# Patient Record
Sex: Male | Born: 1935 | Race: White | Hispanic: No | Smoking: Former smoker
Health system: Southern US, Community
[De-identification: ages and names within clinical notes are randomized; demographics above are authoritative.]

## PROBLEM LIST (undated history)

## (undated) DIAGNOSIS — Z87898 Personal history of other specified conditions: Secondary | ICD-10-CM

## (undated) DIAGNOSIS — Z8701 Personal history of pneumonia (recurrent): Secondary | ICD-10-CM

## (undated) DIAGNOSIS — Z8709 Personal history of other diseases of the respiratory system: Secondary | ICD-10-CM

## (undated) HISTORY — DX: Personal history of other diseases of the respiratory system: Z87.09

## (undated) HISTORY — DX: Personal history of other specified conditions: Z87.898

## (undated) HISTORY — PX: RIGHT HEART CATHETERIZATION WITH ADENOSINE STUDY: SHX6076

## (undated) HISTORY — PX: APPENDECTOMY: SHX54

## (undated) HISTORY — PX: HERNIA REPAIR: SHX51

## (undated) HISTORY — PX: CATARACT EXTRACTION: SUR2

## (undated) HISTORY — PX: UPPER GI ENDOSCOPY: SHX6162

## (undated) HISTORY — PX: COLONOSCOPY: SHX174

## (undated) HISTORY — DX: Personal history of pneumonia (recurrent): Z87.01

---

## 1999-05-25 DIAGNOSIS — W19XXXA Unspecified fall, initial encounter: Secondary | ICD-10-CM

## 1999-05-25 HISTORY — DX: Unspecified fall, initial encounter: W19.XXXA

## 2000-06-11 LAB — HM CT VIRTUAL COLONOSCOPY

## 2001-03-14 DIAGNOSIS — E7800 Pure hypercholesterolemia, unspecified: Secondary | ICD-10-CM

## 2001-03-14 HISTORY — DX: Pure hypercholesterolemia, unspecified: E78.00

## 2003-02-14 DIAGNOSIS — I10 Essential (primary) hypertension: Secondary | ICD-10-CM

## 2003-02-14 HISTORY — DX: Essential (primary) hypertension: I10

## 2003-04-19 DIAGNOSIS — I739 Peripheral vascular disease, unspecified: Secondary | ICD-10-CM | POA: Insufficient documentation

## 2003-08-06 DIAGNOSIS — I499 Cardiac arrhythmia, unspecified: Secondary | ICD-10-CM

## 2003-08-06 HISTORY — DX: Cardiac arrhythmia, unspecified: I49.9

## 2003-08-13 DIAGNOSIS — G4730 Sleep apnea, unspecified: Secondary | ICD-10-CM

## 2003-08-13 DIAGNOSIS — G4733 Obstructive sleep apnea (adult) (pediatric): Secondary | ICD-10-CM | POA: Insufficient documentation

## 2003-08-13 HISTORY — DX: Sleep apnea, unspecified: G47.30

## 2004-12-08 DIAGNOSIS — C4490 Unspecified malignant neoplasm of skin, unspecified: Secondary | ICD-10-CM | POA: Insufficient documentation

## 2004-12-29 DIAGNOSIS — L409 Psoriasis, unspecified: Secondary | ICD-10-CM

## 2004-12-29 HISTORY — DX: Psoriasis, unspecified: L40.9

## 2005-03-19 DIAGNOSIS — N289 Disorder of kidney and ureter, unspecified: Secondary | ICD-10-CM

## 2005-03-19 HISTORY — DX: Disorder of kidney and ureter, unspecified: N28.9

## 2015-10-24 DIAGNOSIS — M19019 Primary osteoarthritis, unspecified shoulder: Secondary | ICD-10-CM

## 2015-10-24 HISTORY — DX: Primary osteoarthritis, unspecified shoulder: M19.019

## 2017-08-06 DIAGNOSIS — J320 Chronic maxillary sinusitis: Secondary | ICD-10-CM

## 2017-08-06 HISTORY — DX: Chronic maxillary sinusitis: J32.0

## 2019-05-15 DIAGNOSIS — G3184 Mild cognitive impairment, so stated: Secondary | ICD-10-CM

## 2019-05-15 HISTORY — DX: Mild cognitive impairment of uncertain or unknown etiology: G31.84

## 2019-05-25 DIAGNOSIS — D62 Acute posthemorrhagic anemia: Secondary | ICD-10-CM

## 2019-05-25 HISTORY — DX: Acute posthemorrhagic anemia: D62

## 2019-05-30 DIAGNOSIS — I255 Ischemic cardiomyopathy: Secondary | ICD-10-CM

## 2019-05-30 DIAGNOSIS — R55 Syncope and collapse: Secondary | ICD-10-CM

## 2019-05-30 HISTORY — DX: Syncope and collapse: R55

## 2019-05-30 HISTORY — DX: Ischemic cardiomyopathy: I25.5

## 2019-06-03 DIAGNOSIS — I214 Non-ST elevation (NSTEMI) myocardial infarction: Secondary | ICD-10-CM | POA: Insufficient documentation

## 2019-06-03 HISTORY — DX: Non-ST elevation (NSTEMI) myocardial infarction: I21.4

## 2019-07-27 DIAGNOSIS — I2583 Coronary atherosclerosis due to lipid rich plaque: Secondary | ICD-10-CM

## 2019-07-27 DIAGNOSIS — I2510 Atherosclerotic heart disease of native coronary artery without angina pectoris: Secondary | ICD-10-CM

## 2019-07-27 HISTORY — DX: Coronary atherosclerosis due to lipid rich plaque: I25.83

## 2019-07-27 HISTORY — DX: Atherosclerotic heart disease of native coronary artery without angina pectoris: I25.10

## 2019-11-02 DIAGNOSIS — Z85828 Personal history of other malignant neoplasm of skin: Secondary | ICD-10-CM

## 2019-11-02 HISTORY — DX: Personal history of other malignant neoplasm of skin: Z85.828

## 2020-04-30 DIAGNOSIS — R21 Rash and other nonspecific skin eruption: Secondary | ICD-10-CM

## 2020-04-30 HISTORY — DX: Rash and other nonspecific skin eruption: R21

## 2020-08-07 DIAGNOSIS — K222 Esophageal obstruction: Secondary | ICD-10-CM

## 2020-08-07 HISTORY — DX: Esophageal obstruction: K22.2

## 2020-09-26 DIAGNOSIS — R6520 Severe sepsis without septic shock: Secondary | ICD-10-CM | POA: Insufficient documentation

## 2020-09-26 DIAGNOSIS — D649 Anemia, unspecified: Secondary | ICD-10-CM

## 2020-09-26 DIAGNOSIS — A419 Sepsis, unspecified organism: Secondary | ICD-10-CM | POA: Insufficient documentation

## 2020-09-26 DIAGNOSIS — J9601 Acute respiratory failure with hypoxia: Secondary | ICD-10-CM

## 2020-09-26 HISTORY — DX: Anemia, unspecified: D64.9

## 2020-09-26 HISTORY — DX: Sepsis, unspecified organism: A41.9

## 2020-09-26 HISTORY — DX: Acute respiratory failure with hypoxia: J96.01

## 2020-09-26 HISTORY — DX: Sepsis, unspecified organism: R65.20

## 2020-11-08 ENCOUNTER — Ambulatory Visit: Payer: Self-pay | Admitting: Orthopedic Surgery

## 2020-11-12 ENCOUNTER — Ambulatory Visit: Payer: Medicare Other | Admitting: Orthopedic Surgery

## 2020-11-12 ENCOUNTER — Other Ambulatory Visit: Payer: Self-pay

## 2020-11-12 ENCOUNTER — Encounter: Payer: Self-pay | Admitting: Orthopedic Surgery

## 2020-11-12 ENCOUNTER — Ambulatory Visit (INDEPENDENT_AMBULATORY_CARE_PROVIDER_SITE_OTHER): Payer: Medicare Other | Admitting: Orthopedic Surgery

## 2020-11-12 VITALS — BP 130/50 | HR 62 | Temp 97.5°F | Resp 20 | Ht 68.0 in | Wt 172.0 lb

## 2020-11-12 DIAGNOSIS — D649 Anemia, unspecified: Secondary | ICD-10-CM | POA: Diagnosis not present

## 2020-11-12 DIAGNOSIS — R748 Abnormal levels of other serum enzymes: Secondary | ICD-10-CM

## 2020-11-12 DIAGNOSIS — D500 Iron deficiency anemia secondary to blood loss (chronic): Secondary | ICD-10-CM

## 2020-11-12 DIAGNOSIS — L2084 Intrinsic (allergic) eczema: Secondary | ICD-10-CM

## 2020-11-12 DIAGNOSIS — I2510 Atherosclerotic heart disease of native coronary artery without angina pectoris: Secondary | ICD-10-CM | POA: Diagnosis not present

## 2020-11-12 DIAGNOSIS — C4441 Basal cell carcinoma of skin of scalp and neck: Secondary | ICD-10-CM

## 2020-11-12 DIAGNOSIS — I2583 Coronary atherosclerosis due to lipid rich plaque: Secondary | ICD-10-CM

## 2020-11-12 DIAGNOSIS — I4891 Unspecified atrial fibrillation: Secondary | ICD-10-CM

## 2020-11-12 DIAGNOSIS — E039 Hypothyroidism, unspecified: Secondary | ICD-10-CM

## 2020-11-12 DIAGNOSIS — R351 Nocturia: Secondary | ICD-10-CM

## 2020-11-12 DIAGNOSIS — I10 Essential (primary) hypertension: Secondary | ICD-10-CM | POA: Diagnosis not present

## 2020-11-12 DIAGNOSIS — G4701 Insomnia due to medical condition: Secondary | ICD-10-CM

## 2020-11-12 DIAGNOSIS — N401 Enlarged prostate with lower urinary tract symptoms: Secondary | ICD-10-CM

## 2020-11-12 DIAGNOSIS — G3184 Mild cognitive impairment, so stated: Secondary | ICD-10-CM

## 2020-11-12 DIAGNOSIS — I110 Hypertensive heart disease with heart failure: Secondary | ICD-10-CM

## 2020-11-12 DIAGNOSIS — R2681 Unsteadiness on feet: Secondary | ICD-10-CM

## 2020-11-12 NOTE — Progress Notes (Signed)
Careteam: Patient Care Team: Elmon Else, MD as Consulting Physician (Dermatology) Little Ishikawa, MD as Consulting Physician (Cardiology)  Seen by: Hazle Nordmann, AGNP-C  PLACE OF SERVICE:  [facility]  Advanced Directive information    Allergies  Allergen Reactions  . Lisinopril Swelling  . Telmisartan Hives  . Atorvastatin Rash  . Sulfa Antibiotics Rash    No chief complaint on file.    HPI: Patient is a 87 y.o. male seen today to establish at [facility]. Records have been requested from previous PCP.   Currently lives at [facility]. Moved here from [place], [place] two weeks prior. Lived in [place] his whole life. Retired Teacher, early years/pre. Widowed ten years ago.Two children, son and daughter. Daughter lives close, son out of state.   NSTEMI with acute decompensated heart failure- Hospitalized at [facility] 12/22 for acute decompensated heart failure and acute hypoxic respiratory failure. He began having chest pain 2 days prior to hospitalization. Daughter states his poor kidney function contributed to heart failure. He was discharged to snf 01/14 in [place]. Remains on Plavix, statin, beta blocker, and arb. Receiving 40 mg lasix for chf. New cardiologist is Dr. Nathaniel Man with [facility]. Will f/u with him in a few weeks. Denies chest pain or sob. Weighs himself daily (average weight 170 lbs). Limits sodium in diet-[facility] serves his meals.   CAD- followed by Dr. Clarene Critchley. 05/2019 he had stents placed to LAD and distal circumflex. EF 45 % after stent placement.   Hypertension- Denies dizziness or blurred vision. Checks blood pressure on his own, [employer] also checks .    Mild Cognitive impairment- Has trouble recalling words or answers. Describes it as delayed answering. He can eventually state the correct answer, but it can be up to a day later. No behavioral issues. [facility] now gives him his medicines and prepares  his meals.   Basal cell carcinoma- had areas on his forehead frozen off within the last few weeks. No complications, healing well. Scheduled to see new dermatologist, Dr. Elmon Else end of Feb.   Anemia- diagnosed during hospitalization, was found to be 7.5 on admission. He was given 2 PRBC and hgb increased to 9.4 at discharge. No recent bleeding, continues to be on plavix and eliquis.   GERD- no heartburn. Takes protonix daily. Avoids food triggers when he can.   Dysphagia- He disagrees to this diagnosis. He has never aspirated. States he has trouble swallowing 8-10 tablets all at once. No issues eating or drinking fluids. Remains on a regular diet with thin liquids.   Falls- unsteady gait. Uses a rollator. Initially started using walker in 05/2019 after first heart attack and catherization. Last fall 3 weeks ago. Stumbled against side of stove and bruised rib cage. Alarms throughout apartment and he also wears a life alert.   Constipation- mostly going everyday. Straining at times. Requesting colace.   Eczema- has had it for years. Mainly on his arms and legs. Applies lotion daily. Also used triamcinolone and halobetasol creams. Requesting interventions to help with skin.   Knee and shoulder pain- Right and left shoulder pain ongoing for years. Has seen orthopedist in past and they recommended cortisone injections and surgery. Does not want orthopedic surgery. He plans to just deal with the pain. PT eval to be done this week. Takes tylenol prn for pain. Does not think it is effective.   Hypothyroidism- stable with medication. Denies fatigue.   Insomnia- Goes to bed  around 1030 then wakes up about 2 hours later. He will play solitaire cards for awhile and eventually falls back asleep. Unsure if bedtime urination is waking him up, admits to nocturia. He has a history of BPH.   BPH- Frequent urination throughout the day. Takes saw palmetto. Admits to nocturia, cannot recall how many times a  night.   Schatzki's ring- In fall of 2021, upper endoscopy confirmed diagnosis. Hematology consult recommended.   Anemia- history of chronic GI blood loss. Takes iron supplement daily. Hematology consult recommended 10/21.   Colonscopy - last done in 07/2020  Eye exam- Due to have his eyes checked. Wears glasses. Cataracts surgery in past  Stopped driving after last hospitalization.   Dental exam- Denies dental issues. Gets regular checkups/cleanings.   No hearing issues     Review of Systems:  Review of Systems  Constitutional: Positive for malaise/fatigue and weight loss.       Cold intolerance  HENT:       Allergies, loss of smell  Eyes:       Glasses  Respiratory: Positive for shortness of breath. Negative for cough and wheezing.   Cardiovascular: Negative for chest pain and leg swelling.  Gastrointestinal: Positive for constipation and melena.  Genitourinary: Positive for frequency.  Musculoskeletal: Positive for falls, joint pain and myalgias.  Skin: Positive for itching and rash.  Neurological: Positive for weakness. Negative for dizziness and headaches.       Loss of balance  Endo/Heme/Allergies: Bruises/bleeds easily.  Psychiatric/Behavioral: Positive for memory loss.    Past Medical History:  Diagnosis Date  . Acute anemia 09/26/2020  . Anemia due to acute blood loss 05/25/2019  . Chronic maxillary sinusitis 08/06/2017  . Coronary artery disease due to lipid rich plaque 07/27/2019  . Essential hypertension 02/14/2003  . Fainting 05/30/2019  . Fall 05/25/1999  . High cholesterol 03/14/2001  . History of basal cell carcinoma 11/02/2019  . History of pneumonia   . History of snoring   . History of upper respiratory infection   . Irregular heartbeat 08/06/2003  . Ischemic cardiomyopathy 05/30/2019  . Mild cognitive impairment 05/15/2019  . NSTEMI (non-ST elevated myocardial infarction) ([place]) 06/03/2019  . Poor renal function 03/19/2005  . Primary  osteoarthritis of shoulder 10/24/2015  . Psoriasis 12/29/2004  . Rash and nonspecific skin eruption 04/30/2020  . Schatzki's ring 08/07/2020  . Sepsis with acute hypoxic respiratory failure ([place]) 09/26/2020  . Sleep apnea 08/13/2003   No past surgical history on file. Social History:   reports that he quit smoking about 61 years ago. His smoking use included cigarettes. He smoked 0.25 packs per day. He has never used smokeless tobacco. He reports current alcohol use of about 3.0 standard drinks of alcohol per week. He reports that he does not use drugs.  Family History  Problem Relation Age of Onset  . Lung disease Mother   . Heart attack Father   . Brain cancer Brother   . Breast cancer Daughter   . Celiac disease Daughter   . Diabetes Mellitus I Son     Medications: Patient's Medications  New Prescriptions   No medications on file  Previous Medications   ACETAMINOPHEN (TYLENOL) 500 MG TABLET    Take 500 mg by mouth every 6 (six) hours as needed.   ALBUTEROL (VENTOLIN HFA) 108 (90 BASE) MCG/ACT INHALER    Inhale 2 puffs into the lungs every 6 (six) hours as needed for wheezing or shortness of breath.   AMIODARONE (PACERONE)  200 MG TABLET    Take 200 mg by mouth daily.   APIXABAN (ELIQUIS) 5 MG TABS TABLET    Take 5 mg by mouth 2 (two) times daily.   BENZONATATE (TESSALON) 100 MG CAPSULE    Take by mouth 3 (three) times daily as needed for cough.   CALCIUM CITRATE PO    Take 1 tablet by mouth 2 (two) times daily.   CLOPIDOGREL (PLAVIX) 75 MG TABLET    Take 75 mg by mouth daily.   CYPROHEPTADINE (PERIACTIN) 4 MG TABLET    Take 4 mg by mouth in the morning, at noon, and at bedtime.   FERROUS SULFATE 325 (65 FE) MG TABLET    Take 325 mg by mouth daily.   FEXOFENADINE-PSEUDOEPHEDRINE (ALLEGRA-D 24) 180-240 MG 24 HR TABLET    Take 1 tablet by mouth daily as needed.   FLUTICASONE (FLONASE) 50 MCG/ACT NASAL SPRAY    Place 2 sprays into both nostrils 2 (two) times daily.   FLUTICASONE  FUROATE-VILANTEROL (BREO ELLIPTA) 100-25 MCG/INH AEPB    Inhale 1 puff into the lungs daily.   FUROSEMIDE (LASIX) 40 MG TABLET    Take 40 mg by mouth in the morning.   HALOBETASOL (ULTRAVATE) 0.05 % CREAM    Apply 1 application topically 2 (two) times daily.   IPRATROPIUM (ATROVENT HFA) 17 MCG/ACT INHALER    Inhale 2 puffs into the lungs in the morning, at noon, in the evening, and at bedtime.   LEVOTHYROXINE (SYNTHROID) 25 MCG TABLET    Take 25 mcg by mouth daily.   LOSARTAN (COZAAR) 50 MG TABLET    Take 50 mg by mouth in the morning.   METOPROLOL SUCCINATE (TOPROL-XL) 50 MG 24 HR TABLET    Take 50 mg by mouth daily. Take with or immediately following a meal.   MONTELUKAST (SINGULAIR) 10 MG TABLET    Take 10 mg by mouth daily.   MULTIPLE VITAMIN (MULTIVITAMIN PO)    Take 1 tablet by mouth daily.   PANTOPRAZOLE (PROTONIX) 40 MG TABLET    Take 40 mg by mouth daily.   ROSUVASTATIN (CRESTOR) 20 MG TABLET    Take 20 mg by mouth daily.   SAW PALMETTO, SERENOA REPENS, (SAW PALMETTO FRUIT PO)    Take 450 mg by mouth 2 (two) times daily.   TRIAMCINOLONE (KENALOG) 0.1 %    Apply 1 application topically as needed.  Modified Medications   No medications on file  Discontinued Medications   No medications on file    Physical Exam:  There were no vitals filed for this visit. There is no height or weight on file to calculate BMI. Wt Readings from Last 3 Encounters:  No data found for Wt    Physical Exam Vitals reviewed.  Constitutional:      General: He is not in acute distress. HENT:     Head: Normocephalic.  Cardiovascular:     Rate and Rhythm: Normal rate and regular rhythm.     Pulses: Normal pulses.     Heart sounds: Normal heart sounds. No murmur heard.   Pulmonary:     Effort: Pulmonary effort is normal. No respiratory distress.     Breath sounds: Normal breath sounds. No wheezing.  Abdominal:     General: Abdomen is flat. Bowel sounds are normal.     Palpations: Abdomen is soft.   Musculoskeletal:     Cervical back: Normal range of motion.     Right lower leg: Edema present.  Left lower leg: Edema present.     Comments: Non-pitting  Lymphadenopathy:     Cervical: No cervical adenopathy.  Skin:    General: Skin is warm and dry.     Capillary Refill: Capillary refill takes less than 2 seconds.  Neurological:     General: No focal deficit present.     Mental Status: He is alert and oriented to person, place, and time.     Motor: Weakness present.     Gait: Gait abnormal.     Comments: walker  Psychiatric:        Mood and Affect: Mood normal.        Behavior: Behavior normal.    Labs reviewed: Basic Metabolic Panel: No results for input(s): NA, K, CL, CO2, GLUCOSE, BUN, CREATININE, CALCIUM, MG, PHOS, TSH in the last 8760 hours. Liver Function Tests: No results for input(s): AST, ALT, ALKPHOS, BILITOT, PROT, ALBUMIN in the last 8760 hours. No results for input(s): LIPASE, AMYLASE in the last 8760 hours. No results for input(s): AMMONIA in the last 8760 hours. CBC: No results for input(s): WBC, NEUTROABS, HGB, HCT, MCV, PLT in the last 8760 hours. Lipid Panel: No results for input(s): CHOL, HDL, LDLCALC, TRIG, CHOLHDL, LDLDIRECT in the last 8760 hours. TSH: No results for input(s): TSH in the last 8760 hours. A1C: No results found for: HGBA1C   Assessment/Plan 1. Acquired hypothyroidism - TSH 14.35 - levothyroxine increased to 50 mcg po daily on empty stomach - recheck TSH in 4-6 weeks  2. Low hemoglobin - improving after hospitalization, history of GI blood loss - CBC with Differential/Platelets- resulted 10.9 - recheck in 4 weeks  3. Primary hypertension - bp at goal  - continue losartan 50 mg po daily - continue heart healthy diet - CMP   4. Coronary artery disease due to lipid rich plaque - history of stent placement 05/2019 - last echo with EF 45% - cont metoprolol and statin  5. Mild cognitive impairment - alert and oriented x  4 today - reports slow responsiveness - MMSE next visit  6. Basal cell carcinoma (BCC) of scalp - lesions on scale removed 04/2020 - cont to monitor  7. Iron deficiency anemia due to chronic blood loss - hgb 10.9 today - cbc/diff in 4 weeks  8. Unsteady gait - continue to walk with rollator - continue PT/OT  9. Atrial fibrillation, unspecified type ([place]) - rate controlled - continue metoprolol and amiodarone - continue eliquis for dvt prophylaxis  10. Hypertensive heart disease with heart failure (HCC) - weight stable, no sob, BLE non-pitting edema - continue lasix  - continue daily weights  11. Insomnia due to medical condition - history of BPH - advised cutting back on fluids 2 hours before bedtime  12. Benign prostatic hyperplasia with nocturia - few episodes of nocturia at night - advised cutting back on fluids prior to bedtime - no urinary retention - continue saw palmetto  13. Intrinsic eczema - U/V treatment 09/2020 - continue halobetasol and triamcinolone - advised to moisturize after showering, suggested cerave - may apply vasaline over moisturizer  - discontinued Allegra D due to cardiac history - continue allegra for itching - scheduled to see dermatologist in 1-2 months per daughter   I provided 65 minutes of face-to-face time during this encounter.      Next appt: 12/10/2020 Windell Moulding, [facility] [phone]

## 2020-11-13 ENCOUNTER — Other Ambulatory Visit: Payer: Self-pay | Admitting: Orthopedic Surgery

## 2020-11-13 DIAGNOSIS — E039 Hypothyroidism, unspecified: Secondary | ICD-10-CM

## 2020-11-13 DIAGNOSIS — N1831 Chronic kidney disease, stage 3a: Secondary | ICD-10-CM

## 2020-11-13 LAB — CBC WITH DIFFERENTIAL/PLATELET
Absolute Monocytes: 984 cells/uL — ABNORMAL HIGH (ref 200–950)
Basophils Absolute: 75 cells/uL (ref 0–200)
Basophils Relative: 0.7 %
Eosinophils Absolute: 1113 cells/uL — ABNORMAL HIGH (ref 15–500)
Eosinophils Relative: 10.4 %
HCT: 33.1 % — ABNORMAL LOW (ref 38.5–50.0)
Hemoglobin: 10.9 g/dL — ABNORMAL LOW (ref 13.2–17.1)
Lymphs Abs: 1766 cells/uL (ref 850–3900)
MCH: 32.4 pg (ref 27.0–33.0)
MCHC: 32.9 g/dL (ref 32.0–36.0)
MCV: 98.5 fL (ref 80.0–100.0)
MPV: 10.3 fL (ref 7.5–12.5)
Monocytes Relative: 9.2 %
Neutro Abs: 6762 cells/uL (ref 1500–7800)
Neutrophils Relative %: 63.2 %
Platelets: 325 10*3/uL (ref 140–400)
RBC: 3.36 10*6/uL — ABNORMAL LOW (ref 4.20–5.80)
RDW: 15.6 % — ABNORMAL HIGH (ref 11.0–15.0)
Total Lymphocyte: 16.5 %
WBC: 10.7 10*3/uL (ref 3.8–10.8)

## 2020-11-13 LAB — COMPREHENSIVE METABOLIC PANEL
AG Ratio: 1.4 (calc) (ref 1.0–2.5)
ALT: 13 U/L (ref 9–46)
AST: 16 U/L (ref 10–35)
Albumin: 4 g/dL (ref 3.6–5.1)
Alkaline phosphatase (APISO): 37 U/L (ref 35–144)
BUN/Creatinine Ratio: 10 (calc) (ref 6–22)
BUN: 16 mg/dL (ref 7–25)
CO2: 32 mmol/L (ref 20–32)
Calcium: 10 mg/dL (ref 8.6–10.3)
Chloride: 103 mmol/L (ref 98–110)
Creat: 1.6 mg/dL — ABNORMAL HIGH (ref 0.70–1.11)
Globulin: 2.8 g/dL (calc) (ref 1.9–3.7)
Glucose, Bld: 91 mg/dL (ref 65–99)
Potassium: 4.6 mmol/L (ref 3.5–5.3)
Sodium: 144 mmol/L (ref 135–146)
Total Bilirubin: 0.4 mg/dL (ref 0.2–1.2)
Total Protein: 6.8 g/dL (ref 6.1–8.1)

## 2020-11-13 LAB — TSH: TSH: 14.35 mIU/L — ABNORMAL HIGH (ref 0.40–4.50)

## 2020-11-13 MED ORDER — LEVOTHYROXINE SODIUM 50 MCG PO TABS: 50.0000 ug | ORAL_TABLET | Freq: Every day | ORAL | 0 refills | Status: DC

## 2020-11-17 DIAGNOSIS — I4891 Unspecified atrial fibrillation: Secondary | ICD-10-CM | POA: Insufficient documentation

## 2020-11-17 DIAGNOSIS — D500 Iron deficiency anemia secondary to blood loss (chronic): Secondary | ICD-10-CM | POA: Insufficient documentation

## 2020-11-17 DIAGNOSIS — D649 Anemia, unspecified: Secondary | ICD-10-CM | POA: Insufficient documentation

## 2020-11-17 DIAGNOSIS — E039 Hypothyroidism, unspecified: Secondary | ICD-10-CM | POA: Insufficient documentation

## 2020-11-17 DIAGNOSIS — G3184 Mild cognitive impairment, so stated: Secondary | ICD-10-CM | POA: Insufficient documentation

## 2020-11-17 DIAGNOSIS — I10 Essential (primary) hypertension: Secondary | ICD-10-CM | POA: Insufficient documentation

## 2020-11-17 DIAGNOSIS — N401 Enlarged prostate with lower urinary tract symptoms: Secondary | ICD-10-CM | POA: Insufficient documentation

## 2020-11-17 DIAGNOSIS — L2084 Intrinsic (allergic) eczema: Secondary | ICD-10-CM | POA: Insufficient documentation

## 2020-11-17 DIAGNOSIS — C4441 Basal cell carcinoma of skin of scalp and neck: Secondary | ICD-10-CM | POA: Insufficient documentation

## 2020-11-17 DIAGNOSIS — G4701 Insomnia due to medical condition: Secondary | ICD-10-CM | POA: Insufficient documentation

## 2020-11-17 DIAGNOSIS — R2681 Unsteadiness on feet: Secondary | ICD-10-CM | POA: Insufficient documentation

## 2020-11-17 DIAGNOSIS — I2510 Atherosclerotic heart disease of native coronary artery without angina pectoris: Secondary | ICD-10-CM | POA: Insufficient documentation

## 2020-11-17 DIAGNOSIS — I110 Hypertensive heart disease with heart failure: Secondary | ICD-10-CM | POA: Insufficient documentation

## 2020-11-17 DIAGNOSIS — I2583 Coronary atherosclerosis due to lipid rich plaque: Secondary | ICD-10-CM | POA: Insufficient documentation

## 2020-11-17 DIAGNOSIS — R351 Nocturia: Secondary | ICD-10-CM | POA: Insufficient documentation

## 2020-11-24 NOTE — Progress Notes (Signed)
Cardiology Office Note:    Date:  11/26/2020   ID:  [name], DOB [birth year], MRN [id number]  PCP:  No primary care provider on file.  Cardiologist:  No primary care provider on file.  Electrophysiologist:  None   Referring MD: Harriet Masson, DO   Chief Complaint  Patient presents with  . Coronary Artery Disease    History of Present Illness:    Ronnie Joseph is a 87 y.o. male with a hx of CAD status post PCI to LAD and LCx in 05/2019, ischemic cardiomyopathy (EF 25% post PCI, improved to 35-40 % on most recent echo), atrial fibrillation on Eliquis who presents for an initial visit.  His previous cardiologist was [name] in [facility].  Echocardiogram 09/26/2020 in [place] showed LVEF 35 to 40%, mildly reduced RV systolic function.  Cardiac catheterization on 06/01/2019 showed tandem diffuse 90% stenosis of mid LAD status post DES, 95% distal LCx stenosis status post DES.  Echocardiogram on 05/26/2019 showed LVEF 25 to 30%.  He was admitted from 12/16 through 10/02/20 with NSTEMI in [place].  He presented to ED with chest pain.  Was also found to have fever to 100.8, lactic acid of 12, hemoglobin 7.5.  Troponin peaked at 8300.  He received 2 units PRBCs.  He was evaluated by cardiology, who recommended medical management.  He has a history of GI bleeds with AVMs.  Underwent EGD/colonoscopy on 09/30/20 which showed duodenal AVMs status post APC and colonoscopy showed ascending colon AVM status post APC.  During admission was tolerating Plavix and apixaban without any further bleeding.  He was started on amiodarone for his atrial fibrillation, with plans for amiodarone 200 mg twice daily x1 month then 200 mg daily.  He recently moved to [place] from [place] to be close to his daughter.  Reports he continues to have black stools but has been taking iron supplements.  He denies any chest pain since discharge from hospital.  Denies any shortness of breath,  lightheadedness, syncope, or palpitations.  Reports he has not been weighing himself daily but weight has been stable when he checks.  Does report he has had unsteadiness on his feet and has had some falls.   Wt Readings from Last 3 Encounters:  11/26/20 174 lb 12.8 oz (79.3 kg)  11/12/20 172 lb (78 kg)    Past Medical History:  Diagnosis Date  . Acute anemia 09/26/2020  . Anemia due to acute blood loss 05/25/2019  . Chronic maxillary sinusitis 08/06/2017  . Coronary artery disease due to lipid rich plaque 07/27/2019  . Essential hypertension 02/14/2003  . Fainting 05/30/2019  . Fall 05/25/1999  . High cholesterol 03/14/2001  . History of basal cell carcinoma 11/02/2019  . History of pneumonia   . History of snoring   . History of upper respiratory infection   . Irregular heartbeat 08/06/2003  . Ischemic cardiomyopathy 05/30/2019  . Mild cognitive impairment 05/15/2019  . NSTEMI (non-ST elevated myocardial infarction) ([place]) 06/03/2019  . Poor renal function 03/19/2005  . Primary osteoarthritis of shoulder 10/24/2015  . Psoriasis 12/29/2004  . Rash and nonspecific skin eruption 04/30/2020  . Schatzki's ring 08/07/2020  . Sepsis with acute hypoxic respiratory failure ([place]) 09/26/2020  . Sleep apnea 08/13/2003    Past Surgical History:  Procedure Laterality Date  . APPENDECTOMY    . CATARACT EXTRACTION    . COLONOSCOPY    . HERNIA REPAIR    . RIGHT HEART CATHETERIZATION WITH ADENOSINE STUDY    .  UPPER GI ENDOSCOPY      Current Medications: Current Meds  Medication Sig  . acetaminophen (TYLENOL) 500 MG tablet Take 500 mg by mouth every 6 (six) hours as needed.  Marland Kitchen albuterol (VENTOLIN HFA) 108 (90 Base) MCG/ACT inhaler Inhale 2 puffs into the lungs every 6 (six) hours as needed for wheezing or shortness of breath.  Marland Kitchen amiodarone (PACERONE) 200 MG tablet Take 200 mg by mouth daily.  Marland Kitchen apixaban (ELIQUIS) 5 MG TABS tablet Take 5 mg by mouth 2 (two) times daily.  .  benzonatate (TESSALON) 100 MG capsule Take by mouth 3 (three) times daily as needed for cough.  Marland Kitchen CALCIUM CITRATE PO Take 1 tablet by mouth 2 (two) times daily.  . clopidogrel (PLAVIX) 75 MG tablet Take 75 mg by mouth daily.  . cyproheptadine (PERIACTIN) 4 MG tablet Take 4 mg by mouth in the morning, at noon, and at bedtime.  . ferrous sulfate 325 (65 FE) MG tablet Take 325 mg by mouth daily.  Marland Kitchen Fexofenadine HCl (ALLEGRA ALLERGY PO) Take 1 tablet by mouth every morning.  . fluticasone (FLONASE) 50 MCG/ACT nasal spray Place 2 sprays into both nostrils 2 (two) times daily.  . fluticasone furoate-vilanterol (BREO ELLIPTA) 100-25 MCG/INH AEPB Inhale 1 puff into the lungs daily.  . furosemide (LASIX) 40 MG tablet Take 40 mg by mouth in the morning.  . halobetasol (ULTRAVATE) 0.05 % cream Apply 1 application topically 2 (two) times daily.  Marland Kitchen ipratropium (ATROVENT HFA) 17 MCG/ACT inhaler Inhale 2 puffs into the lungs in the morning, at noon, in the evening, and at bedtime.  Marland Kitchen levothyroxine (SYNTHROID) 50 MCG tablet Take 1 tablet (50 mcg total) by mouth daily.  Marland Kitchen losartan (COZAAR) 50 MG tablet Take 50 mg by mouth in the morning.  . metoprolol succinate (TOPROL-XL) 50 MG 24 hr tablet Take 50 mg by mouth daily. Take with or immediately following a meal.  . montelukast (SINGULAIR) 10 MG tablet Take 10 mg by mouth daily.  . Multiple Vitamin (MULTIVITAMIN PO) Take 1 tablet by mouth daily.  . pantoprazole (PROTONIX) 40 MG tablet Take 40 mg by mouth daily.  . rosuvastatin (CRESTOR) 20 MG tablet Take 20 mg by mouth daily.  . Saw Palmetto, Serenoa repens, (SAW PALMETTO FRUIT PO) Take 450 mg by mouth 2 (two) times daily.  Marland Kitchen triamcinolone (KENALOG) 0.1 % Apply 1 application topically as needed.     Allergies:   Lisinopril, Telmisartan, Atorvastatin, and Sulfa antibiotics   Social History   Socioeconomic History  . Marital status: Widowed    Spouse name: Not on file  . Number of children: Not on file  .  Years of education: Not on file  . Highest education level: Not on file  Occupational History  . Not on file  Tobacco Use  . Smoking status: Former Smoker    Packs/day: 0.25    Types: Cigarettes    Quit date: 12/17/1958    Years since quitting: 61.9  . Smokeless tobacco: Never Used  Vaping Use  . Vaping Use: Never used  Substance and Sexual Activity  . Alcohol use: Yes    Alcohol/week: 3.0 standard drinks    Types: 3 Standard drinks or equivalent per week  . Drug use: Never  . Sexual activity: Not Currently  Other Topics Concern  . Not on file  Social History Narrative   Tobacco use, amount per day now: 0   Past tobacco use, amount per day: Less than 1 pack  How many years did you use tobacco: 5 years, stopped in 1960   Alcohol use (drinks per week): 4   Diet: N/A   Do you drink/eat things with caffeine: Yes.   Marital status: Widowed                                  What year were you married? 1960   Do you live in a house, apartment, assisted living, condo, trailer, etc.? Assisted Living.   Is it one or more stories? 1    How many persons live in your home? 1   Do you have pets in your home?( please list) No.   Highest Level of education completed? College   Current or past profession: Pharmacist   Do you exercise?  No.                                Type and how often?   Do you have a living will? Yes.   Do you have a DNR form?  Yes.                                 If not, do you want to discuss one?   Do you have signed POA/HPOA forms? Yes.                       If so, please bring to you appointment      Do you have any difficulty bathing or dressing yourself? Yes.   Do you have any difficulty preparing food or eating? Yes.   Do you have any difficulty managing your medications? Yes.   Do you have any difficulty managing your finances? No.   Do you have any difficulty affording your medications? No.   Social Determinants of Health   Financial Resource Strain: Not  on file  Food Insecurity: Not on file  Transportation Needs: Not on file  Physical Activity: Not on file  Stress: Not on file  Social Connections: Not on file     Family History: The patient's family history includes Brain cancer in his brother; Breast cancer in his daughter; Celiac disease in his daughter; Diabetes Mellitus I in his son; Heart attack in his father; Lung disease in his mother.  ROS:   Please see the history of present illness.    All other systems reviewed and are negative.  EKGs/Labs/Other Studies Reviewed:    The following studies were reviewed today:   EKG:  EKG is  ordered today.  The ekg ordered today demonstrates sinus bradycardia, rate 58, first-degree AV block, inferior Q waves, RBBB  Recent Labs: 11/12/2020: ALT 13; BUN 16; Creat 1.60; Hemoglobin 10.9; Platelets 325; Potassium 4.6; Sodium 144; TSH 14.35  Recent Lipid Panel No results found for: CHOL, TRIG, HDL, CHOLHDL, VLDL, LDLCALC, LDLDIRECT  Physical Exam:    VS:  BP (!) 118/52   Pulse (!) 58   Ht 5\' 8"  (1.727 m)   Wt 174 lb 12.8 oz (79.3 kg)   SpO2 98%   BMI 26.58 kg/m     Wt Readings from Last 3 Encounters:  11/26/20 174 lb 12.8 oz (79.3 kg)  11/12/20 172 lb (78 kg)     GEN:  in no acute distress HEENT: Normal NECK: No JVD  CARDIAC: RRR, 2/6 systolic murmur RESPIRATORY:  Clear to auscultation without rales, wheezing or rhonchi  ABDOMEN: Soft, non-tender, non-distended MUSCULOSKELETAL:  No edema; No deformity  SKIN: Warm and dry NEUROLOGIC:  Alert and oriented x 3 PSYCHIATRIC:  Normal affect   ASSESSMENT:    1. CAD in native artery   2. Chronic combined systolic and diastolic heart failure ([place])   3. Atrial fibrillation, unspecified type ([place])   4. Anemia, unspecified type   5. Essential hypertension   6. Hyperlipidemia, unspecified hyperlipidemia type    PLAN:    CAD: Cardiac catheterization on 06/01/2019 showed tandem diffuse 90% stenosis of mid LAD status post DES, 95%  distal LCx stenosis status post DES.  Admitted in [place] with NSTEMI 09/2020.  In setting of GI bleed, medical management was recommended. -On Plavix plus Eliquis.  Given has been over a year since his last stent and considering recent GI bleeding issues, recommended switching to aspirin plus Eliquis.  He declines, would prefer to stay on Plavix plus Eliquis for now -Continue rosuvastatin 20 mg daily  Chronic combined systolic and diastolic heart failure: EF 35-40% on most recent echo 09/2020.  On Toprol-XL 50 mg daily and losartan 50 mg daily. -Continue Toprol-XL 50 mg daily -Continue losartan 50 mg daily -On Lasix 40 mg daily.  He appears euvolemic.  Creatinine was 1.0 on discharge from hospital in December, notably was 1.6 on labs 2 weeks ago.  Will recheck BMP.  If creatinine remains elevated, suspect likely overdiuresis, will hold Lasix and monitor daily weights.  Advised to weigh himself daily and call if gains more than 3 pounds in 1 day or 5 pounds in 1 week  Atrial fibrillation: Started on amiodarone during recent admission.  Currently in sinus rhythm.  CHA2DS2-VASc score 5 (CHF, hypertension, age x2, CAD) -Continue amiodarone 200 mg daily -Continue Toprol-XL 50 mg daily -He is currently on Eliquis 5 mg twice daily.  With recent labs showing Cr 1.6, and considering age, would meet criteria to reduce dose to 2.5 mg BID.  Will recheck BMP.   -While he has an elevated CHA2DS2-VASc score, he is a high risk anticoagulation candidate given his history of GI bleeding and falls.  Would recommend referral to Dr. Quentin Ore for Watchman evaluation  Anemia: Recent GI bleed, has iron deficiency anemia.  Will check CBC  Hypertension: On Toprol-XL and losartan.  Appears controlled  Hyperlipidemia: On rosuvastatin 20 mg daily.  LDL 39 on 09/26/20  Hypothyroidism: TSH 14 on recent labs, levothyroxine dose increased with plans for repeat TSH/free T4 in 1 month   RTC in 6 weeks    Medication  Adjustments/Labs and Tests Ordered: Current medicines are reviewed at length with the patient today.  Concerns regarding medicines are outlined above.  Orders Placed This Encounter  Procedures  . Comprehensive metabolic panel  . CBC  . Ambulatory referral to Cardiac Electrophysiology  . EKG 12-Lead   No orders of the defined types were placed in this encounter.   Patient Instructions  Medication Instructions:  Your physician recommends that you continue on your current medications as directed. Please refer to the Current Medication list given to you today.  *If you need a refill on your cardiac medications before your next appointment, please call your pharmacy*   Lab Work: CMET, CBC today  If you have labs (blood work) drawn today and your tests are completely normal, you will receive your results only by: Marland Kitchen MyChart Message (if you have MyChart) OR .  A paper copy in the mail If you have any lab test that is abnormal or we need to change your treatment, we will call you to review the results.  Follow-Up: At [facility], you and your health needs are our priority.  As part of our continuing mission to provide you with exceptional heart care, we have created designated Provider Care Teams.  These Care Teams include your primary Cardiologist (physician) and Advanced Practice Providers (APPs -  Physician Assistants and Nurse Practitioners) who all work together to provide you with the care you need, when you need it.  We recommend signing up for the patient portal called "MyChart".  Sign up information is provided on this After Visit Summary.  MyChart is used to connect with patients for Virtual Visits (Telemedicine).  Patients are able to view lab/test results, encounter notes, upcoming appointments, etc.  Non-urgent messages can be sent to your provider as well.   To learn more about what you can do with MyChart, go to [email or web address].    Your next appointment:   6  week(s)  The format for your next appointment:   In Person  Provider:   Oswaldo Milian, MD   Other Instructions You have been referred to Dr. Leone Haven for watchman      Signed, Donato Heinz, MD  11/26/2020 6:43 PM    [place]

## 2020-11-26 ENCOUNTER — Other Ambulatory Visit: Payer: Self-pay

## 2020-11-26 ENCOUNTER — Ambulatory Visit (INDEPENDENT_AMBULATORY_CARE_PROVIDER_SITE_OTHER): Payer: Medicare Other | Admitting: Cardiology

## 2020-11-26 ENCOUNTER — Encounter: Payer: Self-pay | Admitting: Cardiology

## 2020-11-26 VITALS — BP 118/52 | HR 58 | Ht 68.0 in | Wt 174.8 lb

## 2020-11-26 DIAGNOSIS — D649 Anemia, unspecified: Secondary | ICD-10-CM | POA: Diagnosis not present

## 2020-11-26 DIAGNOSIS — E785 Hyperlipidemia, unspecified: Secondary | ICD-10-CM

## 2020-11-26 DIAGNOSIS — I5042 Chronic combined systolic (congestive) and diastolic (congestive) heart failure: Secondary | ICD-10-CM | POA: Diagnosis not present

## 2020-11-26 DIAGNOSIS — I2510 Atherosclerotic heart disease of native coronary artery without angina pectoris: Secondary | ICD-10-CM | POA: Diagnosis not present

## 2020-11-26 DIAGNOSIS — I4891 Unspecified atrial fibrillation: Secondary | ICD-10-CM

## 2020-11-26 DIAGNOSIS — I10 Essential (primary) hypertension: Secondary | ICD-10-CM

## 2020-11-26 NOTE — Patient Instructions (Signed)
Medication Instructions:  Your physician recommends that you continue on your current medications as directed. Please refer to the Current Medication list given to you today.  *If you need a refill on your cardiac medications before your next appointment, please call your pharmacy*   Lab Work: CMET, CBC today  If you have labs (blood work) drawn today and your tests are completely normal, you will receive your results only by: Marland Kitchen MyChart Message (if you have MyChart) OR . A paper copy in the mail If you have any lab test that is abnormal or we need to change your treatment, we will call you to review the results.  Follow-Up: At [facility], you and your health needs are our priority.  As part of our continuing mission to provide you with exceptional heart care, we have created designated Provider Care Teams.  These Care Teams include your primary Cardiologist (physician) and Advanced Practice Providers (APPs -  Physician Assistants and Nurse Practitioners) who all work together to provide you with the care you need, when you need it.  We recommend signing up for the patient portal called "MyChart".  Sign up information is provided on this After Visit Summary.  MyChart is used to connect with patients for Virtual Visits (Telemedicine).  Patients are able to view lab/test results, encounter notes, upcoming appointments, etc.  Non-urgent messages can be sent to your provider as well.   To learn more about what you can do with MyChart, go to [email or web address].    Your next appointment:   6 week(s)  The format for your next appointment:   In Person  Provider:   Oswaldo Milian, MD   Other Instructions You have been referred to Dr. Leone Haven for [facility]

## 2020-11-27 LAB — CBC
Hematocrit: 34.4 % — ABNORMAL LOW (ref 37.5–51.0)
Hemoglobin: 11 g/dL — ABNORMAL LOW (ref 13.0–17.7)
MCH: 32.2 pg (ref 26.6–33.0)
MCHC: 32 g/dL (ref 31.5–35.7)
MCV: 101 fL — ABNORMAL HIGH (ref 79–97)
Platelets: 348 10*3/uL (ref 150–450)
RBC: 3.42 x10E6/uL — ABNORMAL LOW (ref 4.14–5.80)
RDW: 15.7 % — ABNORMAL HIGH (ref 11.6–15.4)
WBC: 8.7 10*3/uL (ref 3.4–10.8)

## 2020-11-27 LAB — COMPREHENSIVE METABOLIC PANEL
ALT: 17 IU/L (ref 0–44)
AST: 22 IU/L (ref 0–40)
Albumin/Globulin Ratio: 1.9 (ref 1.2–2.2)
Albumin: 4.4 g/dL (ref 3.6–4.6)
Alkaline Phosphatase: 44 IU/L (ref 44–121)
BUN/Creatinine Ratio: 12 (ref 10–24)
BUN: 18 mg/dL (ref 8–27)
Bilirubin Total: 0.2 mg/dL (ref 0.0–1.2)
CO2: 24 mmol/L (ref 20–29)
Calcium: 9.6 mg/dL (ref 8.6–10.2)
Chloride: 100 mmol/L (ref 96–106)
Creatinine, Ser: 1.45 mg/dL — ABNORMAL HIGH (ref 0.76–1.27)
GFR calc Af Amer: 50 mL/min/{1.73_m2} — ABNORMAL LOW (ref >59)
GFR calc non Af Amer: 43 mL/min/{1.73_m2} — ABNORMAL LOW (ref >59)
Globulin, Total: 2.3 g/dL (ref 1.5–4.5)
Glucose: 132 mg/dL — ABNORMAL HIGH (ref 65–99)
Potassium: 4.8 mmol/L (ref 3.5–5.2)
Sodium: 141 mmol/L (ref 134–144)
Total Protein: 6.7 g/dL (ref 6.0–8.5)

## 2020-11-28 DIAGNOSIS — I5042 Chronic combined systolic (congestive) and diastolic (congestive) heart failure: Secondary | ICD-10-CM

## 2020-11-28 NOTE — Telephone Encounter (Signed)
Spoke with pt's daughter regarding lab results. Daughter asks that we send orders over to pt's assisted living and provided the fax number for [facility].  Attempted to call nurse at facility but was only able to leave a voicemail. Letter with orders as well as lab slips faxed to facility.

## 2020-12-10 ENCOUNTER — Encounter: Payer: Self-pay | Admitting: Orthopedic Surgery

## 2020-12-10 ENCOUNTER — Other Ambulatory Visit: Payer: Self-pay

## 2020-12-10 ENCOUNTER — Ambulatory Visit (INDEPENDENT_AMBULATORY_CARE_PROVIDER_SITE_OTHER): Payer: Medicare Other | Admitting: Orthopedic Surgery

## 2020-12-10 VITALS — BP 130/66 | HR 64 | Temp 97.9°F | Resp 20 | Ht 68.0 in | Wt 172.2 lb

## 2020-12-10 DIAGNOSIS — G3184 Mild cognitive impairment, so stated: Secondary | ICD-10-CM

## 2020-12-10 DIAGNOSIS — Z13220 Encounter for screening for lipoid disorders: Secondary | ICD-10-CM

## 2020-12-10 DIAGNOSIS — D649 Anemia, unspecified: Secondary | ICD-10-CM | POA: Diagnosis not present

## 2020-12-10 DIAGNOSIS — I10 Essential (primary) hypertension: Secondary | ICD-10-CM

## 2020-12-10 DIAGNOSIS — I2510 Atherosclerotic heart disease of native coronary artery without angina pectoris: Secondary | ICD-10-CM

## 2020-12-10 DIAGNOSIS — E039 Hypothyroidism, unspecified: Secondary | ICD-10-CM | POA: Diagnosis not present

## 2020-12-10 DIAGNOSIS — D500 Iron deficiency anemia secondary to blood loss (chronic): Secondary | ICD-10-CM

## 2020-12-10 DIAGNOSIS — I2583 Coronary atherosclerosis due to lipid rich plaque: Secondary | ICD-10-CM

## 2020-12-10 DIAGNOSIS — N1831 Chronic kidney disease, stage 3a: Secondary | ICD-10-CM

## 2020-12-10 DIAGNOSIS — I5042 Chronic combined systolic (congestive) and diastolic (congestive) heart failure: Secondary | ICD-10-CM | POA: Diagnosis not present

## 2020-12-10 DIAGNOSIS — I4891 Unspecified atrial fibrillation: Secondary | ICD-10-CM

## 2020-12-10 DIAGNOSIS — R2681 Unsteadiness on feet: Secondary | ICD-10-CM

## 2020-12-10 DIAGNOSIS — C4441 Basal cell carcinoma of skin of scalp and neck: Secondary | ICD-10-CM

## 2020-12-10 DIAGNOSIS — K5901 Slow transit constipation: Secondary | ICD-10-CM

## 2020-12-10 NOTE — Progress Notes (Signed)
Careteam: Patient Care Team: [name], NP as PCP - General (Adult Health Nurse Practitioner) Jari Pigg, MD as Consulting Physician (Dermatology) Donato Heinz, MD as Consulting Physician (Cardiology)  Seen by: Windell Moulding, AGNP-C  PLACE OF SERVICE:  [place] Directive information Does Patient Have a Medical Advance Directive?: Yes, Type of Advance Directive: [place], Does patient want to make changes to medical advance directive?: No - Patient declined  Allergies  Allergen Reactions  . Lisinopril Swelling  . Telmisartan Hives  . Atorvastatin Rash  . Sulfa Antibiotics Rash    Chief Complaint  Patient presents with  . Medical Management of Chronic Issues    4  Week Follow Up     HPI: Patient is a 87 y.o. male seen today for medical management of chronic conditions.   Daughter present for encounter.   Recently seen by cardiology 02/15. Lasix was reduced to 20mg . Since his visit with cardiology, he is weighting himself everyday. Reports only a 1-2 lbs weight fluctuation. Continues to follow a low sodium diet. Denies chest pain, shortness of breath or ankle edema. Plans to follow up with cardiology to discuss Watchman procedure.   Levothyroxine continues to be given on empty stomach. He denies fatigue at this time.   Appetite- eating 3 meals a day.Claims the meat is overcooked. Drinking water well.   Falls- slipped out of the bed and landed on bottom 2 weeks ago. No injury. Still doing PT/OT daily. Ambulating with rollator. Independent with ADLs. Goal to get strong enough to move to independent living.   Recently seen by dermatology PA. Halobetasol refilled and recommended cerave with salicylic acid advised. Plans to see Dr. Jari Pigg in next few weeks.   Constipation resolved with twice daily senakot. Stools described as loose. Going 6-7 times a week. Denies  Diarrhea or abdominal pain.   MMSE results reviewed. Score  27/30. At times he has trouble recalling word or thought. He is very pleasant and answers all questions appropriately today without assistance.    Review of Systems:  Review of Systems  Constitutional: Negative for fever, malaise/fatigue and weight loss.  HENT: Negative for congestion, hearing loss and sore throat.   Eyes: Negative for blurred vision and double vision.  Respiratory: Negative for cough, shortness of breath and wheezing.   Cardiovascular: Positive for leg swelling. Negative for chest pain and palpitations.  Gastrointestinal: Negative for abdominal pain, constipation, diarrhea, heartburn, nausea and vomiting.  Genitourinary: Negative for dysuria and frequency.  Musculoskeletal: Positive for falls, joint pain and myalgias.  Skin:       Dry skin  Neurological: Positive for weakness. Negative for dizziness and headaches.  Endo/Heme/Allergies: Bruises/bleeds easily.  Psychiatric/Behavioral: Positive for memory loss. Negative for depression. The patient is not nervous/anxious and does not have insomnia.     Past Medical History:  Diagnosis Date  . Acute anemia 09/26/2020  . Anemia due to acute blood loss 05/25/2019  . Chronic maxillary sinusitis 08/06/2017  . Coronary artery disease due to lipid rich plaque 07/27/2019  . Essential hypertension 02/14/2003  . Fainting 05/30/2019  . Fall 05/25/1999  . High cholesterol 03/14/2001  . History of basal cell carcinoma 11/02/2019  . History of pneumonia   . History of snoring   . History of upper respiratory infection   . Irregular heartbeat 08/06/2003  . Ischemic cardiomyopathy 05/30/2019  . Mild cognitive impairment 05/15/2019  . NSTEMI (non-ST elevated myocardial infarction) ([place]) 06/03/2019  . Poor  renal function 03/19/2005  . Primary osteoarthritis of shoulder 10/24/2015  . Psoriasis 12/29/2004  . Rash and nonspecific skin eruption 04/30/2020  . Schatzki's ring 08/07/2020  . Sepsis with acute hypoxic respiratory  failure ([place]) 09/26/2020  . Sleep apnea 08/13/2003   Past Surgical History:  Procedure Laterality Date  . APPENDECTOMY    . CATARACT EXTRACTION    . COLONOSCOPY    . HERNIA REPAIR    . RIGHT HEART CATHETERIZATION WITH ADENOSINE STUDY    . UPPER GI ENDOSCOPY     Social History:   reports that he quit smoking about 62 years ago. His smoking use included cigarettes. He smoked 0.25 packs per day. He has never used smokeless tobacco. He reports current alcohol use of about 3.0 standard drinks of alcohol per week. He reports that he does not use drugs.  Family History  Problem Relation Age of Onset  . Lung disease Mother   . Heart attack Father   . Brain cancer Brother   . Breast cancer Daughter   . Celiac disease Daughter   . Diabetes Mellitus I Son     Medications: Patient's Medications  New Prescriptions   No medications on file  Previous Medications   ACETAMINOPHEN (TYLENOL) 500 MG TABLET    Take 500 mg by mouth every 6 (six) hours as needed.   ALBUTEROL (VENTOLIN HFA) 108 (90 BASE) MCG/ACT INHALER    Inhale 2 puffs into the lungs every 6 (six) hours as needed for wheezing or shortness of breath.   AMIODARONE (PACERONE) 200 MG TABLET    Take 200 mg by mouth daily.   APIXABAN (ELIQUIS) 5 MG TABS TABLET    Take 5 mg by mouth 2 (two) times daily.   CALCIUM CITRATE PO    Take 1 tablet by mouth 2 (two) times daily.   CLOPIDOGREL (PLAVIX) 75 MG TABLET    Take 75 mg by mouth daily.   CYPROHEPTADINE (PERIACTIN) 4 MG TABLET    Take 4 mg by mouth in the morning, at noon, and at bedtime.   FERROUS SULFATE 325 (65 FE) MG TABLET    Take 325 mg by mouth daily.   FEXOFENADINE HCL (ALLEGRA ALLERGY PO)    Take 1 tablet by mouth every morning.   FLUTICASONE (FLONASE) 50 MCG/ACT NASAL SPRAY    Place 2 sprays into both nostrils 2 (two) times daily.   FLUTICASONE FUROATE-VILANTEROL (BREO ELLIPTA) 100-25 MCG/INH AEPB    Inhale 1 puff into the lungs daily.   FUROSEMIDE (LASIX) 40 MG TABLET    Take 20  mg by mouth in the morning.   HALOBETASOL (ULTRAVATE) 0.05 % CREAM    Apply 1 application topically 2 (two) times daily.   LEVOTHYROXINE (SYNTHROID) 50 MCG TABLET    Take 1 tablet (50 mcg total) by mouth daily.   LOSARTAN (COZAAR) 50 MG TABLET    Take 50 mg by mouth in the morning.   METOPROLOL SUCCINATE (TOPROL-XL) 50 MG 24 HR TABLET    Take 50 mg by mouth daily. Take with or immediately following a meal.   MONTELUKAST (SINGULAIR) 10 MG TABLET    Take 10 mg by mouth daily.   MULTIPLE VITAMIN (MULTIVITAMIN PO)    Take 1 tablet by mouth daily.   PANTOPRAZOLE (PROTONIX) 40 MG TABLET    Take 40 mg by mouth daily.   ROSUVASTATIN (CRESTOR) 20 MG TABLET    Take 20 mg by mouth daily.   SAW PALMETTO, SERENOA REPENS, (SAW PALMETTO FRUIT  PO)    Take 450 mg by mouth 2 (two) times daily.  Modified Medications   No medications on file  Discontinued Medications   BENZONATATE (TESSALON) 100 MG CAPSULE    Take by mouth 3 (three) times daily as needed for cough.   IPRATROPIUM (ATROVENT HFA) 17 MCG/ACT INHALER    Inhale 2 puffs into the lungs in the morning, at noon, in the evening, and at bedtime.   TRIAMCINOLONE (KENALOG) 0.1 %    Apply 1 application topically as needed.    Physical Exam:  Vitals:   12/10/20 0844  BP: 130/66  Pulse: 64  Resp: 20  Temp: 97.9 F (36.6 C)  TempSrc: Temporal  SpO2: 98%  Weight: 172 lb 3.2 oz (78.1 kg)  Height: 5\' 8"  (1.727 m)   Body mass index is 26.18 kg/m. Wt Readings from Last 3 Encounters:  12/10/20 172 lb 3.2 oz (78.1 kg)  11/26/20 174 lb 12.8 oz (79.3 kg)  11/12/20 172 lb (78 kg)    Physical Exam Vitals reviewed.  Constitutional:      General: He is not in acute distress.    Appearance: He is normal weight.  HENT:     Head: Normocephalic.     Right Ear: There is no impacted cerumen.     Left Ear: There is no impacted cerumen.     Ears:     Comments: Dry skin noted on outer ears Eyes:     General:        Right eye: No discharge.        Left  eye: No discharge.  Cardiovascular:     Rate and Rhythm: Normal rate. Rhythm irregular.     Pulses:          Dorsalis pedis pulses are 1+ on the right side and 1+ on the left side.     Heart sounds: Normal heart sounds. No murmur heard.   Pulmonary:     Effort: Pulmonary effort is normal. No respiratory distress.     Breath sounds: Normal breath sounds. No wheezing.  Abdominal:     General: Bowel sounds are normal. There is no distension.     Palpations: Abdomen is soft.     Tenderness: There is no abdominal tenderness.  Musculoskeletal:     Cervical back: Normal range of motion.     Right lower leg: Edema present.     Left lower leg: Edema present.     Comments: +1 pitting  Feet:     Right foot:     Protective Sensation: 10 sites tested. 9 sites sensed.     Skin integrity: Skin integrity normal.     Toenail Condition: Right toenails are long. Fungal disease present.    Left foot:     Protective Sensation: 10 sites tested. 9 sites sensed.     Skin integrity: Skin integrity normal.     Toenail Condition: Left toenails are long. Fungal disease present. Lymphadenopathy:     Cervical: No cervical adenopathy.  Skin:    General: Skin is warm and dry.     Capillary Refill: Capillary refill takes less than 2 seconds.  Neurological:     General: No focal deficit present.     Mental Status: He is alert and oriented to person, place, and time.     Motor: Weakness present.     Gait: Gait abnormal.     Comments: rollator  Psychiatric:        Mood and Affect: Mood normal.  Behavior: Behavior normal.    Labs reviewed: Basic Metabolic Panel: Recent Labs    11/12/20 1629 11/26/20 1550  NA 144 141  K 4.6 4.8  CL 103 100  CO2 32 24  GLUCOSE 91 132*  BUN 16 18  CREATININE 1.60* 1.45*  CALCIUM 10.0 9.6  TSH 14.35*  --    Liver Function Tests: Recent Labs    11/12/20 1629 11/26/20 1550  AST 16 22  ALT 13 17  ALKPHOS  --  44  BILITOT 0.4 0.2  PROT 6.8 6.7   ALBUMIN  --  4.4   No results for input(s): LIPASE, AMYLASE in the last 8760 hours. No results for input(s): AMMONIA in the last 8760 hours. CBC: Recent Labs    11/12/20 1629 11/26/20 1550  WBC 10.7 8.7  NEUTROABS 6,762  --   HGB 10.9* 11.0*  HCT 33.1* 34.4*  MCV 98.5 101*  PLT 325 348   Lipid Panel: No results for input(s): CHOL, HDL, LDLCALC, TRIG, CHOLHDL, LDLDIRECT in the last 8760 hours. TSH: Recent Labs    11/12/20 1629  TSH 14.35*   A1C: No results found for: HGBA1C   Assessment/Plan 1. Low hemoglobin - continues to improve after hospitalization, remains asymptomatic - CBC with Differential/Platelets  2. Acquired hypothyroidism - asymptomatic - TSH- today - cont levothyroxine 50 mcg po daily  3. Chronic combined systolic and diastolic heart failure (HCC) - no weight fluctuations or sob, +1 pitting ankle edema - cont lasix 20 mg po daily - Brain natriuretic peptide- today - Basic metabolic panel- today  4. Screening for lipid disorders - cont diet low in fat and fried foods - cont crestor - Lipid Panel  5. Chronic kidney disease, stage 3a ([place]) - continue to avoid nephrotoxic drugs and dose adjust medications to be renally excreted - creat elevated 1.6 two weeks ago - lasix reduced to prevent over diuresis - cont lasix 20 mg po daily  6. Primary hypertension - controlled with metoprolol - cont to follow low sodium diet < 2000 mg/day  7. Coronary artery disease due to lipid rich plaque - cont crestor - lipid panel- today  8. Mild cognitive impairment - MMSE 27/30 - continues to perform all ADL's independently  9. Basal cell carcinoma (BCC) of scalp - followed by dermatology, f/u in a few weeks - will request last visit note  10. Iron deficiency anemia due to chronic blood loss - cont ferrous sulfate 325 mg po daily  11. Unsteady gait - fall reported 2 weeks ago, no injury  - cont PT/OT - cont falls safety precautions  12. Atrial  fibrillation, unspecified type (HCC) - rate controlled with amiodarone and metoprolol - cont eliquis for clot prevention - remains high risk anticoagulation candidate - plans to f/u with Dr. Quentin Ore to discuss Watchman procedure  13. Slow transit constipation - resolved with senakot po bid   I provided 35 minutes of face-to-face time during this encounter.     Next appt: Visit date not found [place], [facility] [phone]

## 2020-12-10 NOTE — Patient Instructions (Signed)
Hypothyroidism  Hypothyroidism is when the thyroid gland does not make enough of certain hormones (it is underactive). The thyroid gland is a small gland located in the lower front part of the neck, just in front of the windpipe (trachea). This gland makes hormones that help control how the body uses food for energy (metabolism) as well as how the heart and brain function. These hormones also play a role in keeping your bones strong. When the thyroid is underactive, it produces too little of the hormones thyroxine (T4) and triiodothyronine (T3). What are the causes? This condition may be caused by:  Hashimoto's disease. This is a disease in which the body's disease-fighting system (immune system) attacks the thyroid gland. This is the most common cause.  Viral infections.  Pregnancy.  Certain medicines.  Birth defects.  Past radiation treatments to the head or neck for cancer.  Past treatment with radioactive iodine.  Past exposure to radiation in the environment.  Past surgical removal of part or all of the thyroid.  Problems with a gland in the center of the brain (pituitary gland).  Lack of enough iodine in the diet. What increases the risk? You are more likely to develop this condition if:  You are male.  You have a family history of thyroid conditions.  You use a medicine called lithium.  You take medicines that affect the immune system (immunosuppressants). What are the signs or symptoms? Symptoms of this condition include:  Feeling as though you have no energy (lethargy).  Not being able to tolerate cold.  Weight gain that is not explained by a change in diet or exercise habits.  Lack of appetite.  Dry skin.  Coarse hair.  Menstrual irregularity.  Slowing of thought processes.  Constipation.  Sadness or depression. How is this diagnosed? This condition may be diagnosed based on:  Your symptoms, your medical history, and a physical exam.  Blood  tests. You may also have imaging tests, such as an ultrasound or MRI. How is this treated? This condition is treated with medicine that replaces the thyroid hormones that your body does not make. After you begin treatment, it may take several weeks for symptoms to go away. Follow these instructions at home:  Take over-the-counter and prescription medicines only as told by your health care provider.  If you start taking any new medicines, tell your health care provider.  Keep all follow-up visits as told by your health care provider. This is important. ? As your condition improves, your dosage of thyroid hormone medicine may change. ? You will need to have blood tests regularly so that your health care provider can monitor your condition. Contact a health care provider if:  Your symptoms do not get better with treatment.  You are taking thyroid hormone replacement medicine and you: ? Sweat a lot. ? Have tremors. ? Feel anxious. ? Lose weight rapidly. ? Cannot tolerate heat. ? Have emotional swings. ? Have diarrhea. ? Feel weak. Get help right away if you have:  Chest pain.  An irregular heartbeat.  A rapid heartbeat.  Difficulty breathing. Summary  Hypothyroidism is when the thyroid gland does not make enough of certain hormones (it is underactive).  When the thyroid is underactive, it produces too little of the hormones thyroxine (T4) and triiodothyronine (T3).  The most common cause is Hashimoto's disease, a disease in which the body's disease-fighting system (immune system) attacks the thyroid gland. The condition can also be caused by viral infections, medicine, pregnancy, or   past radiation treatment to the head or neck.  Symptoms may include weight gain, dry skin, constipation, feeling as though you do not have energy, and not being able to tolerate cold.  This condition is treated with medicine to replace the thyroid hormones that your body does not make. This  information is not intended to replace advice given to you by your health care provider. Make sure you discuss any questions you have with your health care provider. Document Revised: 06/28/2020 Document Reviewed: 06/13/2020 Elsevier Patient Education  2021 [employer]  

## 2020-12-11 ENCOUNTER — Encounter: Payer: Self-pay | Admitting: Orthopedic Surgery

## 2020-12-11 ENCOUNTER — Other Ambulatory Visit: Payer: Self-pay | Admitting: Orthopedic Surgery

## 2020-12-11 DIAGNOSIS — E039 Hypothyroidism, unspecified: Secondary | ICD-10-CM

## 2020-12-11 LAB — CBC WITH DIFFERENTIAL/PLATELET
Absolute Monocytes: 816 cells/uL (ref 200–950)
Basophils Absolute: 80 cells/uL (ref 0–200)
Basophils Relative: 1 %
Eosinophils Absolute: 792 cells/uL — ABNORMAL HIGH (ref 15–500)
Eosinophils Relative: 9.9 %
HCT: 33.4 % — ABNORMAL LOW (ref 38.5–50.0)
Hemoglobin: 11.1 g/dL — ABNORMAL LOW (ref 13.2–17.1)
Lymphs Abs: 1320 cells/uL (ref 850–3900)
MCH: 33.8 pg — ABNORMAL HIGH (ref 27.0–33.0)
MCHC: 33.2 g/dL (ref 32.0–36.0)
MCV: 101.8 fL — ABNORMAL HIGH (ref 80.0–100.0)
MPV: 10.7 fL (ref 7.5–12.5)
Monocytes Relative: 10.2 %
Neutro Abs: 4992 cells/uL (ref 1500–7800)
Neutrophils Relative %: 62.4 %
Platelets: 332 10*3/uL (ref 140–400)
RBC: 3.28 10*6/uL — ABNORMAL LOW (ref 4.20–5.80)
RDW: 15.4 % — ABNORMAL HIGH (ref 11.0–15.0)
Total Lymphocyte: 16.5 %
WBC: 8 10*3/uL (ref 3.8–10.8)

## 2020-12-11 LAB — BASIC METABOLIC PANEL
BUN/Creatinine Ratio: 11 (calc) (ref 6–22)
BUN: 15 mg/dL (ref 7–25)
CO2: 29 mmol/L (ref 20–32)
Calcium: 9.6 mg/dL (ref 8.6–10.3)
Chloride: 107 mmol/L (ref 98–110)
Creat: 1.37 mg/dL — ABNORMAL HIGH (ref 0.70–1.11)
Glucose, Bld: 91 mg/dL (ref 65–99)
Potassium: 4.7 mmol/L (ref 3.5–5.3)
Sodium: 145 mmol/L (ref 135–146)

## 2020-12-11 LAB — LIPID PANEL
Cholesterol: 147 mg/dL (ref <200)
HDL: 51 mg/dL (ref >=40)
LDL Cholesterol (Calc): 71 mg/dL (calc)
Non-HDL Cholesterol (Calc): 96 mg/dL (calc) (ref <130)
Total CHOL/HDL Ratio: 2.9 (calc) (ref <5.0)
Triglycerides: 176 mg/dL — ABNORMAL HIGH (ref <150)

## 2020-12-11 LAB — TSH: TSH: 9.96 mIU/L — ABNORMAL HIGH (ref 0.40–4.50)

## 2020-12-11 LAB — BRAIN NATRIURETIC PEPTIDE: Brain Natriuretic Peptide: 361 pg/mL — ABNORMAL HIGH (ref <100)

## 2020-12-11 MED ORDER — LEVOTHYROXINE SODIUM 88 MCG PO TABS: 88.0000 ug | ORAL_TABLET | Freq: Every day | ORAL | 3 refills | Status: DC

## 2020-12-11 NOTE — Addendum Note (Signed)
Addended by: Logan Bores on: 12/11/2020 02:50 PM   Modules accepted: Orders

## 2020-12-17 ENCOUNTER — Encounter: Payer: Self-pay | Admitting: Cardiology

## 2020-12-17 ENCOUNTER — Other Ambulatory Visit: Payer: Self-pay

## 2020-12-17 ENCOUNTER — Ambulatory Visit (INDEPENDENT_AMBULATORY_CARE_PROVIDER_SITE_OTHER): Payer: Medicare Other | Admitting: Cardiology

## 2020-12-17 VITALS — BP 128/54 | HR 62 | Ht 68.0 in | Wt 167.0 lb

## 2020-12-17 DIAGNOSIS — I4891 Unspecified atrial fibrillation: Secondary | ICD-10-CM

## 2020-12-17 DIAGNOSIS — I10 Essential (primary) hypertension: Secondary | ICD-10-CM

## 2020-12-17 DIAGNOSIS — I5042 Chronic combined systolic (congestive) and diastolic (congestive) heart failure: Secondary | ICD-10-CM | POA: Diagnosis not present

## 2020-12-17 DIAGNOSIS — K31819 Angiodysplasia of stomach and duodenum without bleeding: Secondary | ICD-10-CM

## 2020-12-17 NOTE — Progress Notes (Signed)
Electrophysiology Office Note:    Date:  12/17/2020   ID:  Ronnie Joseph, DOB Feb 09, 1932, MRN [id number]  PCP:  [name], NP  [place] Cardiologist:  No primary care provider on file.  [facility] Electrophysiologist:  Vickie Epley, MD   Referring MD: Donato Heinz*   Chief Complaint: Atrial fibrillation  History of Present Illness:    Ronnie Joseph is a 87 y.o. male who presents for an evaluation of atrial fibrillation at the request of Dr. Gardiner Rhyme. Their medical history includes coronary artery disease post PCI to the LAD and circumflex in August 2020, ischemic cardiomyopathy with an ejection fraction of 35%, atrial fibrillation on Eliquis.  He also has a history of hemodynamically significant GI bleeds from AVMs.  He underwent a recent EGD and colonoscopy on December 2021 which showed duodenal AVMs post APC.  He was started on amiodarone during that admission.  He recently moved to [place] from [facility].  He continues to have dark stools.  He is also had a history of falls.  He had a recent NSTEMI in [place] in December 2021.  Upon arrival to the hospital he was anemic with a troponin at 8300.  Medical management was pursued.  He had an EGD and colonoscopy on September 30, 2020 which showed duodenal AVMs.  These were treated with APC.  Since this procedure, he has not noticed any more bleeding from his GI tract.  He does have dark stools but this is related to his iron supplement.  His hemoglobin has trended upwards since discharge.  He is in clinic today with his daughter.  He is a retired Software engineer.  Past Medical History:  Diagnosis Date  . Acute anemia 09/26/2020  . Anemia due to acute blood loss 05/25/2019  . Chronic maxillary sinusitis 08/06/2017  . Coronary artery disease due to lipid rich plaque 07/27/2019  . Essential hypertension 02/14/2003  . Fainting 05/30/2019  . Fall 05/25/1999  . High cholesterol 03/14/2001  . History of  basal cell carcinoma 11/02/2019  . History of pneumonia   . History of snoring   . History of upper respiratory infection   . Irregular heartbeat 08/06/2003  . Ischemic cardiomyopathy 05/30/2019  . Mild cognitive impairment 05/15/2019  . NSTEMI (non-ST elevated myocardial infarction) ([place]) 06/03/2019  . Poor renal function 03/19/2005  . Primary osteoarthritis of shoulder 10/24/2015  . Psoriasis 12/29/2004  . Rash and nonspecific skin eruption 04/30/2020  . Schatzki's ring 08/07/2020  . Sepsis with acute hypoxic respiratory failure ([place]) 09/26/2020  . Sleep apnea 08/13/2003    Past Surgical History:  Procedure Laterality Date  . APPENDECTOMY    . CATARACT EXTRACTION    . COLONOSCOPY    . HERNIA REPAIR    . RIGHT HEART CATHETERIZATION WITH ADENOSINE STUDY    . UPPER GI ENDOSCOPY      Current Medications: Current Meds  Medication Sig  . acetaminophen (TYLENOL) 500 MG tablet Take 500 mg by mouth every 6 (six) hours as needed.  Marland Kitchen amiodarone (PACERONE) 200 MG tablet Take 200 mg by mouth daily.  Marland Kitchen apixaban (ELIQUIS) 5 MG TABS tablet Take 5 mg by mouth 2 (two) times daily.  Marland Kitchen CALCIUM CITRATE PO Take 1 tablet by mouth 2 (two) times daily.  . clopidogrel (PLAVIX) 75 MG tablet Take 75 mg by mouth daily.  . cyproheptadine (PERIACTIN) 4 MG tablet Take 4 mg by mouth in the morning, at noon, and at bedtime.  . ferrous sulfate 325 (65  FE) MG tablet Take 325 mg by mouth daily.  Marland Kitchen Fexofenadine HCl (ALLEGRA ALLERGY PO) Take 1 tablet by mouth every morning.  . fluticasone (FLONASE) 50 MCG/ACT nasal spray Place 2 sprays into both nostrils 2 (two) times daily.  . fluticasone furoate-vilanterol (BREO ELLIPTA) 100-25 MCG/INH AEPB Inhale 1 puff into the lungs daily.  . furosemide (LASIX) 20 MG tablet Take 20 mg by mouth daily.  . halobetasol (ULTRAVATE) 0.05 % cream Apply 1 application topically 2 (two) times daily.  Marland Kitchen levocetirizine (XYZAL) 5 MG tablet Take 5 mg by mouth daily.  Marland Kitchen levothyroxine  (SYNTHROID) 88 MCG tablet Take 1 tablet (88 mcg total) by mouth daily.  Marland Kitchen losartan (COZAAR) 50 MG tablet Take 50 mg by mouth in the morning.  . metoprolol succinate (TOPROL-XL) 50 MG 24 hr tablet Take 50 mg by mouth daily. Take with or immediately following a meal.  . montelukast (SINGULAIR) 10 MG tablet Take 10 mg by mouth daily.  . Multiple Vitamin (MULTIVITAMIN PO) Take 1 tablet by mouth daily.  . pantoprazole (PROTONIX) 40 MG tablet Take 40 mg by mouth daily.  . rosuvastatin (CRESTOR) 20 MG tablet Take 20 mg by mouth daily.  . Saw Palmetto, Serenoa repens, (SAW PALMETTO FRUIT PO) Take 450 mg by mouth 2 (two) times daily.     Allergies:   Lisinopril, Telmisartan, Atorvastatin, and Sulfa antibiotics   Social History   Socioeconomic History  . Marital status: Widowed    Spouse name: Not on file  . Number of children: Not on file  . Years of education: Not on file  . Highest education level: Not on file  Occupational History  . Not on file  Tobacco Use  . Smoking status: Former Smoker    Packs/day: 0.25    Types: Cigarettes    Quit date: 12/17/1958    Years since quitting: 62.0  . Smokeless tobacco: Never Used  Vaping Use  . Vaping Use: Never used  Substance and Sexual Activity  . Alcohol use: Yes    Alcohol/week: 3.0 standard drinks    Types: 3 Standard drinks or equivalent per week  . Drug use: Never  . Sexual activity: Not Currently  Other Topics Concern  . Not on file  Social History Narrative   Tobacco use, amount per day now: 0   Past tobacco use, amount per day: Less than 1 pack   How many years did you use tobacco: 5 years, stopped in 1960   Alcohol use (drinks per week): 4   Diet: N/A   Do you drink/eat things with caffeine: Yes.   Marital status: Widowed                                  What year were you married? 1960   Do you live in a house, apartment, assisted living, condo, trailer, etc.? Assisted Living.   Is it one or more stories? 1    How many persons  live in your home? 1   Do you have pets in your home?( please list) No.   Highest Level of education completed? College   Current or past profession: Pharmacist   Do you exercise?  No.                                Type and how often?   Do you have a  living will? Yes.   Do you have a DNR form?  Yes.                                 If not, do you want to discuss one?   Do you have signed POA/HPOA forms? Yes.                       If so, please bring to you appointment      Do you have any difficulty bathing or dressing yourself? Yes.   Do you have any difficulty preparing food or eating? Yes.   Do you have any difficulty managing your medications? Yes.   Do you have any difficulty managing your finances? No.   Do you have any difficulty affording your medications? No.   Social Determinants of Health   Financial Resource Strain: Not on file  Food Insecurity: Not on file  Transportation Needs: Not on file  Physical Activity: Not on file  Stress: Not on file  Social Connections: Not on file     Family History: The patient's family history includes Brain cancer in his brother; Breast cancer in his daughter; Celiac disease in his daughter; Diabetes Mellitus I in his son; Heart attack in his father; Lung disease in his mother.  ROS:   Please see the history of present illness.    All other systems reviewed and are negative.  EKGs/Labs/Other Studies Reviewed:    The following studies were reviewed today:  November 26, 2020 EKG Sinus bradycardia with right bundle branch block and left anterior fascicular block.  There is also a first-degree AV delay.   EKG:  The ekg ordered today demonstrates sinus rhythm, right bundle branch block, left anterior fascicular block, first-degree AV delay  Recent Labs: 11/26/2020: ALT 17 12/10/2020: Brain Natriuretic Peptide 361; BUN 15; Creat 1.37; Hemoglobin 11.1; Platelets 332; Potassium 4.7; Sodium 145; TSH 9.96  Recent Lipid Panel    Component  Value Date/Time   CHOL 147 12/10/2020 0000   TRIG 176 (H) 12/10/2020 0000   HDL 51 12/10/2020 0000   CHOLHDL 2.9 12/10/2020 0000   LDLCALC 71 12/10/2020 0000    Physical Exam:    VS:  BP (!) 128/54   Pulse 62   Ht 5\' 8"  (1.727 m)   Wt 167 lb (75.8 kg)   SpO2 98%   BMI 25.39 kg/m     Wt Readings from Last 3 Encounters:  12/17/20 167 lb (75.8 kg)  12/10/20 172 lb 3.2 oz (78.1 kg)  11/26/20 174 lb 12.8 oz (79.3 kg)     GEN:  Well nourished, well developed in no acute distress.  Appears younger than stated age. HEENT: Normal NECK: No JVD; No carotid bruits LYMPHATICS: No lymphadenopathy CARDIAC: RRR, no murmurs, rubs, gallops RESPIRATORY:  Clear to auscultation without rales, wheezing or rhonchi  ABDOMEN: Soft, non-tender, non-distended MUSCULOSKELETAL:  No edema; No deformity  SKIN: Warm and dry NEUROLOGIC:  Alert and oriented x 3 PSYCHIATRIC:  Normal affect   ASSESSMENT:    1. Atrial fibrillation, unspecified type ([place])   2. Chronic combined systolic and diastolic heart failure ([place])   3. Gastric AVM   4. Essential hypertension    PLAN:    In order of problems listed above:  1. Atrial fibrillation Currently rhythm controlled with amiodarone.  On Eliquis 5 mg twice daily for stroke prophylaxis.  His anticoagulation has been  complicated by bleeding gastric AVMs.  Thankfully, he is tolerating his Plavix and Eliquis without recurrent GI bleeding.  We discussed stroke prophylaxis and anticoagulation at length during today's visit.  We discussed the utility of a left atrial appendage occlusion device in the management of his atrial fibrillation.  I do think he would be a candidate for this procedure but I think it would be very reasonable to continue taking anticoagulation for now to see if he has a recurrent episode of GI bleeding.  I discussed the procedure in detail with the patient including the risks, expected recovery time and need for short-term anticoagulation.  The  patient is understandably hesitant to have any invasive procedure.  For now, we will plan to continue his Eliquis and Plavix.  If he develops recurrent GI bleeding, I would recommend we regroup in clinic to discuss proceeding with watchman implant.  Defer decision for DAPT to Dr. Gardiner Rhyme.  Given his current renal function, the patient qualifies for Eliquis 5 mg twice daily but his kidney function will have to be closely monitored as in the past it has been greater than 1.5 which would necessitate a lower dose of Eliquis.  This was discussed in detail with the patient who is understanding.  Recommend close follow-up with Dr. Gardiner Rhyme.  If the patient decides to proceed with watchman implant I have advised the patient to reach out to my clinic to have an appointment scheduled.   2.  Chronic combined systolic and diastolic heart failure NYHA class II symptoms today.  Warm and dry. Recommend rhythm control strategy. Losartan, metoprolol  3.  Hypertension Controlled during today's visit.  Continue current regimen.   Medication Adjustments/Labs and Tests Ordered: Current medicines are reviewed at length with the patient today.  Concerns regarding medicines are outlined above.  Orders Placed This Encounter  Procedures  . EKG 12-Lead   No orders of the defined types were placed in this encounter.    Signed, Lars Mage, MD, [facility]  12/17/2020 1:06 PM    Electrophysiology [facility]

## 2020-12-17 NOTE — Patient Instructions (Addendum)
Medication Instructions:  Your physician recommends that you continue on your current medications as directed. Please refer to the Current Medication list given to you today.  Labwork: None ordered.  Testing/Procedures: None ordered.  Follow-Up: Your physician wants you to follow-up in: as needed with Dr. Lambert.    Any Other Special Instructions Will Be Listed Below (If Applicable).  If you need a refill on your cardiac medications before your next appointment, please call your pharmacy.   

## 2021-01-06 ENCOUNTER — Encounter: Payer: Self-pay | Admitting: Orthopedic Surgery

## 2021-01-06 NOTE — Progress Notes (Deleted)
Cardiology Office Note:    Date:  01/10/2021   ID:  Ronnie Joseph, DOB November 01, 1931, MRN [id number]  PCP:  [name], NP  Cardiologist:  No primary care provider on file.  Electrophysiologist:  Vickie Epley, MD   Referring MD: [name], NP   Chief Complaint  Patient presents with  . Follow-up  . Edema    Ankles.    History of Present Illness:    Ronnie Joseph is a 87 y.o. male with a hx of CAD status post PCI to LAD and LCx in 05/2019, ischemic cardiomyopathy (EF 25% post PCI, improved to 35-40 % on most recent echo), atrial fibrillation on Eliquis who presents for an initial visit.  His previous cardiologist was [name] in [facility].  Echocardiogram 09/26/2020 in [place] showed LVEF 35 to 40%, mildly reduced RV systolic function.  Cardiac catheterization on 06/01/2019 showed tandem diffuse 90% stenosis of mid LAD status post DES, 95% distal LCx stenosis status post DES.  Echocardiogram on 05/26/2019 showed LVEF 25 to 30%.  He was admitted from 12/16 through 10/02/20 with NSTEMI in [place].  He presented to ED with chest pain.  Was also found to have fever to 100.8, lactic acid of 12, hemoglobin 7.5.  Troponin peaked at 8300.  He received 2 units PRBCs.  He was evaluated by cardiology, who recommended medical management.  He has a history of GI bleeds with AVMs.  Underwent EGD/colonoscopy on 09/30/20 which showed duodenal AVMs status post APC and colonoscopy showed ascending colon AVM status post APC.  During admission was tolerating Plavix and apixaban without any further bleeding.  He was started on amiodarone for his atrial fibrillation, with plans for amiodarone 200 mg twice daily x1 month then 200 mg daily.  He recently moved to [place] from [place] to be close to his daughter.  Reports he continues to have black stools but has been taking iron supplements.  He denies any chest pain since discharge from hospital.  Denies any shortness of breath,  lightheadedness, syncope, or palpitations.  Reports he has not been weighing himself daily but weight has been stable when he checks.  Does report he has had unsteadiness on his feet and has had some falls.  Since last clinic visit, Weight stable, le edema.no cp dyspnea   Wt Readings from Last 3 Encounters:  01/10/21 169 lb (76.7 kg)  12/17/20 167 lb (75.8 kg)  12/10/20 172 lb 3.2 oz (78.1 kg)    Past Medical History:  Diagnosis Date  . Acute anemia 09/26/2020  . Anemia due to acute blood loss 05/25/2019  . Chronic maxillary sinusitis 08/06/2017  . Coronary artery disease due to lipid rich plaque 07/27/2019  . Essential hypertension 02/14/2003  . Fainting 05/30/2019  . Fall 05/25/1999  . High cholesterol 03/14/2001  . History of basal cell carcinoma 11/02/2019  . History of pneumonia   . History of snoring   . History of upper respiratory infection   . Irregular heartbeat 08/06/2003  . Ischemic cardiomyopathy 05/30/2019  . Mild cognitive impairment 05/15/2019  . NSTEMI (non-ST elevated myocardial infarction) ([place]) 06/03/2019  . Poor renal function 03/19/2005  . Primary osteoarthritis of shoulder 10/24/2015  . Psoriasis 12/29/2004  . Rash and nonspecific skin eruption 04/30/2020  . Schatzki's ring 08/07/2020  . Sepsis with acute hypoxic respiratory failure ([place]) 09/26/2020  . Sleep apnea 08/13/2003    Past Surgical History:  Procedure Laterality Date  . APPENDECTOMY    . CATARACT EXTRACTION    .  COLONOSCOPY    . HERNIA REPAIR    . RIGHT HEART CATHETERIZATION WITH ADENOSINE STUDY    . UPPER GI ENDOSCOPY      Current Medications: Current Meds  Medication Sig  . acetaminophen (TYLENOL) 500 MG tablet Take 500 mg by mouth every 6 (six) hours as needed.  Marland Kitchen amiodarone (PACERONE) 200 MG tablet Take 200 mg by mouth daily.  Marland Kitchen apixaban (ELIQUIS) 5 MG TABS tablet Take 5 mg by mouth 2 (two) times daily.  Marland Kitchen CALCIUM CITRATE PO Take 1 tablet by mouth 2 (two) times daily.  .  clopidogrel (PLAVIX) 75 MG tablet Take 75 mg by mouth daily.  . cyproheptadine (PERIACTIN) 4 MG tablet Take 4 mg by mouth in the morning, at noon, and at bedtime.  [name] Sodium (DSS) 100 MG CAPS Take 1 tablet by mouth as needed.  . ferrous sulfate 325 (65 FE) MG tablet Take 325 mg by mouth daily.  Marland Kitchen Fexofenadine HCl (ALLEGRA ALLERGY PO) Take 1 tablet by mouth every morning.  . fluticasone (FLONASE) 50 MCG/ACT nasal spray Place 2 sprays into both nostrils 2 (two) times daily.  . fluticasone furoate-vilanterol (BREO ELLIPTA) 100-25 MCG/INH AEPB Inhale 1 puff into the lungs daily.  . furosemide (LASIX) 20 MG tablet Take 20 mg by mouth daily.  . halobetasol (ULTRAVATE) 0.05 % cream Apply 1 application topically 2 (two) times daily.  Marland Kitchen levocetirizine (XYZAL) 5 MG tablet Take 5 mg by mouth daily.  Marland Kitchen levothyroxine (SYNTHROID) 88 MCG tablet Take 1 tablet (88 mcg total) by mouth daily.  Marland Kitchen losartan (COZAAR) 50 MG tablet Take 50 mg by mouth in the morning.  . metoprolol succinate (TOPROL-XL) 50 MG 24 hr tablet Take 50 mg by mouth daily. Take with or immediately following a meal.  . montelukast (SINGULAIR) 10 MG tablet Take 10 mg by mouth daily.  . Multiple Vitamin (MULTIVITAMIN PO) Take 1 tablet by mouth daily.  . pantoprazole (PROTONIX) 40 MG tablet Take 40 mg by mouth daily.  . rosuvastatin (CRESTOR) 20 MG tablet Take 20 mg by mouth daily.  . Saw Palmetto, Serenoa repens, (SAW PALMETTO FRUIT PO) Take 450 mg by mouth 2 (two) times daily.     Allergies:   Lisinopril, Telmisartan, Atorvastatin, and Sulfa antibiotics   Social History   Socioeconomic History  . Marital status: Widowed    Spouse name: Not on file  . Number of children: Not on file  . Years of education: Not on file  . Highest education level: Not on file  Occupational History  . Not on file  Tobacco Use  . Smoking status: Former Smoker    Packs/day: 0.25    Types: Cigarettes    Quit date: 12/17/1958    Years since quitting:  62.1  . Smokeless tobacco: Never Used  Vaping Use  . Vaping Use: Never used  Substance and Sexual Activity  . Alcohol use: Yes    Alcohol/week: 3.0 standard drinks    Types: 3 Standard drinks or equivalent per week  . Drug use: Never  . Sexual activity: Not Currently  Other Topics Concern  . Not on file  Social History Narrative   Tobacco use, amount per day now: 0   Past tobacco use, amount per day: Less than 1 pack   How many years did you use tobacco: 5 years, stopped in 1960   Alcohol use (drinks per week): 4   Diet: N/A   Do you drink/eat things with caffeine: Yes.   Marital  status: Widowed                                  What year were you married? 1960   Do you live in a house, apartment, assisted living, condo, trailer, etc.? Assisted Living.   Is it one or more stories? 1    How many persons live in your home? 1   Do you have pets in your home?( please list) No.   Highest Level of education completed? College   Current or past profession: Pharmacist   Do you exercise?  No.                                Type and how often?   Do you have a living will? Yes.   Do you have a DNR form?  Yes.                                 If not, do you want to discuss one?   Do you have signed POA/HPOA forms? Yes.                       If so, please bring to you appointment      Do you have any difficulty bathing or dressing yourself? Yes.   Do you have any difficulty preparing food or eating? Yes.   Do you have any difficulty managing your medications? Yes.   Do you have any difficulty managing your finances? No.   Do you have any difficulty affording your medications? No.   Social Determinants of Health   Financial Resource Strain: Not on file  Food Insecurity: Not on file  Transportation Needs: Not on file  Physical Activity: Not on file  Stress: Not on file  Social Connections: Not on file     Family History: The patient's family history includes Brain cancer in his  brother; Breast cancer in his daughter; Celiac disease in his daughter; Diabetes Mellitus I in his son; Heart attack in his father; Lung disease in his mother.  ROS:   Please see the history of present illness.    All other systems reviewed and are negative.  EKGs/Labs/Other Studies Reviewed:    The following studies were reviewed today:   EKG:  EKG is  ordered today.  The ekg ordered today demonstrates sinus bradycardia, rate 58, first-degree AV block, inferior Q waves, RBBB  Recent Labs: 11/26/2020: ALT 17 12/10/2020: Brain Natriuretic Peptide 361; BUN 15; Creat 1.37; Hemoglobin 11.1; Platelets 332; Potassium 4.7; Sodium 145; TSH 9.96  Recent Lipid Panel    Component Value Date/Time   CHOL 147 12/10/2020 0000   TRIG 176 (H) 12/10/2020 0000   HDL 51 12/10/2020 0000   CHOLHDL 2.9 12/10/2020 0000   LDLCALC 71 12/10/2020 0000    Physical Exam:    VS:  BP (!) 138/50 (BP Location: Right Arm, Patient Position: Sitting, Cuff Size: Normal)   Pulse 64   Ht 5\' 8"  (1.727 m)   Wt 169 lb (76.7 kg)   BMI 25.70 kg/m     Wt Readings from Last 3 Encounters:  01/10/21 169 lb (76.7 kg)  12/17/20 167 lb (75.8 kg)  12/10/20 172 lb 3.2 oz (78.1 kg)     GEN:  in no acute  distress HEENT: Normal NECK: No JVD CARDIAC: RRR, 2/6 systolic murmur RESPIRATORY:  Clear to auscultation without rales, wheezing or rhonchi  ABDOMEN: Soft, non-tender, non-distended MUSCULOSKELETAL:  No edema; No deformity  SKIN: Warm and dry NEUROLOGIC:  Alert and oriented x 3 PSYCHIATRIC:  Normal affect   ASSESSMENT:    No diagnosis found. PLAN:    CAD: Cardiac catheterization on 06/01/2019 showed tandem diffuse 90% stenosis of mid LAD status post DES, 95% distal LCx stenosis status post DES.  Admitted in [place] with NSTEMI 09/2020.  In setting of GI bleed, medical management was recommended. -On Plavix plus Eliquis.  Given has been over a year since his last stent and considering recent GI bleeding  issues and falls, recommended switching to aspirin plus Eliquis.   -Continue rosuvastatin 20 mg daily  Chronic combined systolic and diastolic heart failure: EF 35-40% on most recent echo 09/2020.  On Toprol-XL 50 mg daily and losartan 50 mg daily. -Continue Toprol-XL 50 mg daily -Continue losartan 50 mg daily -On Lasix 20 mg daily.   -Check BMP, if stable then will add spironolactone 12.5 mg daily  Atrial fibrillation: Started on amiodarone during recent admission.  Currently in sinus rhythm.  CHA2DS2-VASc score 5 (CHF, hypertension, age x2, CAD) -Continue amiodarone 200 mg daily -Continue Toprol-XL 50 mg daily -Continue Eliquis 5 mg twice daily.  Most recent labs showed creatinine 1.37, does not meet criteria for reduced dose of Eliquis -While he has an elevated CHA2DS2-VASc score, he is a high risk anticoagulation candidate given his history of GI bleeding and falls.  Referred to Dr. Quentin Ore for [facility] evaluation, recommended monitoring for now, if have further bleeding consider Watchman  Anemia: Recent GI bleed, has iron deficiency anemia.  Hemoglobin appears stable at 11.0 on recent labs  Hypertension: On Toprol-XL and losartan.  Appears controlled  Hyperlipidemia: On rosuvastatin 20 mg daily.  LDL 71 on 12/10/2020  Hypothyroidism: TSH has been elevated, PCP adjusting levothyroxine dose.  Will check TSH  RTC in 2 months    Medication Adjustments/Labs and Tests Ordered: Current medicines are reviewed at length with the patient today.  Concerns regarding medicines are outlined above.  No orders of the defined types were placed in this encounter.  No orders of the defined types were placed in this encounter.   There are no Patient Instructions on file for this visit.   Signed, Donato Heinz, MD  01/10/2021 8:38 AM    [place]

## 2021-01-07 ENCOUNTER — Ambulatory Visit: Payer: Medicare Other | Admitting: Cardiology

## 2021-01-10 ENCOUNTER — Telehealth: Payer: Self-pay | Admitting: Cardiology

## 2021-01-10 ENCOUNTER — Other Ambulatory Visit: Payer: Self-pay

## 2021-01-10 ENCOUNTER — Ambulatory Visit (INDEPENDENT_AMBULATORY_CARE_PROVIDER_SITE_OTHER): Payer: Medicare Other | Admitting: Cardiology

## 2021-01-10 VITALS — BP 138/50 | HR 64 | Ht 68.0 in | Wt 169.0 lb

## 2021-01-10 DIAGNOSIS — I2510 Atherosclerotic heart disease of native coronary artery without angina pectoris: Secondary | ICD-10-CM | POA: Diagnosis not present

## 2021-01-10 DIAGNOSIS — I5042 Chronic combined systolic (congestive) and diastolic (congestive) heart failure: Secondary | ICD-10-CM | POA: Diagnosis not present

## 2021-01-10 DIAGNOSIS — I10 Essential (primary) hypertension: Secondary | ICD-10-CM

## 2021-01-10 DIAGNOSIS — E039 Hypothyroidism, unspecified: Secondary | ICD-10-CM

## 2021-01-10 DIAGNOSIS — I4891 Unspecified atrial fibrillation: Secondary | ICD-10-CM

## 2021-01-10 DIAGNOSIS — E875 Hyperkalemia: Secondary | ICD-10-CM

## 2021-01-10 LAB — BASIC METABOLIC PANEL
BUN/Creatinine Ratio: 14 (ref 10–24)
BUN: 22 mg/dL (ref 8–27)
CO2: 23 mmol/L (ref 20–29)
Calcium: 10 mg/dL (ref 8.6–10.2)
Chloride: 102 mmol/L (ref 96–106)
Creatinine, Ser: 1.62 mg/dL — ABNORMAL HIGH (ref 0.76–1.27)
Glucose: 124 mg/dL — ABNORMAL HIGH (ref 65–99)
Potassium: 5.4 mmol/L — ABNORMAL HIGH (ref 3.5–5.2)
Sodium: 142 mmol/L (ref 134–144)
eGFR: 41 mL/min/{1.73_m2} — ABNORMAL LOW (ref >59)

## 2021-01-10 LAB — TSH: TSH: 4 u[IU]/mL (ref 0.450–4.500)

## 2021-01-10 LAB — MAGNESIUM: Magnesium: 2.3 mg/dL (ref 1.6–2.3)

## 2021-01-10 MED ORDER — ASPIRIN EC 81 MG PO TBEC: 81.0000 mg | DELAYED_RELEASE_TABLET | Freq: Every day | ORAL | 11 refills | Status: DC

## 2021-01-10 NOTE — Progress Notes (Addendum)
Cardiology Office Note:    Date:  01/10/2021   ID:  [name], DOB [birth year], MRN [id number]  PCP:  [name], NP  Cardiologist:  No primary care provider on file.  Electrophysiologist:  Vickie Epley, MD   Referring MD: [name], NP   Chief Complaint  Patient presents with  . Follow-up  . Edema    Ankles.    History of Present Illness:    Ronnie Joseph is a 87 y.o. male with a hx of CAD status post PCI to LAD and LCx in 05/2019, ischemic cardiomyopathy (EF 25% post PCI, improved to 35-40 % on most recent echo), atrial fibrillation on Eliquis who presents for an initial visit.  His previous cardiologist was [name] in [facility].  Echocardiogram 09/26/2020 in [place] showed LVEF 35 to 40%, mildly reduced RV systolic function.  Cardiac catheterization on 06/01/2019 showed tandem diffuse 90% stenosis of mid LAD status post DES, 95% distal LCx stenosis status post DES.  Echocardiogram on 05/26/2019 showed LVEF 25 to 30%.  He was admitted from 12/16 through 10/02/20 with NSTEMI in [place].  He presented to ED with chest pain.  Was also found to have fever to 100.8, lactic acid of 12, hemoglobin 7.5.  Troponin peaked at 8300.  He received 2 units PRBCs.  He was evaluated by cardiology, who recommended medical management.  He has a history of GI bleeds with AVMs.  Underwent EGD/colonoscopy on 09/30/20 which showed duodenal AVMs status post APC and colonoscopy showed ascending colon AVM status post APC.  During admission was tolerating Plavix and apixaban without any further bleeding.  He was started on amiodarone for his atrial fibrillation, with plans for amiodarone 200 mg twice daily x1 month then 200 mg daily.  He recently moved to [place] from [place] to be close to his daughter.  Reports he continues to have black stools but has been taking iron supplements.  He denies any chest pain since discharge from hospital.  Denies any shortness of breath,  lightheadedness, syncope, or palpitations.  Reports he has not been weighing himself daily but weight has been stable when he checks.  Does report he has had unsteadiness on his feet and has had some falls.   He is accompanied by daughter at this visit. Since last clinic visit, he is still having mild LE edema after increasing his fluid medications. He has been taking his weight at his assistant living facility and it has been stable. He has no chest pain or tightness. He has no palpitations, lightheadedness, orthopnea, or PND. He is still have black stools from the extra iron that he is taking but nothing that he is concerned about. He notes falling on the floor in his bathroom one night but denies hitting his head or LOC. He was not using his walker during this incident which he contributes to his fall.    Wt Readings from Last 3 Encounters:  01/10/21 169 lb (76.7 kg)  12/17/20 167 lb (75.8 kg)  12/10/20 172 lb 3.2 oz (78.1 kg)    Past Medical History:  Diagnosis Date  . Acute anemia 09/26/2020  . Anemia due to acute blood loss 05/25/2019  . Chronic maxillary sinusitis 08/06/2017  . Coronary artery disease due to lipid rich plaque 07/27/2019  . Essential hypertension 02/14/2003  . Fainting 05/30/2019  . Fall 05/25/1999  . High cholesterol 03/14/2001  . History of basal cell carcinoma 11/02/2019  . History of pneumonia   . History  of snoring   . History of upper respiratory infection   . Irregular heartbeat 08/06/2003  . Ischemic cardiomyopathy 05/30/2019  . Mild cognitive impairment 05/15/2019  . NSTEMI (non-ST elevated myocardial infarction) ([place]) 06/03/2019  . Poor renal function 03/19/2005  . Primary osteoarthritis of shoulder 10/24/2015  . Psoriasis 12/29/2004  . Rash and nonspecific skin eruption 04/30/2020  . Schatzki's ring 08/07/2020  . Sepsis with acute hypoxic respiratory failure ([place]) 09/26/2020  . Sleep apnea 08/13/2003    Past Surgical History:  Procedure  Laterality Date  . APPENDECTOMY    . CATARACT EXTRACTION    . COLONOSCOPY    . HERNIA REPAIR    . RIGHT HEART CATHETERIZATION WITH ADENOSINE STUDY    . UPPER GI ENDOSCOPY      Current Medications: Current Meds  Medication Sig  . acetaminophen (TYLENOL) 500 MG tablet Take 500 mg by mouth every 6 (six) hours as needed.  Marland Kitchen amiodarone (PACERONE) 200 MG tablet Take 200 mg by mouth daily.  Marland Kitchen apixaban (ELIQUIS) 5 MG TABS tablet Take 5 mg by mouth 2 (two) times daily.  Marland Kitchen aspirin EC 81 MG tablet Take 1 tablet (81 mg total) by mouth daily. Swallow whole.  Marland Kitchen CALCIUM CITRATE PO Take 1 tablet by mouth 2 (two) times daily.  . cyproheptadine (PERIACTIN) 4 MG tablet Take 4 mg by mouth in the morning, at noon, and at bedtime.  [name] Sodium (DSS) 100 MG CAPS Take 1 tablet by mouth as needed.  . ferrous sulfate 325 (65 FE) MG tablet Take 325 mg by mouth daily.  Marland Kitchen Fexofenadine HCl (ALLEGRA ALLERGY PO) Take 1 tablet by mouth every morning.  . fluticasone (FLONASE) 50 MCG/ACT nasal spray Place 2 sprays into both nostrils 2 (two) times daily.  . fluticasone furoate-vilanterol (BREO ELLIPTA) 100-25 MCG/INH AEPB Inhale 1 puff into the lungs daily.  . furosemide (LASIX) 20 MG tablet Take 20 mg by mouth daily.  . halobetasol (ULTRAVATE) 0.05 % cream Apply 1 application topically 2 (two) times daily.  Marland Kitchen levocetirizine (XYZAL) 5 MG tablet Take 5 mg by mouth daily.  Marland Kitchen levothyroxine (SYNTHROID) 88 MCG tablet Take 1 tablet (88 mcg total) by mouth daily.  Marland Kitchen losartan (COZAAR) 50 MG tablet Take 50 mg by mouth in the morning.  . metoprolol succinate (TOPROL-XL) 50 MG 24 hr tablet Take 50 mg by mouth daily. Take with or immediately following a meal.  . montelukast (SINGULAIR) 10 MG tablet Take 10 mg by mouth daily.  . Multiple Vitamin (MULTIVITAMIN PO) Take 1 tablet by mouth daily.  . pantoprazole (PROTONIX) 40 MG tablet Take 40 mg by mouth daily.  . rosuvastatin (CRESTOR) 20 MG tablet Take 20 mg by mouth daily.  .  Saw Palmetto, Serenoa repens, (SAW PALMETTO FRUIT PO) Take 450 mg by mouth 2 (two) times daily.  . [DISCONTINUED] clopidogrel (PLAVIX) 75 MG tablet Take 75 mg by mouth daily.     Allergies:   Lisinopril, Telmisartan, Atorvastatin, and Sulfa antibiotics   Social History   Socioeconomic History  . Marital status: Widowed    Spouse name: Not on file  . Number of children: Not on file  . Years of education: Not on file  . Highest education level: Not on file  Occupational History  . Not on file  Tobacco Use  . Smoking status: Former Smoker    Packs/day: 0.25    Types: Cigarettes    Quit date: 12/17/1958    Years since quitting: 62.1  . Smokeless  tobacco: Never Used  Vaping Use  . Vaping Use: Never used  Substance and Sexual Activity  . Alcohol use: Yes    Alcohol/week: 3.0 standard drinks    Types: 3 Standard drinks or equivalent per week  . Drug use: Never  . Sexual activity: Not Currently  Other Topics Concern  . Not on file  Social History Narrative   Tobacco use, amount per day now: 0   Past tobacco use, amount per day: Less than 1 pack   How many years did you use tobacco: 5 years, stopped in 1960   Alcohol use (drinks per week): 4   Diet: N/A   Do you drink/eat things with caffeine: Yes.   Marital status: Widowed                                  What year were you married? 1960   Do you live in a house, apartment, assisted living, condo, trailer, etc.? Assisted Living.   Is it one or more stories? 1    How many persons live in your home? 1   Do you have pets in your home?( please list) No.   Highest Level of education completed? College   Current or past profession: Pharmacist   Do you exercise?  No.                                Type and how often?   Do you have a living will? Yes.   Do you have a DNR form?  Yes.                                 If not, do you want to discuss one?   Do you have signed POA/HPOA forms? Yes.                       If so, please bring to  you appointment      Do you have any difficulty bathing or dressing yourself? Yes.   Do you have any difficulty preparing food or eating? Yes.   Do you have any difficulty managing your medications? Yes.   Do you have any difficulty managing your finances? No.   Do you have any difficulty affording your medications? No.   Social Determinants of Health   Financial Resource Strain: Not on file  Food Insecurity: Not on file  Transportation Needs: Not on file  Physical Activity: Not on file  Stress: Not on file  Social Connections: Not on file     Family History: The patient's family history includes Brain cancer in his brother; Breast cancer in his daughter; Celiac disease in his daughter; Diabetes Mellitus I in his son; Heart attack in his father; Lung disease in his mother.  ROS:   Please see the history of present illness.    All other systems reviewed and are negative.  EKGs/Labs/Other Studies Reviewed:    The following studies were reviewed today:   EKG:    4/22- EKG was not ordered today 2/22- sinus bradycardia, rate 58, first-degree AV block, inferior Q waves, RBBB  Recent Labs: 11/26/2020: ALT 17 12/10/2020: Brain Natriuretic Peptide 361; BUN 15; Creat 1.37; Hemoglobin 11.1; Platelets 332; Potassium 4.7; Sodium 145; TSH 9.96  Recent Lipid Panel  Component Value Date/Time   CHOL 147 12/10/2020 0000   TRIG 176 (H) 12/10/2020 0000   HDL 51 12/10/2020 0000   CHOLHDL 2.9 12/10/2020 0000   LDLCALC 71 12/10/2020 0000    Physical Exam:    VS:  BP (!) 138/50 (BP Location: Right Arm, Patient Position: Sitting, Cuff Size: Normal)   Pulse 64   Ht 5\' 8"  (1.727 m)   Wt 169 lb (76.7 kg)   BMI 25.70 kg/m     Wt Readings from Last 3 Encounters:  01/10/21 169 lb (76.7 kg)  12/17/20 167 lb (75.8 kg)  12/10/20 172 lb 3.2 oz (78.1 kg)     GEN:  in no acute distress HEENT: Normal NECK: No JVD CARDIAC: RRR, 2/6 systolic murmur RESPIRATORY:  Clear to auscultation without  rales, wheezing or rhonchi  ABDOMEN: Soft, non-tender, non-distended MUSCULOSKELETAL:  Trace edema in bilateral LE; No deformity  SKIN: Warm and dry NEUROLOGIC:  Alert and oriented x 3 PSYCHIATRIC:  Normal affect   ASSESSMENT:    1. CAD in native artery   2. Chronic combined systolic and diastolic heart failure ([place])   3. Essential hypertension   4. Acquired hypothyroidism   5. Atrial fibrillation, unspecified type ([place])    PLAN:    CAD: Cardiac catheterization on 06/01/2019 showed tandem diffuse 90% stenosis of mid LAD status post DES, 95% distal LCx stenosis status post DES.  Admitted in [place] with NSTEMI 09/2020.  In setting of GI bleed, medical management was recommended. -On Plavix plus Eliquis.  Given has been over a year since his last stent and considering recent GI bleeding issues and falls, recommended switching to aspirin plus Eliquis.   -Continue rosuvastatin 20 mg daily  Chronic combined systolic and diastolic heart failure: EF 35-40% on most recent echo 09/2020.  On Toprol-XL 50 mg daily and losartan 50 mg daily. -Continue Toprol-XL 50 mg daily -Continue losartan 50 mg daily -Continue Lasix 20 mg daily.   -Check BMP, if stable then will add spironolactone 12.5 mg daily  Atrial fibrillation: Started on amiodarone during recent admission.  Currently in sinus rhythm.  CHA2DS2-VASc score 5 (CHF, hypertension, age x2, CAD) -Continue amiodarone 200 mg daily -Continue Toprol-XL 50 mg daily -Continue Eliquis 5 mg twice daily.  Most recent labs showed creatinine 1.37, does not meet criteria for reduced dose of Eliquis -While he has an elevated CHA2DS2-VASc score, he is a high risk anticoagulation candidate given his history of GI bleeding and falls.  Referred to Dr. Quentin Ore for [facility] evaluation, recommended monitoring for now, if have further bleeding consider Watchman  Anemia: Recent GI bleed, has iron deficiency anemia.  Hemoglobin appears stable at 11.0 on  recent labs  Hypertension: On Toprol-XL and losartan.  Appears controlled  Hyperlipidemia: On rosuvastatin 20 mg daily.  LDL 71 on 12/10/2020  Hypothyroidism: TSH has been elevated, PCP adjusting levothyroxine dose.  Will check TSH  RTC in 2 months   Medication Adjustments/Labs and Tests Ordered: Current medicines are reviewed at length with the patient today.  Concerns regarding medicines are outlined above.  Orders Placed This Encounter  Procedures  . Basic metabolic panel  . Magnesium  . TSH   Meds ordered this encounter  Medications  . aspirin EC 81 MG tablet    Sig: Take 1 tablet (81 mg total) by mouth daily. Swallow whole.    Dispense:  30 tablet    Refill:  11    Patient Instructions  Medication Instructions:  Stop Plavix Start  Aspirin 81 mg daily Continue all other medications *If you need a refill on your cardiac medications before your next appointment, please call your pharmacy*   Lab Work: Bmet,magnesium,tsh today If you have labs (blood work) drawn today and your tests are completely normal, you will receive your results only by: Marland Kitchen MyChart Message (if you have MyChart) OR . A paper copy in the mail If you have any lab test that is abnormal or we need to change your treatment, we will call you to review the results.   Testing/Procedures: None ordered   Follow-Up: At [facility], you and your health needs are our priority.  As part of our continuing mission to provide you with exceptional heart care, we have created designated Provider Care Teams.  These Care Teams include your primary Cardiologist (physician) and Advanced Practice Providers (APPs -  Physician Assistants and Nurse Practitioners) who all work together to provide you with the care you need, when you need it.  We recommend signing up for the patient portal called "MyChart".  Sign up information is provided on this After Visit Summary.  MyChart is used to connect with patients for  Virtual Visits (Telemedicine).  Patients are able to view lab/test results, encounter notes, upcoming appointments, etc.  Non-urgent messages can be sent to your provider as well.   To learn more about what you can do with MyChart, go to [email or web address].    Your next appointment:  2 months   The format for your next appointment: Office    Provider:  Dr.Laveda Demedeiros    Schedule appointment with Pharmacist in [facility] in 2 weeks     I,Alexis Bryant,acting as a scribe for Donato Heinz, MD.,have documented all relevant documentation on the behalf of Donato Heinz, MD,as directed by  Donato Heinz, MD while in the presence of Donato Heinz, MD.  Signed, Donato Heinz, MD  01/10/2021 9:03 AM    [place]

## 2021-01-10 NOTE — Patient Instructions (Addendum)
Medication Instructions:  Stop Plavix Start Aspirin 81 mg daily Continue all other medications *If you need a refill on your cardiac medications before your next appointment, please call your pharmacy*   Lab Work: Bmet,magnesium,tsh today If you have labs (blood work) drawn today and your tests are completely normal, you will receive your results only by: Marland Kitchen MyChart Message (if you have MyChart) OR . A paper copy in the mail If you have any lab test that is abnormal or we need to change your treatment, we will call you to review the results.   Testing/Procedures: None ordered   Follow-Up: At [facility], you and your health needs are our priority.  As part of our continuing mission to provide you with exceptional heart care, we have created designated Provider Care Teams.  These Care Teams include your primary Cardiologist (physician) and Advanced Practice Providers (APPs -  Physician Assistants and Nurse Practitioners) who all work together to provide you with the care you need, when you need it.  We recommend signing up for the patient portal called "MyChart".  Sign up information is provided on this After Visit Summary.  MyChart is used to connect with patients for Virtual Visits (Telemedicine).  Patients are able to view lab/test results, encounter notes, upcoming appointments, etc.  Non-urgent messages can be sent to your provider as well.   To learn more about what you can do with MyChart, go to [email or web address].    Your next appointment:  2 months   The format for your next appointment: Office    Provider:  Dr.Schumann    Schedule appointment with Pharmacist in [facility] in 2 weeks

## 2021-01-10 NOTE — Telephone Encounter (Signed)
Faxed as requested via EPIC fax function

## 2021-01-10 NOTE — Telephone Encounter (Signed)
Ronnie Joseph is asking that the results from 2/15 and 3/1 be faxed over. She stated she havent received it. Fax [phone]

## 2021-01-13 NOTE — Telephone Encounter (Signed)
Advised daughter would fax facility today

## 2021-01-13 NOTE — Telephone Encounter (Signed)
Faxed, confirmation received 

## 2021-01-13 NOTE — Telephone Encounter (Signed)
Pt's daughter Arby Barrette is returning call for Dr. Gilman Schmidt from Saturday. She is asking if the proper paperwork has been faxed to:                      [facility]  Fax# [phone]

## 2021-01-13 NOTE — Addendum Note (Signed)
Addended by: Alvina Filbert B on: 01/13/2021 03:32 PM   Modules accepted: Orders

## 2021-01-13 NOTE — Addendum Note (Signed)
Addended by: Alvina Filbert B on: 01/13/2021 03:35 PM   Modules accepted: Orders

## 2021-01-14 ENCOUNTER — Telehealth: Payer: Self-pay | Admitting: Cardiology

## 2021-01-14 ENCOUNTER — Other Ambulatory Visit: Payer: Self-pay | Admitting: *Deleted

## 2021-01-14 MED ORDER — FUROSEMIDE 20 MG PO TABS: 20.0000 mg | ORAL_TABLET | ORAL | 3 refills | Status: DC | PRN

## 2021-01-14 NOTE — Telephone Encounter (Signed)
Faxed updated rx for Lasix 20 mg PRN for weight increase of 3lbs overnight or 5 lbs in 1 week per Dr. Gardiner Rhyme

## 2021-01-14 NOTE — Telephone Encounter (Signed)
Patient aware of Pharm D appointment scheduled Monday 01/27/21 at 10:30 am here at [facility].  Will mail information to patient.

## 2021-01-18 LAB — BASIC METABOLIC PANEL
BUN/Creatinine Ratio: 16 (ref 10–24)
BUN: 21 mg/dL (ref 8–27)
CO2: 22 mmol/L (ref 20–29)
Calcium: 9.5 mg/dL (ref 8.6–10.2)
Chloride: 104 mmol/L (ref 96–106)
Creatinine, Ser: 1.32 mg/dL — ABNORMAL HIGH (ref 0.76–1.27)
Glucose: 80 mg/dL (ref 65–99)
Potassium: 5 mmol/L (ref 3.5–5.2)
Sodium: 142 mmol/L (ref 134–144)
eGFR: 52 mL/min/{1.73_m2} — ABNORMAL LOW (ref >59)

## 2021-01-27 ENCOUNTER — Ambulatory Visit (INDEPENDENT_AMBULATORY_CARE_PROVIDER_SITE_OTHER): Payer: Medicare Other | Admitting: Pharmacist Clinician (PhC)/ Clinical Pharmacy Specialist

## 2021-01-27 ENCOUNTER — Other Ambulatory Visit: Payer: Self-pay

## 2021-01-27 VITALS — BP 128/52 | HR 60 | Resp 15 | Ht 68.0 in | Wt 168.0 lb

## 2021-01-27 DIAGNOSIS — I2510 Atherosclerotic heart disease of native coronary artery without angina pectoris: Secondary | ICD-10-CM

## 2021-01-27 DIAGNOSIS — I5042 Chronic combined systolic (congestive) and diastolic (congestive) heart failure: Secondary | ICD-10-CM

## 2021-01-27 DIAGNOSIS — I110 Hypertensive heart disease with heart failure: Secondary | ICD-10-CM | POA: Diagnosis not present

## 2021-01-27 MED ORDER — EMPAGLIFLOZIN 10 MG PO TABS: 10.0000 mg | ORAL_TABLET | Freq: Every day | ORAL | 0 refills | Status: DC

## 2021-01-27 NOTE — Progress Notes (Signed)
01/27/2021 Ronnie Joseph [birth year] [id number]   HPI:  Ronnie Joseph is a 87 y.o. male patient of Dr Gardiner Rhyme, with a PMH below who presents today for heart failure medication titration.  His most recent echocardiogram showed combined systolic/diastolic heart failure with an EF of 35-40%.  Currently taking metoprolol succ, losartan and furosemide.   He had labs drawn last week to check metabolic panel in hopes of getting him fully on GDMT.    Mr Ronnie Joseph is here today with his daughter.  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  He currently lives in assisted living and gets around the facility with a walker.   Today he reports feeling well overall, still some slight edema in his lower legs.  He has been treated for anemia and suspected GI bleed, but he noted that despite endoscopy and colonoscopy both twice in the past several months, no source of the bleed has been found.  He continues to take oral iron supplementation and notes that it does turn his stool black.     Past Medical History: ASCVD DES to midLAD, distal LCx, NSTEMI 12/21, asa/Eliquis d/t GI bleed  Atrial fibrillation CHADS2-VASc score 5, on amiodarone, metoprolol succ, Eliquis; referred for Watchman evaluation  anemia Recent GI bleed, iron deficiency anemia, most recent hgb stable at 11.0, on oral iron supplement  hypertension Controlled with current medications  hyperlipidemia 3/22 - LDL at 71 on rosuvastatin   hypothyrodism 3/22 - TSH 9.96, levothyroxine increased to 88 mcg      Blood Pressure Goal:  130/80  Current Medications:  Metoprolol succ 50 mg qd, losartan 50 mg qd, furosemide 20 mg qd   Social Hx: no tobacco, scotch daily; coffee 2-3 cups per day  Diet: lives in assisted living - lots of carbs; tries to watch and not eat some of these; does get fruit/veggies  Exercise: PT/OT 2-3 times per week,   Home BP readings: nothing >209 systolic; diastolic  readings are mostly in the 50's  Intolerances: lisinopril - swelling, telmisartan - hives  Labs: 01/17/21:  Na 142, K 5.0, Glu 80, BUN 21, SCr 1.32   Wt Readings from Last 3 Encounters:  01/27/21 168 lb (76.2 kg)  01/10/21 169 lb (76.7 kg)  12/17/20 167 lb (75.8 kg)   BP Readings from Last 3 Encounters:  01/27/21 (!) 128/52  01/10/21 (!) 138/50  12/17/20 (!) 128/54   Pulse Readings from Last 3 Encounters:  01/27/21 60  01/10/21 64  12/17/20 62    Current Outpatient Medications  Medication Sig Dispense Refill  . acetaminophen (TYLENOL) 500 MG tablet Take 500 mg by mouth every 6 (six) hours as needed.    Marland Kitchen amiodarone (PACERONE) 200 MG tablet Take 200 mg by mouth daily.    Marland Kitchen apixaban (ELIQUIS) 5 MG TABS tablet Take 5 mg by mouth 2 (two) times daily.    Marland Kitchen aspirin EC 81 MG tablet Take 1 tablet (81 mg total) by mouth daily. Swallow whole. 30 tablet 11  . CALCIUM CITRATE PO Take 1 tablet by mouth 2 (two) times daily.    . cyproheptadine (PERIACTIN) 4 MG tablet Take 4 mg by mouth in the morning, at noon, and at bedtime.    [name] Sodium (DSS) 100 MG CAPS Take 1 tablet by mouth as needed.    . ferrous sulfate 325 (65 FE) MG tablet Take 325 mg by mouth daily.    Marland Kitchen Fexofenadine HCl (  ALLEGRA ALLERGY PO) Take 1 tablet by mouth every morning.    . fluticasone (FLONASE) 50 MCG/ACT nasal spray Place 2 sprays into both nostrils 2 (two) times daily.    . fluticasone furoate-vilanterol (BREO ELLIPTA) 100-25 MCG/INH AEPB Inhale 1 puff into the lungs daily.    . furosemide (LASIX) 20 MG tablet Take 1 tablet (20 mg total) by mouth as needed (weight increase of 3 lbs in 1 day or 5 lbs in 1 week). 30 tablet 3  . halobetasol (ULTRAVATE) 0.05 % cream Apply 1 application topically 2 (two) times daily.    Marland Kitchen levocetirizine (XYZAL) 5 MG tablet Take 5 mg by mouth daily.    Marland Kitchen levothyroxine (SYNTHROID) 88 MCG tablet Take 1 tablet (88 mcg total) by mouth daily. 90 tablet 3  . losartan (COZAAR) 50 MG tablet  Take 50 mg by mouth in the morning.    . metoprolol succinate (TOPROL-XL) 50 MG 24 hr tablet Take 50 mg by mouth daily. Take with or immediately following a meal.    . montelukast (SINGULAIR) 10 MG tablet Take 10 mg by mouth daily.    . Multiple Vitamin (MULTIVITAMIN PO) Take 1 tablet by mouth daily.    . pantoprazole (PROTONIX) 40 MG tablet Take 40 mg by mouth daily.    . rosuvastatin (CRESTOR) 20 MG tablet Take 20 mg by mouth daily.    . Saw Palmetto, Serenoa repens, (SAW PALMETTO FRUIT PO) Take 450 mg by mouth 2 (two) times daily.     No current facility-administered medications for this visit.    Allergies  Allergen Reactions  . Lisinopril Swelling  . Telmisartan Hives  . Atorvastatin Rash  . Sulfa Antibiotics Rash    Past Medical History:  Diagnosis Date  . Acute anemia 09/26/2020  . Anemia due to acute blood loss 05/25/2019  . Chronic maxillary sinusitis 08/06/2017  . Coronary artery disease due to lipid rich plaque 07/27/2019  . Essential hypertension 02/14/2003  . Fainting 05/30/2019  . Fall 05/25/1999  . High cholesterol 03/14/2001  . History of basal cell carcinoma 11/02/2019  . History of pneumonia   . History of snoring   . History of upper respiratory infection   . Irregular heartbeat 08/06/2003  . Ischemic cardiomyopathy 05/30/2019  . Mild cognitive impairment 05/15/2019  . NSTEMI (non-ST elevated myocardial infarction) ([place]) 06/03/2019  . Poor renal function 03/19/2005  . Primary osteoarthritis of shoulder 10/24/2015  . Psoriasis 12/29/2004  . Rash and nonspecific skin eruption 04/30/2020  . Schatzki's ring 08/07/2020  . Sepsis with acute hypoxic respiratory failure ([place]) 09/26/2020  . Sleep apnea 08/13/2003    Blood pressure (!) 128/52, pulse 60, resp. rate 15, height 5\' 8"  (1.727 m), weight 168 lb (76.2 kg), SpO2 95 %.  Hypertensive heart disease with heart failure ([place]) Patient with HFrEF currently on losartan and metoprolol succinate.  Would like to  put him on spironolactone and SGLT-2 inhibitor to get him fully on GDMT.  Most recent labs (last week) showed an improved renal function, but with a potassium level of 5, will not be able to safely add spironolactone.  Explained current guideline therapy and purpose of adding SGLT-2 inhibitor.  Patient agreeable to start, so gave Jardiance 10 mg samples.  He will try for 2 weeks then call office to let us know if any problems or concerns.  If he tolerates well, will send prescription to facility for him to continue.  He will need to repeat metabolic panel in about 4 weeks  and follow up with Dr. Gardiner Rhyme in 6 weeks.     Tommy Medal PharmD CPP [facility] [address] [place], [place] [address] [phone]

## 2021-01-27 NOTE — Assessment & Plan Note (Signed)
Patient with HFrEF currently on losartan and metoprolol succinate.  Would like to put him on spironolactone and SGLT-2 inhibitor to get him fully on GDMT.  Most recent labs (last week) showed an improved renal function, but with a potassium level of 5, will not be able to safely add spironolactone.  Explained current guideline therapy and purpose of adding SGLT-2 inhibitor.  Patient agreeable to start, so gave Jardiance 10 mg samples.  He will try for 2 weeks then call office to let us know if any problems or concerns.  If he tolerates well, will send prescription to facility for him to continue.  He will need to repeat metabolic panel in about 4 weeks and follow up with Dr. Gardiner Rhyme in 6 weeks.

## 2021-01-27 NOTE — Patient Instructions (Signed)
Return for a a follow up appointment with Dr. Gardiner Rhyme on June 2  Go to the lab in about 1 month.    Take your meds as follows:  Start Jardiance 10 mg once daily.   Call the office in 10-14 days to let us know if there is any problem with this.  Cassidee Deats/Raquel at [phone]  Bring all of your meds, your BP cuff and your record of home blood pressures to your next appointment.  Exercise as you're able, try to walk approximately 30 minutes per day.  Keep salt intake to a minimum, especially watch canned and prepared boxed foods.  Eat more fresh fruits and vegetables and fewer canned items.  Avoid eating in fast food restaurants.    HOW TO TAKE YOUR BLOOD PRESSURE: . Rest 5 minutes before taking your blood pressure. .  Don't smoke or drink caffeinated beverages for at least 30 minutes before. . Take your blood pressure before (not after) you eat. . Sit comfortably with your back supported and both feet on the floor (don't cross your legs). . Elevate your arm to heart level on a table or a desk. . Use the proper sized cuff. It should fit smoothly and snugly around your bare upper arm. There should be enough room to slip a fingertip under the cuff. The bottom edge of the cuff should be 1 inch above the crease of the elbow. . Ideally, take 3 measurements at one sitting and record the average.

## 2021-02-13 ENCOUNTER — Telehealth: Payer: Self-pay | Admitting: *Deleted

## 2021-02-13 NOTE — Telephone Encounter (Addendum)
Received notification from [facility]: "Resident is refusing his daily weigh. He stated that he was taken it the past 2 months and he doesn't see any difference.  He wants to stop taking it.    Please advise or send a d/c order to stop taking daily weights".      Order sent to d/c daily weights and weigh once weekly per Dr. Gardiner Rhyme

## 2021-02-14 ENCOUNTER — Other Ambulatory Visit: Payer: Self-pay

## 2021-02-14 MED ORDER — EMPAGLIFLOZIN 10 MG PO TABS: 10.0000 mg | ORAL_TABLET | Freq: Every day | ORAL | 1 refills | Status: DC

## 2021-03-10 NOTE — Progress Notes (Deleted)
Cardiology Office Note:    Date:  03/10/2021   ID:  Ronnie Joseph, DOB 17-Jun-1932, MRN [id number]  PCP:  [name], NP  Cardiologist:  None  Electrophysiologist:  Vickie Epley, MD   Referring MD: [name], NP   No chief complaint on file.   History of Present Illness:    Ronnie Joseph is a 87 y.o. male with a hx of CAD status post PCI to LAD and LCx in 05/2019, ischemic cardiomyopathy (EF 25% post PCI, improved to 35-40 % on most recent echo), atrial fibrillation on Eliquis who presents for an initial visit.  His previous cardiologist was [name] in [facility].  Echocardiogram 09/26/2020 in [place] showed LVEF 35 to 40%, mildly reduced RV systolic function.  Cardiac catheterization on 06/01/2019 showed tandem diffuse 90% stenosis of mid LAD status post DES, 95% distal LCx stenosis status post DES.  Echocardiogram on 05/26/2019 showed LVEF 25 to 30%.  He was admitted from 12/16 through 10/02/20 with NSTEMI in [place].  He presented to ED with chest pain.  Was also found to have fever to 100.8, lactic acid of 12, hemoglobin 7.5.  Troponin peaked at 8300.  He received 2 units PRBCs.  He was evaluated by cardiology, who recommended medical management.  He has a history of GI bleeds with AVMs.  Underwent EGD/colonoscopy on 09/30/20 which showed duodenal AVMs status post APC and colonoscopy showed ascending colon AVM status post APC.  During admission was tolerating Plavix and apixaban without any further bleeding.  He was started on amiodarone for his atrial fibrillation, with plans for amiodarone 200 mg twice daily x1 month then 200 mg daily.  Since last clinic visit,  He recently moved to [place] from [place] to be close to his daughter.  Reports he continues to have black stools but has been taking iron supplements.  He denies any chest pain since discharge from hospital.  Denies any shortness of breath, lightheadedness, syncope, or palpitations.  Reports he has not  been weighing himself daily but weight has been stable when he checks.  Does report he has had unsteadiness on his feet and has had some falls.   He is accompanied by daughter at this visit. Since last clinic visit, he is still having mild LE edema after increasing his fluid medications. He has been taking his weight at his assistant living facility and it has been stable. He has no chest pain or tightness. He has no palpitations, lightheadedness, orthopnea, or PND. He is still have black stools from the extra iron that he is taking but nothing that he is concerned about. He notes falling on the floor in his bathroom one night but denies hitting his head or LOC. He was not using his walker during this incident which he contributes to his fall.    Wt Readings from Last 3 Encounters:  01/27/21 168 lb (76.2 kg)  01/10/21 169 lb (76.7 kg)  12/17/20 167 lb (75.8 kg)    Past Medical History:  Diagnosis Date  . Acute anemia 09/26/2020  . Anemia due to acute blood loss 05/25/2019  . Chronic maxillary sinusitis 08/06/2017  . Coronary artery disease due to lipid rich plaque 07/27/2019  . Essential hypertension 02/14/2003  . Fainting 05/30/2019  . Fall 05/25/1999  . High cholesterol 03/14/2001  . History of basal cell carcinoma 11/02/2019  . History of pneumonia   . History of snoring   . History of upper respiratory infection   . Irregular  heartbeat 08/06/2003  . Ischemic cardiomyopathy 05/30/2019  . Mild cognitive impairment 05/15/2019  . NSTEMI (non-ST elevated myocardial infarction) ([place]) 06/03/2019  . Poor renal function 03/19/2005  . Primary osteoarthritis of shoulder 10/24/2015  . Psoriasis 12/29/2004  . Rash and nonspecific skin eruption 04/30/2020  . Schatzki's ring 08/07/2020  . Sepsis with acute hypoxic respiratory failure ([place]) 09/26/2020  . Sleep apnea 08/13/2003    Past Surgical History:  Procedure Laterality Date  . APPENDECTOMY    . CATARACT EXTRACTION    . COLONOSCOPY     . HERNIA REPAIR    . RIGHT HEART CATHETERIZATION WITH ADENOSINE STUDY    . UPPER GI ENDOSCOPY      Current Medications: No outpatient medications have been marked as taking for the 03/13/21 encounter (Appointment) with Donato Heinz, MD.     Allergies:   Lisinopril, Telmisartan, Atorvastatin, and Sulfa antibiotics   Social History   Socioeconomic History  . Marital status: Widowed    Spouse name: Not on file  . Number of children: Not on file  . Years of education: Not on file  . Highest education level: Not on file  Occupational History  . Not on file  Tobacco Use  . Smoking status: Former Smoker    Packs/day: 0.25    Types: Cigarettes    Quit date: 12/17/1958    Years since quitting: 62.2  . Smokeless tobacco: Never Used  Vaping Use  . Vaping Use: Never used  Substance and Sexual Activity  . Alcohol use: Yes    Alcohol/week: 3.0 standard drinks    Types: 3 Standard drinks or equivalent per week  . Drug use: Never  . Sexual activity: Not Currently  Other Topics Concern  . Not on file  Social History Narrative   Tobacco use, amount per day now: 0   Past tobacco use, amount per day: Less than 1 pack   How many years did you use tobacco: 5 years, stopped in 1960   Alcohol use (drinks per week): 4   Diet: N/A   Do you drink/eat things with caffeine: Yes.   Marital status: Widowed                                  What year were you married? 1960   Do you live in a house, apartment, assisted living, condo, trailer, etc.? Assisted Living.   Is it one or more stories? 1    How many persons live in your home? 1   Do you have pets in your home?( please list) No.   Highest Level of education completed? College   Current or past profession: Pharmacist   Do you exercise?  No.                                Type and how often?   Do you have a living will? Yes.   Do you have a DNR form?  Yes.                                 If not, do you want to discuss one?   Do  you have signed POA/HPOA forms? Yes.  If so, please bring to you appointment      Do you have any difficulty bathing or dressing yourself? Yes.   Do you have any difficulty preparing food or eating? Yes.   Do you have any difficulty managing your medications? Yes.   Do you have any difficulty managing your finances? No.   Do you have any difficulty affording your medications? No.   Social Determinants of Health   Financial Resource Strain: Not on file  Food Insecurity: Not on file  Transportation Needs: Not on file  Physical Activity: Not on file  Stress: Not on file  Social Connections: Not on file     Family History: The patient's family history includes Brain cancer in his brother; Breast cancer in his daughter; Celiac disease in his daughter; Diabetes Mellitus I in his son; Heart attack in his father; Lung disease in his mother.  ROS:   Please see the history of present illness.    All other systems reviewed and are negative.  EKGs/Labs/Other Studies Reviewed:    The following studies were reviewed today:   EKG:    4/22- EKG was not ordered today 2/22- sinus bradycardia, rate 58, first-degree AV block, inferior Q waves, RBBB  Recent Labs: 11/26/2020: ALT 17 12/10/2020: Brain Natriuretic Peptide 361; Hemoglobin 11.1; Platelets 332 01/10/2021: Magnesium 2.3; TSH 4.000 01/17/2021: BUN 21; Creatinine, Ser 1.32; Potassium 5.0; Sodium 142  Recent Lipid Panel    Component Value Date/Time   CHOL 147 12/10/2020 0000   TRIG 176 (H) 12/10/2020 0000   HDL 51 12/10/2020 0000   CHOLHDL 2.9 12/10/2020 0000   LDLCALC 71 12/10/2020 0000    Physical Exam:    VS:  There were no vitals taken for this visit.    Wt Readings from Last 3 Encounters:  01/27/21 168 lb (76.2 kg)  01/10/21 169 lb (76.7 kg)  12/17/20 167 lb (75.8 kg)     GEN:  in no acute distress HEENT: Normal NECK: No JVD CARDIAC: RRR, 2/6 systolic murmur RESPIRATORY:  Clear to auscultation  without rales, wheezing or rhonchi  ABDOMEN: Soft, non-tender, non-distended MUSCULOSKELETAL:  Trace edema in bilateral LE; No deformity  SKIN: Warm and dry NEUROLOGIC:  Alert and oriented x 3 PSYCHIATRIC:  Normal affect   ASSESSMENT:    No diagnosis found. PLAN:    CAD: Cardiac catheterization on 06/01/2019 showed tandem diffuse 90% stenosis of mid LAD status post DES, 95% distal LCx stenosis status post DES.  Admitted in [place] with NSTEMI 09/2020.  In setting of GI bleed, medical management was recommended. -Continue aspirin plus Eliquis.   -Continue rosuvastatin 20 mg daily  Chronic combined systolic and diastolic heart failure: EF 35-40% on most recent echo 09/2020.  On Toprol-XL 50 mg daily and losartan 50 mg daily. -Continue Toprol-XL 50 mg daily -Continue losartan 50 mg daily -Continue Jardiance 10 mg daily -Continue Lasix 20 mg daily as needed for weight gain greater than 3 pounds in 1 day or 5 pounds in 1 week -Check BMP, if stable then will add spironolactone 12.5 mg daily  Atrial fibrillation: Started on amiodarone during recent admission.  Currently in sinus rhythm.  CHA2DS2-VASc score 5 (CHF, hypertension, age x2, CAD) -Continue amiodarone 200 mg daily -Continue Toprol-XL 50 mg daily -Continue Eliquis 5 mg twice daily.  Most recent labs showed creatinine 1.37, does not meet criteria for reduced dose of Eliquis -While he has an elevated CHA2DS2-VASc score, he is a high risk anticoagulation candidate given his history of  GI bleeding and falls.  Referred to Dr. Quentin Ore for [facility] evaluation, recommended monitoring for now, if have further bleeding consider Watchman  Anemia: Recent GI bleed, has iron deficiency anemia.  Hemoglobin appears stable at 11.0 on recent labs  Hypertension: On Toprol-XL and losartan.  Appears controlled  Hyperlipidemia: On rosuvastatin 20 mg daily.  LDL 71 on 12/10/2020  Hypothyroidism: TSH has been elevated, PCP adjusting  levothyroxine dose.  Will check TSH  RTC in ***   Medication Adjustments/Labs and Tests Ordered: Current medicines are reviewed at length with the patient today.  Concerns regarding medicines are outlined above.  No orders of the defined types were placed in this encounter.  No orders of the defined types were placed in this encounter.   There are no Patient Instructions on file for this visit.   Signed, Donato Heinz, MD  03/10/2021 9:07 PM    [place]

## 2021-03-13 ENCOUNTER — Encounter: Payer: Self-pay | Admitting: Cardiology

## 2021-03-13 ENCOUNTER — Ambulatory Visit (INDEPENDENT_AMBULATORY_CARE_PROVIDER_SITE_OTHER): Payer: Medicare Other | Admitting: Cardiology

## 2021-03-13 ENCOUNTER — Other Ambulatory Visit: Payer: Self-pay

## 2021-03-13 VITALS — BP 132/50 | HR 59 | Ht 68.0 in | Wt 168.6 lb

## 2021-03-13 DIAGNOSIS — I4891 Unspecified atrial fibrillation: Secondary | ICD-10-CM

## 2021-03-13 DIAGNOSIS — E785 Hyperlipidemia, unspecified: Secondary | ICD-10-CM

## 2021-03-13 DIAGNOSIS — D649 Anemia, unspecified: Secondary | ICD-10-CM

## 2021-03-13 DIAGNOSIS — K31819 Angiodysplasia of stomach and duodenum without bleeding: Secondary | ICD-10-CM

## 2021-03-13 DIAGNOSIS — I10 Essential (primary) hypertension: Secondary | ICD-10-CM

## 2021-03-13 DIAGNOSIS — I5042 Chronic combined systolic (congestive) and diastolic (congestive) heart failure: Secondary | ICD-10-CM

## 2021-03-13 DIAGNOSIS — I2510 Atherosclerotic heart disease of native coronary artery without angina pectoris: Secondary | ICD-10-CM

## 2021-03-13 LAB — IRON,TIBC AND FERRITIN PANEL
Ferritin: 47 ng/mL (ref 30–400)
Iron Saturation: 24 % (ref 15–55)
Iron: 70 ug/dL (ref 38–169)
Total Iron Binding Capacity: 290 ug/dL (ref 250–450)
UIBC: 220 ug/dL (ref 111–343)

## 2021-03-13 LAB — COMPREHENSIVE METABOLIC PANEL
ALT: 11 IU/L (ref 0–44)
AST: 16 IU/L (ref 0–40)
Albumin/Globulin Ratio: 1.7 (ref 1.2–2.2)
Albumin: 4.3 g/dL (ref 3.6–4.6)
Alkaline Phosphatase: 47 IU/L (ref 44–121)
BUN/Creatinine Ratio: 11 (ref 10–24)
BUN: 18 mg/dL (ref 8–27)
Bilirubin Total: 0.3 mg/dL (ref 0.0–1.2)
CO2: 24 mmol/L (ref 20–29)
Calcium: 9.7 mg/dL (ref 8.6–10.2)
Chloride: 102 mmol/L (ref 96–106)
Creatinine, Ser: 1.58 mg/dL — ABNORMAL HIGH (ref 0.76–1.27)
Globulin, Total: 2.5 g/dL (ref 1.5–4.5)
Glucose: 124 mg/dL — ABNORMAL HIGH (ref 65–99)
Potassium: 4.8 mmol/L (ref 3.5–5.2)
Sodium: 143 mmol/L (ref 134–144)
Total Protein: 6.8 g/dL (ref 6.0–8.5)
eGFR: 42 mL/min/{1.73_m2} — ABNORMAL LOW (ref >59)

## 2021-03-13 LAB — CBC
Hematocrit: 35.8 % — ABNORMAL LOW (ref 37.5–51.0)
Hemoglobin: 11.9 g/dL — ABNORMAL LOW (ref 13.0–17.7)
MCH: 34.4 pg — ABNORMAL HIGH (ref 26.6–33.0)
MCHC: 33.2 g/dL (ref 31.5–35.7)
MCV: 104 fL — ABNORMAL HIGH (ref 79–97)
Platelets: 312 10*3/uL (ref 150–450)
RBC: 3.46 x10E6/uL — ABNORMAL LOW (ref 4.14–5.80)
RDW: 12.6 % (ref 11.6–15.4)
WBC: 8.3 10*3/uL (ref 3.4–10.8)

## 2021-03-13 LAB — TSH: TSH: 1.88 u[IU]/mL (ref 0.450–4.500)

## 2021-03-13 NOTE — Progress Notes (Signed)
Cardiology Office Note:    Date:  03/13/2021   ID:  Ronnie Joseph, DOB 11/30/1931, MRN [id number]  PCP:  [name], NP  Cardiologist:  None  Electrophysiologist:  Vickie Epley, MD   Referring MD: [name], NP   Chief Complaint  Patient presents with  . Coronary Artery Disease    History of Present Illness:    Ronnie Joseph is a 87 y.o. male with a hx of CAD status post PCI to LAD and LCx in 05/2019, ischemic cardiomyopathy (EF 25% post PCI, improved to 35-40 % on most recent echo), atrial fibrillation on Eliquis who presents for an initial visit.  His previous cardiologist was [name] in [facility].  Echocardiogram 09/26/2020 in [place] showed LVEF 35 to 40%, mildly reduced RV systolic function.  Cardiac catheterization on 06/01/2019 showed tandem diffuse 90% stenosis of mid LAD status post DES, 95% distal LCx stenosis status post DES.  Echocardiogram on 05/26/2019 showed LVEF 25 to 30%.  He was admitted from 12/16 through 10/02/20 with NSTEMI in [place].  He presented to ED with chest pain.  Was also found to have fever to 100.8, lactic acid of 12, hemoglobin 7.5.  Troponin peaked at 8300.  He received 2 units PRBCs.  He was evaluated by cardiology, who recommended medical management.  He has a history of GI bleeds with AVMs.  Underwent EGD/colonoscopy on 09/30/20 which showed duodenal AVMs status post APC and colonoscopy showed ascending colon AVM status post APC.  During admission was tolerating Plavix and apixaban without any further bleeding.  He was started on amiodarone for his atrial fibrillation, with plans for amiodarone 200 mg twice daily x1 month then 200 mg daily.  Today, he is doing well. He is accompanied by his daughter at this visit. He states that as of recent, he is not taking the Lasix medication. He reports that the Ferrous sulfate medication that he is currently taking is making him constipated. He also states that his stools appear black which  makes it hard to tell if he is also experiencing bleeding. He also states that he is experiencing profuse "drooling" that has been ongoing for months. The drooling occurs randomly. He also states that he is experiencing "quivering" motions.  He has to use both hands to keep the cups from spilling. He denies any chest pain, SOB, palpitations, lightheadedness, syncope, or lower extremity edema.      Wt Readings from Last 3 Encounters:  03/13/21 168 lb 9.6 oz (76.5 kg)  01/27/21 168 lb (76.2 kg)  01/10/21 169 lb (76.7 kg)    Past Medical History:  Diagnosis Date  . Acute anemia 09/26/2020  . Anemia due to acute blood loss 05/25/2019  . Chronic maxillary sinusitis 08/06/2017  . Coronary artery disease due to lipid rich plaque 07/27/2019  . Essential hypertension 02/14/2003  . Fainting 05/30/2019  . Fall 05/25/1999  . High cholesterol 03/14/2001  . History of basal cell carcinoma 11/02/2019  . History of pneumonia   . History of snoring   . History of upper respiratory infection   . Irregular heartbeat 08/06/2003  . Ischemic cardiomyopathy 05/30/2019  . Mild cognitive impairment 05/15/2019  . NSTEMI (non-ST elevated myocardial infarction) ([place]) 06/03/2019  . Poor renal function 03/19/2005  . Primary osteoarthritis of shoulder 10/24/2015  . Psoriasis 12/29/2004  . Rash and nonspecific skin eruption 04/30/2020  . Schatzki's ring 08/07/2020  . Sepsis with acute hypoxic respiratory failure ([place]) 09/26/2020  . Sleep apnea 08/13/2003  Past Surgical History:  Procedure Laterality Date  . APPENDECTOMY    . CATARACT EXTRACTION    . COLONOSCOPY    . HERNIA REPAIR    . RIGHT HEART CATHETERIZATION WITH ADENOSINE STUDY    . UPPER GI ENDOSCOPY      Current Medications: Current Meds  Medication Sig  . acetaminophen (TYLENOL) 500 MG tablet Take 500 mg by mouth every 6 (six) hours as needed.  Marland Kitchen amiodarone (PACERONE) 200 MG tablet Take 200 mg by mouth daily.  Marland Kitchen apixaban (ELIQUIS) 5 MG  TABS tablet Take 5 mg by mouth 2 (two) times daily.  Marland Kitchen aspirin EC 81 MG tablet Take 1 tablet (81 mg total) by mouth daily. Swallow whole.  Marland Kitchen CALCIUM CITRATE PO Take 1 tablet by mouth 2 (two) times daily.  . cyproheptadine (PERIACTIN) 4 MG tablet Take 4 mg by mouth in the morning, at noon, and at bedtime.  [name] Sodium (DSS) 100 MG CAPS Take 1 tablet by mouth as needed.  . empagliflozin (JARDIANCE) 10 MG TABS tablet Take 1 tablet (10 mg total) by mouth daily before breakfast.  . ferrous sulfate 325 (65 FE) MG tablet Take 325 mg by mouth daily.  . fluticasone (FLONASE) 50 MCG/ACT nasal spray Place 2 sprays into both nostrils 2 (two) times daily.  . fluticasone furoate-vilanterol (BREO ELLIPTA) 100-25 MCG/INH AEPB Inhale 1 puff into the lungs daily.  . furosemide (LASIX) 20 MG tablet Take 1 tablet (20 mg total) by mouth as needed (weight increase of 3 lbs in 1 day or 5 lbs in 1 week).  . halobetasol (ULTRAVATE) 0.05 % cream Apply 1 application topically 2 (two) times daily.  Marland Kitchen ketoconazole (NIZORAL) 2 % cream Apply topically daily.  Marland Kitchen levocetirizine (XYZAL) 5 MG tablet Take 5 mg by mouth daily.  Marland Kitchen levothyroxine (SYNTHROID) 88 MCG tablet Take 1 tablet (88 mcg total) by mouth daily.  Marland Kitchen losartan (COZAAR) 50 MG tablet Take 50 mg by mouth in the morning.  . metoprolol succinate (TOPROL-XL) 50 MG 24 hr tablet Take 50 mg by mouth daily. Take with or immediately following a meal.  . montelukast (SINGULAIR) 10 MG tablet Take 10 mg by mouth daily.  . Multiple Vitamin (MULTIVITAMIN PO) Take 1 tablet by mouth daily.  . pantoprazole (PROTONIX) 40 MG tablet Take 40 mg by mouth daily.  . rosuvastatin (CRESTOR) 20 MG tablet Take 20 mg by mouth daily.  . Saw Palmetto, Serenoa repens, (SAW PALMETTO FRUIT PO) Take 450 mg by mouth 2 (two) times daily.     Allergies:   Lisinopril, Telmisartan, Atorvastatin, and Sulfa antibiotics   Social History   Socioeconomic History  . Marital status: Widowed    Spouse  name: Not on file  . Number of children: Not on file  . Years of education: Not on file  . Highest education level: Not on file  Occupational History  . Not on file  Tobacco Use  . Smoking status: Former Smoker    Packs/day: 0.25    Types: Cigarettes    Quit date: 12/17/1958    Years since quitting: 62.2  . Smokeless tobacco: Never Used  Vaping Use  . Vaping Use: Never used  Substance and Sexual Activity  . Alcohol use: Yes    Alcohol/week: 3.0 standard drinks    Types: 3 Standard drinks or equivalent per week  . Drug use: Never  . Sexual activity: Not Currently  Other Topics Concern  . Not on file  Social History Narrative  Tobacco use, amount per day now: 0   Past tobacco use, amount per day: Less than 1 pack   How many years did you use tobacco: 5 years, stopped in 1960   Alcohol use (drinks per week): 4   Diet: N/A   Do you drink/eat things with caffeine: Yes.   Marital status: Widowed                                  What year were you married? 1960   Do you live in a house, apartment, assisted living, condo, trailer, etc.? Assisted Living.   Is it one or more stories? 1    How many persons live in your home? 1   Do you have pets in your home?( please list) No.   Highest Level of education completed? College   Current or past profession: Pharmacist   Do you exercise?  No.                                Type and how often?   Do you have a living will? Yes.   Do you have a DNR form?  Yes.                                 If not, do you want to discuss one?   Do you have signed POA/HPOA forms? Yes.                       If so, please bring to you appointment      Do you have any difficulty bathing or dressing yourself? Yes.   Do you have any difficulty preparing food or eating? Yes.   Do you have any difficulty managing your medications? Yes.   Do you have any difficulty managing your finances? No.   Do you have any difficulty affording your medications? No.   Social  Determinants of Health   Financial Resource Strain: Not on file  Food Insecurity: Not on file  Transportation Needs: Not on file  Physical Activity: Not on file  Stress: Not on file  Social Connections: Not on file     Family History: The patient's family history includes Brain cancer in his brother; Breast cancer in his daughter; Celiac disease in his daughter; Diabetes Mellitus I in his son; Heart attack in his father; Lung disease in his mother.  ROS:   Please see the history of present illness.    All other systems reviewed and are negative.  EKGs/Labs/Other Studies Reviewed:    The following studies were reviewed today:   EKG:    6/22- The EKG ordered demonstrates Sinus rhythm, rate 59, non specific  intraventricular  conduction delay, first degree aV block, poor R wave progression, Q waves in inferior leads,  4/22- EKG was not ordered today 2/22- sinus bradycardia, rate 58, first-degree AV block, inferior Q waves, RBBB  Recent Labs: 12/10/2020: Brain Natriuretic Peptide 361 01/10/2021: Magnesium 2.3 03/13/2021: ALT 11; BUN 18; Creatinine, Ser 1.58; Hemoglobin 11.9; Platelets 312; Potassium 4.8; Sodium 143; TSH 1.880  Recent Lipid Panel    Component Value Date/Time   CHOL 147 12/10/2020 0000   TRIG 176 (H) 12/10/2020 0000   HDL 51 12/10/2020 0000   CHOLHDL 2.9 12/10/2020 0000  [place] 71 12/10/2020 0000    Physical Exam:    VS:  BP (!) 132/50 (BP Location: Left Arm, Patient Position: Sitting) Comment: Has no taken medication yet  Pulse (!) 59   Ht 5\' 8"  (1.727 m)   Wt 168 lb 9.6 oz (76.5 kg)   SpO2 99%   BMI 25.64 kg/m     Wt Readings from Last 3 Encounters:  03/13/21 168 lb 9.6 oz (76.5 kg)  01/27/21 168 lb (76.2 kg)  01/10/21 169 lb (76.7 kg)     GEN:  in no acute distress HEENT: Normal NECK: No JVD CARDIAC: RRR, 2/6 systolic murmur RESPIRATORY:  Clear to auscultation without rales, wheezing or rhonchi  ABDOMEN: Soft, non-tender,  non-distended MUSCULOSKELETAL:  Trace edema in bilateral LE; No deformity  SKIN: Warm and dry NEUROLOGIC:  Alert and oriented x 3 PSYCHIATRIC:  Normal affect   ASSESSMENT:    1. Chronic combined systolic and diastolic heart failure ([place])   2. CAD in native artery   3. Atrial fibrillation, unspecified type ([place])   4. Essential hypertension   5. Hyperlipidemia, unspecified hyperlipidemia type   6. Anemia, unspecified type   7. Gastric AVM    PLAN:    CAD: Cardiac catheterization on 06/01/2019 showed tandem diffuse 90% stenosis of mid LAD status post DES, 95% distal LCx stenosis status post DES.  Admitted in [place] with NSTEMI 09/2020.  In setting of GI bleed, medical management was recommended. -Continue aspirin plus Eliquis.   -Continue rosuvastatin 20 mg daily  Chronic combined systolic and diastolic heart failure: EF 35-40% on most recent echo 09/2020.   -Continue Toprol-XL 50 mg daily -Continue losartan 50 mg daily -Continue Jardiance 10 mg daily -Continue Lasix 20 mg daily as needed for weight gain greater than 3 pounds in 1 day or 5 pounds in 1 week -Check CMP, if stable then will add spironolactone 12.5 mg daily  Atrial fibrillation: Started on amiodarone during recent admission.  Currently in sinus rhythm.  CHA2DS2-VASc score 5 (CHF, hypertension, age x2, CAD) -Continue amiodarone 200 mg daily -Continue Toprol-XL 50 mg daily -Continue Eliquis 5 mg twice daily.  Most recent labs showed creatinine 1.37, does not meet criteria for reduced dose of Eliquis -While he has an elevated CHA2DS2-VASc score, he is a high risk anticoagulation candidate given his history of GI bleeding and falls.  Referred to Dr. Quentin Ore for [facility] evaluation, recommended monitoring for now, if have further bleeding consider Watchman  Anemia: Recent GI bleed, has iron deficiency anemia.    Denies any recent bleeding, reports difficult to tell given black-colored stools on iron supplements.   Reports has been having significant issues with constipation on p.o. iron.  Refer to GI for evaluation.  Hypertension: On Toprol-XL and losartan.  Appears controlled  Hyperlipidemia: On rosuvastatin 20 mg daily.  LDL 71 on 12/10/2020  Hypothyroidism: TSH has been elevated, PCP adjusting levothyroxine dose.  Will check TSH  RTC in 3 months    Medication Adjustments/Labs and Tests Ordered: Current medicines are reviewed at length with the patient today.  Concerns regarding medicines are outlined above.  Orders Placed This Encounter  Procedures  . Fe+TIBC+Fer  . Comprehensive metabolic panel  . TSH  . CBC  . Ambulatory referral to Gastroenterology  . EKG 12-Lead   No orders of the defined types were placed in this encounter.   Patient Instructions  Medication Instructions:  Your physician recommends that you continue on your current medications as directed. Please refer to  the Current Medication list given to you today.  *If you need a refill on your cardiac medications before your next appointment, please call your pharmacy*   Lab Work: CMET, CBC, TSH, Iron panel today  If you have labs (blood work) drawn today and your tests are completely normal, you will receive your results only by: Marland Kitchen MyChart Message (if you have MyChart) OR . A paper copy in the mail If you have any lab test that is abnormal or we need to change your treatment, we will call you to review the results.  Follow-Up: At [facility], you and your health needs are our priority.  As part of our continuing mission to provide you with exceptional heart care, we have created designated Provider Care Teams.  These Care Teams include your primary Cardiologist (physician) and Advanced Practice Providers (APPs -  Physician Assistants and Nurse Practitioners) who all work together to provide you with the care you need, when you need it.  We recommend signing up for the patient portal called "MyChart".  Sign up  information is provided on this After Visit Summary.  MyChart is used to connect with patients for Virtual Visits (Telemedicine).  Patients are able to view lab/test results, encounter notes, upcoming appointments, etc.  Non-urgent messages can be sent to your provider as well.   To learn more about what you can do with MyChart, go to [email or web address].    Your next appointment:   3 month(s)  The format for your next appointment:   In Person  Provider:   Oswaldo Milian, MD   Other Instructions You have been referred to: Gastroenterology --they will contact you for an appointment      Ella Bodo as a scribe for Donato Heinz, MD.,have documented all relevant documentation on the behalf of Donato Heinz, MD,as directed by  Donato Heinz, MD while in the presence of Donato Heinz, MD.  I, Donato Heinz, MD, have reviewed all documentation for this visit. The documentation on 03/13/21 for the exam, diagnosis, procedures, and orders are all accurate and complete.   Signed, Donato Heinz, MD  03/13/2021 9:57 PM    [place]

## 2021-03-13 NOTE — Patient Instructions (Signed)
Medication Instructions:  Your physician recommends that you continue on your current medications as directed. Please refer to the Current Medication list given to you today.  *If you need a refill on your cardiac medications before your next appointment, please call your pharmacy*   Lab Work: CMET, CBC, TSH, Iron panel today  If you have labs (blood work) drawn today and your tests are completely normal, you will receive your results only by: Marland Kitchen MyChart Message (if you have MyChart) OR . A paper copy in the mail If you have any lab test that is abnormal or we need to change your treatment, we will call you to review the results.  Follow-Up: At [facility], you and your health needs are our priority.  As part of our continuing mission to provide you with exceptional heart care, we have created designated Provider Care Teams.  These Care Teams include your primary Cardiologist (physician) and Advanced Practice Providers (APPs -  Physician Assistants and Nurse Practitioners) who all work together to provide you with the care you need, when you need it.  We recommend signing up for the patient portal called "MyChart".  Sign up information is provided on this After Visit Summary.  MyChart is used to connect with patients for Virtual Visits (Telemedicine).  Patients are able to view lab/test results, encounter notes, upcoming appointments, etc.  Non-urgent messages can be sent to your provider as well.   To learn more about what you can do with MyChart, go to [email or web address].    Your next appointment:   3 month(s)  The format for your next appointment:   In Person  Provider:   Oswaldo Milian, MD   Other Instructions You have been referred to: Gastroenterology --they will contact you for an appointment

## 2021-03-14 ENCOUNTER — Other Ambulatory Visit: Payer: Self-pay

## 2021-03-14 DIAGNOSIS — I5042 Chronic combined systolic (congestive) and diastolic (congestive) heart failure: Secondary | ICD-10-CM

## 2021-03-14 MED ORDER — SPIRONOLACTONE 25 MG PO TABS: 12.5000 mg | ORAL_TABLET | Freq: Every day | ORAL | 1 refills | Status: DC

## 2021-03-20 ENCOUNTER — Emergency Department (HOSPITAL_COMMUNITY)
Admission: EM | Admit: 2021-03-20 | Discharge: 2021-03-20 | Disposition: A | Discharge status: Skilled Nursing Facility | Payer: Medicare Other | Attending: Emergency Medicine | Admitting: Emergency Medicine

## 2021-03-20 ENCOUNTER — Telehealth: Payer: Self-pay | Admitting: Cardiology

## 2021-03-20 ENCOUNTER — Emergency Department (HOSPITAL_COMMUNITY): Payer: Medicare Other

## 2021-03-20 DIAGNOSIS — W19XXXA Unspecified fall, initial encounter: Secondary | ICD-10-CM

## 2021-03-20 DIAGNOSIS — Z87891 Personal history of nicotine dependence: Secondary | ICD-10-CM | POA: Insufficient documentation

## 2021-03-20 DIAGNOSIS — K5730 Diverticulosis of large intestine without perforation or abscess without bleeding: Secondary | ICD-10-CM | POA: Diagnosis not present

## 2021-03-20 DIAGNOSIS — S0990XA Unspecified injury of head, initial encounter: Secondary | ICD-10-CM | POA: Diagnosis present

## 2021-03-20 DIAGNOSIS — W06XXXA Fall from bed, initial encounter: Secondary | ICD-10-CM | POA: Insufficient documentation

## 2021-03-20 DIAGNOSIS — S0001XA Abrasion of scalp, initial encounter: Secondary | ICD-10-CM | POA: Diagnosis not present

## 2021-03-20 DIAGNOSIS — F0390 Unspecified dementia without behavioral disturbance: Secondary | ICD-10-CM | POA: Diagnosis not present

## 2021-03-20 DIAGNOSIS — I119 Hypertensive heart disease without heart failure: Secondary | ICD-10-CM | POA: Insufficient documentation

## 2021-03-20 DIAGNOSIS — E039 Hypothyroidism, unspecified: Secondary | ICD-10-CM | POA: Insufficient documentation

## 2021-03-20 DIAGNOSIS — I2510 Atherosclerotic heart disease of native coronary artery without angina pectoris: Secondary | ICD-10-CM | POA: Insufficient documentation

## 2021-03-20 DIAGNOSIS — M19012 Primary osteoarthritis, left shoulder: Secondary | ICD-10-CM | POA: Diagnosis not present

## 2021-03-20 DIAGNOSIS — M879 Osteonecrosis, unspecified: Secondary | ICD-10-CM | POA: Diagnosis not present

## 2021-03-20 DIAGNOSIS — J324 Chronic pansinusitis: Secondary | ICD-10-CM | POA: Insufficient documentation

## 2021-03-20 DIAGNOSIS — M19011 Primary osteoarthritis, right shoulder: Secondary | ICD-10-CM | POA: Diagnosis not present

## 2021-03-20 LAB — I-STAT CHEM 8, ED
BUN: 17 mg/dL (ref 8–23)
Calcium, Ion: 1 mmol/L — ABNORMAL LOW (ref 1.15–1.40)
Chloride: 106 mmol/L (ref 98–111)
Creatinine, Ser: 1.6 mg/dL — ABNORMAL HIGH (ref 0.61–1.24)
Glucose, Bld: 79 mg/dL (ref 70–99)
HCT: 33 % — ABNORMAL LOW (ref 39.0–52.0)
Hemoglobin: 11.2 g/dL — ABNORMAL LOW (ref 13.0–17.0)
Potassium: 4.3 mmol/L (ref 3.5–5.1)
Sodium: 138 mmol/L (ref 135–145)
TCO2: 24 mmol/L (ref 22–32)

## 2021-03-20 NOTE — ED Notes (Signed)
Registration arrived

## 2021-03-20 NOTE — ED Notes (Signed)
Radiology arrived

## 2021-03-20 NOTE — ED Notes (Signed)
I notified pt's daughter Page that pt is being D/C'd and she stated she will come pick him up.

## 2021-03-20 NOTE — ED Notes (Signed)
Updated pts daughter Page on pt. Daughter stated she would like to pick pt if he is up for discharge, daughter would bring pt back to his facility.

## 2021-03-20 NOTE — ED Provider Notes (Signed)
Emergency Department Provider Note   I have reviewed the triage vital signs and the nursing notes.   HISTORY  Chief Complaint Fall on Blood thinners   HPI Ronnie Joseph is a 87 y.o. male on anticoagulation presents to the ED from his nursing facility after a fall from bed this AM.  He arrives to the emergency department as a level 2 trauma per protocol due to anticoagulation.  Staff reported to EMS that he has had several falls recently.  He rolled out of bed but EMS report that his bed was relatively high off the ground.  He has bruising in several locations as well as an abrasion to the head with no active bleeding.  Patient denies any active pain.   Level 5 caveat: Dementia   Past Medical History:  Diagnosis Date   Acute anemia 09/26/2020   Anemia due to acute blood loss 05/25/2019   Chronic maxillary sinusitis 08/06/2017   Coronary artery disease due to lipid rich plaque 07/27/2019   Essential hypertension 02/14/2003   Fainting 05/30/2019   Fall 05/25/1999   High cholesterol 03/14/2001   History of basal cell carcinoma 11/02/2019   History of pneumonia    History of snoring    History of upper respiratory infection    Irregular heartbeat 08/06/2003   Ischemic cardiomyopathy 05/30/2019   Mild cognitive impairment 05/15/2019   NSTEMI (non-ST elevated myocardial infarction) ([place]) 06/03/2019   Poor renal function 03/19/2005   Primary osteoarthritis of shoulder 10/24/2015   Psoriasis 12/29/2004   Rash and nonspecific skin eruption 04/30/2020   Schatzki's ring 08/07/2020   Sepsis with acute hypoxic respiratory failure ([place]) 09/26/2020   Sleep apnea 08/13/2003    Patient Active Problem List   Diagnosis Date Noted   Acquired hypothyroidism 11/17/2020   Low hemoglobin 11/17/2020   Primary hypertension 11/17/2020   Coronary artery disease due to lipid rich plaque 11/17/2020   Mild cognitive impairment 11/17/2020   Basal cell carcinoma (BCC) of scalp 11/17/2020   Iron  deficiency anemia due to chronic blood loss 11/17/2020   Unsteady gait 11/17/2020   Atrial fibrillation ([place]) 11/17/2020   Hypertensive heart disease with heart failure ([place]) 11/17/2020   Insomnia due to medical condition 11/17/2020   Benign prostatic hyperplasia with nocturia 11/17/2020   Intrinsic eczema 11/17/2020    Allergies Lisinopril, Telmisartan, Atorvastatin, and Sulfa antibiotics  Family History  Problem Relation Age of Onset   Lung disease Mother    Heart attack Father    Brain cancer Brother    Breast cancer Daughter    Celiac disease Daughter    Diabetes Mellitus I Son     Social History Social History   Tobacco Use   Smoking status: Former    Packs/day: 0.25    Pack years: 0.00    Types: Cigarettes    Quit date: 12/17/1958    Years since quitting: 62.2   Smokeless tobacco: Never  Vaping Use   Vaping Use: Never used  Substance Use Topics   Alcohol use: Yes    Alcohol/week: 3.0 standard drinks    Types: 3 Standard drinks or equivalent per week   Drug use: Never    Review of Systems  Constitutional: No fever/chills Cardiovascular: Denies chest pain. Respiratory: Denies shortness of breath. Gastrointestinal: No abdominal pain.   Musculoskeletal: Negative for back pain. Skin: Negative for rash. Neurological: Negative for headaches  Level 5 caveat: Dementia   ____________________________________________   PHYSICAL EXAM:  VITAL SIGNS: ED Triage Vitals  Enc  Vitals Group     BP 03/20/21 0527 (!) 126/52     Pulse Rate 03/20/21 0527 (!) 54     Resp 03/20/21 0527 16     Temp 03/20/21 0527 97.8 F (36.6 C)     Temp Source 03/20/21 0527 Oral     SpO2 03/20/21 0527 99 %     Weight 03/20/21 0528 165 lb (74.8 kg)     Height 03/20/21 0528 5\' 8"  (1.727 m)   Constitutional: Alert with mild confusion. Well appearing and in no acute distress. Eyes: Conjunctivae are normal.  Head: Abrasion to the right parietal scalp. Bruising behind the left ear.   Nose: No congestion/rhinnorhea. Mouth/Throat: Mucous membranes are moist. Neck: No stridor.   Cardiovascular: Bradycardia. Good peripheral circulation. Grossly normal heart sounds.   Respiratory: Normal respiratory effort.  No retractions. Lungs CTAB. Gastrointestinal: Soft and nontender. No distention.  Musculoskeletal: No lower extremity tenderness nor edema. No gross deformities of extremities. Normal active and passive ROM to the bilateral upper and lower extremities.  Neurologic:  Normal speech and language. No gross focal neurologic deficits are appreciated.  Skin:  Skin is warm and dry. Large area of bruising to the lumbar spine region. No laceration. No midline tenderness.    ____________________________________________   LABS (all labs ordered are listed, but only abnormal results are displayed)  Labs Reviewed  I-STAT CHEM 8, ED - Abnormal; Notable for the following components:      Result Value   Creatinine, Ser 1.60 (*)    Calcium, Ion 1.00 (*)    Hemoglobin 11.2 (*)    HCT 33.0 (*)    All other components within normal limits   ____________________________________________  RADIOLOGY  DG Pelvis Portable  Result Date: 03/20/2021 CLINICAL DATA:  Status post fall. EXAM: PORTABLE PELVIS 1-2 VIEWS COMPARISON:  None. FINDINGS: There is no evidence of pelvic fracture or diastasis. Slight cortical irregularity along the left femoral neck possibly due to overlying vascular calcifications. No acute displaced fracture or dislocation of bilateral hips. No pelvic bone lesions are seen. Vascular calcifications. IMPRESSION: No definite acute displaced fracture, pelvic diastasis, hip dislocation. Electronically Signed   By: Iven Finn M.D.   On: 03/20/2021 05:49   DG Chest Portable 1 View  Result Date: 03/20/2021 CLINICAL DATA:  Status post fall EXAM: PORTABLE CHEST 1 VIEW COMPARISON:  None. FINDINGS: The heart size and mediastinal contours are unchanged. Aortic arch calcification.  Coronary artery stents. No focal consolidation. No pulmonary edema. No pleural effusion. No pneumothorax. Old healed right fourth rib fracture. Likely acute minimally displaced left lateral eighth rib fracture. Question nondisplaced left lateral tenth rib fracture. Bilateral shoulder degenerative changes. IMPRESSION: Acute minimally displaced left lateral eighth rib fracture. No radiographic finding of pneumothorax. Electronically Signed   By: Iven Finn M.D.   On: 03/20/2021 05:47    ____________________________________________   PROCEDURES  Procedure(s) performed:   Procedures  None  ____________________________________________   INITIAL IMPRESSION / ASSESSMENT AND PLAN / ED COURSE  Pertinent labs & imaging results that were available during my care of the patient were reviewed by me and considered in my medical decision making (see chart for details).   Patient arrives to the emergency department as a level 2 trauma after fall on blood thinners.  Patient is awake and alert but confused per his baseline.  He is from a nursing facility.  He has an abrasion to the scalp but no laceration requiring suture or other repair other than basic wound care.  On exam patient does have bruising to several areas including behind the left ear and lower back.  Plan for CT imaging of the head, cervical spine, chest, abdomen, pelvis in the setting.  With report of frequent falls recently have obtained blood work and will monitor here pending imaging.   CXR with 8th rib fx. No PNX. Will follow CT imaging.   CT head , c-spine, chest, abdomen, and pelvis reviewed. Chronic rib fractures. No focal tenderness on exam. iSTAT labs WNL. Plan for discharge back to facility with basic wound care instructions. Defer decision to remain on anticoagulation with frequent fall history to PCP.  ____________________________________________  FINAL CLINICAL IMPRESSION(S) / ED DIAGNOSES  Final diagnoses:  Fall, initial  encounter  Injury of head, initial encounter  Abrasion of scalp, initial encounter    Note:  This document was prepared using Dragon voice recognition software and may include unintentional dictation errors.  Nanda Quinton, MD, [facility] Emergency Medicine    Azlee Monforte, [facility], MD 03/20/21 1700

## 2021-03-20 NOTE — Telephone Encounter (Signed)
Order signed and faxed to [phone]

## 2021-03-20 NOTE — ED Notes (Signed)
Chaplain arrived

## 2021-03-20 NOTE — ED Notes (Signed)
Patient transported to CT 

## 2021-03-20 NOTE — ED Notes (Addendum)
Trauma Response Nurse Note-  Reason for Call / Reason for Trauma activation:   - Level two fall on anticoagulants, head injury  Initial Focused Assessment (If applicable, or please see trauma documentation):  - abrasions noted to anterior head, bruising to bilateral flanks  Interventions:  - XRAY, CT, IV insertion  Plan of Care as of this note:  - pending CT results  Event Summary:   - Pt arrives from [facility] after a fall from his bed, per facility patient has been falling more frequently lately. Pt somewhat poor historian, no medical hx available in chart at this time. Abrasions x2 noted to head, bruising to bilateral flanks/back. Eighth rib fx noted on chest XRAY, CT Imaging pending at this time.  The Following (if applicable):    -MD notified: Dr. Laverta [place], [place]    -Time of Page/Time of notification: 72    -TRN arrival Time: 50    -End time: 0600

## 2021-03-20 NOTE — Progress Notes (Signed)
Chaplain responded to this level II fall.  Patient is a resident at [facility] and moved their a month ago.  Patient has a daughter who lives locally and patient understands facility will call her.  Patient heading for CT scan.  Chaplain available as needed for patient/family support. [place], Mdiv.    03/20/21 0141  Clinical Encounter Type  Visited With Patient;Health care provider  Visit Type Trauma  Referral From Nurse  Consult/Referral To Chaplain

## 2021-03-20 NOTE — Discharge Instructions (Addendum)
You were seen in the Emergency Department (ED) today for a head injury.  Your CT scans showed no bleeding or new fracture.   Symptoms to expect from a concussion include nausea, mild to moderate headache, difficulty concentrating or sleeping, and mild lightheadedness.  These symptoms should improve over the next few days to weeks, but it may take many weeks before you feel back to normal.  Return to the emergency department or follow-up with your primary care doctor if your symptoms are not improving over this time.  Signs of a more serious head injury include vomiting, severe headache, excessive sleepiness or confusion, and weakness or numbness in your face, arms or legs.  Return immediately to the Emergency Department if you experience any of these more concerning symptoms.    Rest, avoid strenuous physical or mental activity, and avoid activities that could potentially result in another head injury until all your symptoms from this head injury are completely resolved for at least 2-3 weeks. You may take acetaminophen over the counter according to label instructions for mild headache or scalp soreness.

## 2021-03-20 NOTE — ED Notes (Signed)
Ortho tech arrived

## 2021-03-20 NOTE — Telephone Encounter (Signed)
They need the order sign for this prescription spironolactone (ALDACTONE) 25 MG tablet. Please advise. Can be fax [phone]. Patient is already taking the medication just need the ordered to be sign by DR. Gardiner Rhyme

## 2021-03-20 NOTE — Progress Notes (Signed)
Orthopedic Tech Progress Note Patient Details:  Ronnie Joseph 04-05-1932 [id number]  Level 2 trauma Patient ID: Ronnie Joseph, male   DOB: 27-Jul-1932, 87 y.o.   MRN: [id number]  Ronnie Joseph 03/20/2021, 5:35 AM

## 2021-03-20 NOTE — ED Triage Notes (Signed)
Pt arrived via ems due to a fall on thinners. Pt is from [facility] and fell out of his bed going to the bathroom. Pt is on Eliquis and Plavix. Pt denies any pain anywhere. Pt has a laceration to the right side of his head. EMS denies loc. EMS reports the facility stated that the pt has been having a lot of falls recently. Pt has old bruising on his back.

## 2021-03-21 ENCOUNTER — Encounter: Payer: Self-pay | Admitting: Nurse Practitioner

## 2021-03-24 LAB — BASIC METABOLIC PANEL
BUN/Creatinine Ratio: 12 (ref 10–24)
BUN: 21 mg/dL (ref 8–27)
CO2: 23 mmol/L (ref 20–29)
Calcium: 9.5 mg/dL (ref 8.6–10.2)
Chloride: 102 mmol/L (ref 96–106)
Creatinine, Ser: 1.71 mg/dL — ABNORMAL HIGH (ref 0.76–1.27)
Glucose: 86 mg/dL (ref 65–99)
Potassium: 5.1 mmol/L (ref 3.5–5.2)
Sodium: 141 mmol/L (ref 134–144)
eGFR: 38 mL/min/{1.73_m2} — ABNORMAL LOW (ref >59)

## 2021-03-25 ENCOUNTER — Other Ambulatory Visit: Payer: Self-pay | Admitting: *Deleted

## 2021-03-25 DIAGNOSIS — I10 Essential (primary) hypertension: Secondary | ICD-10-CM

## 2021-03-25 DIAGNOSIS — I5042 Chronic combined systolic (congestive) and diastolic (congestive) heart failure: Secondary | ICD-10-CM

## 2021-03-27 ENCOUNTER — Encounter: Payer: Self-pay | Admitting: Family

## 2021-03-27 ENCOUNTER — Other Ambulatory Visit: Payer: Self-pay

## 2021-03-27 ENCOUNTER — Ambulatory Visit (INDEPENDENT_AMBULATORY_CARE_PROVIDER_SITE_OTHER): Payer: Medicare Other | Admitting: Family

## 2021-03-27 VITALS — BP 110/60 | HR 64 | Temp 97.5°F | Resp 16 | Ht 68.0 in | Wt 170.8 lb

## 2021-03-27 DIAGNOSIS — H6123 Impacted cerumen, bilateral: Secondary | ICD-10-CM

## 2021-03-27 DIAGNOSIS — K5901 Slow transit constipation: Secondary | ICD-10-CM | POA: Diagnosis not present

## 2021-03-27 DIAGNOSIS — I2510 Atherosclerotic heart disease of native coronary artery without angina pectoris: Secondary | ICD-10-CM | POA: Diagnosis not present

## 2021-03-27 DIAGNOSIS — W19XXXD Unspecified fall, subsequent encounter: Secondary | ICD-10-CM | POA: Diagnosis not present

## 2021-03-27 DIAGNOSIS — Y92009 Unspecified place in unspecified non-institutional (private) residence as the place of occurrence of the external cause: Secondary | ICD-10-CM

## 2021-03-27 DIAGNOSIS — D649 Anemia, unspecified: Secondary | ICD-10-CM

## 2021-03-27 LAB — CBC WITH DIFFERENTIAL/PLATELET
Absolute Monocytes: 1004 cells/uL — ABNORMAL HIGH (ref 200–950)
Basophils Absolute: 83 cells/uL (ref 0–200)
Basophils Relative: 1 %
Eosinophils Absolute: 423 cells/uL (ref 15–500)
Eosinophils Relative: 5.1 %
HCT: 34.4 % — ABNORMAL LOW (ref 38.5–50.0)
Hemoglobin: 11.1 g/dL — ABNORMAL LOW (ref 13.2–17.1)
Lymphs Abs: 1079 cells/uL (ref 850–3900)
MCH: 33.8 pg — ABNORMAL HIGH (ref 27.0–33.0)
MCHC: 32.3 g/dL (ref 32.0–36.0)
MCV: 104.9 fL — ABNORMAL HIGH (ref 80.0–100.0)
MPV: 10.4 fL (ref 7.5–12.5)
Monocytes Relative: 12.1 %
Neutro Abs: 5710 cells/uL (ref 1500–7800)
Neutrophils Relative %: 68.8 %
Platelets: 298 10*3/uL (ref 140–400)
RBC: 3.28 10*6/uL — ABNORMAL LOW (ref 4.20–5.80)
RDW: 12.9 % (ref 11.0–15.0)
Total Lymphocyte: 13 %
WBC: 8.3 10*3/uL (ref 3.8–10.8)

## 2021-03-27 MED ORDER — DEBROX 6.5 % OT SOLN: 5.0000 [drp] | Freq: Two times a day (BID) | OTIC | 0 refills | Status: DC

## 2021-03-27 NOTE — Patient Instructions (Signed)

## 2021-03-27 NOTE — Progress Notes (Signed)
Provider: Teagen Mcleary FNP-C  [name], NP  Patient Care Team: [name], NP as PCP - General (Adult Health Nurse Practitioner) Vickie Epley, MD as PCP - Electrophysiology (Cardiology) Jari Pigg, MD as Consulting Physician (Dermatology) Donato Heinz, MD as Consulting Physician (Cardiology) [name], NP (Adult Health Nurse Practitioner)  Extended Emergency Contact Information Primary Emergency Contact: Kreager,Page Address: [place], [place] [address] Johnnette [place]itter of [place] Phone: [phone] Mobile Phone: [phone] Relation: Daughter Preferred language: English  Code Status:  Full Code  Goals of care: Advanced Directive information Advanced Directives 03/27/2021  Does Patient Have a Medical Advance Directive? Yes  Type of Advance Directive [place]  Does patient want to make changes to medical advance directive? No - Patient declined  Copy of [place] in Chart? Yes - validated most recent copy scanned in chart (See row information)     Chief Complaint  Patient presents with   Hospitalization Follow-up    ED Follow up for fall.    HPI:  Pt is a 87 y.o. male seen today for an acute visit for evaluation of ED follow up for fall.He was seen in ED 03/20/2021 He states fell off the bed in his facility.He is here with daughter.He had several falls prior.He sustain bruising in several location as well as abrasion to the head but no laceration requiring suture.No bleeding noted.Pelvic x-ray showed no acute fracture or dislocation. CXR showed minimal displaced left lateral 8th rib fracture but no pneumonia.rib fracture determined to be chronic.Also had CT head,C-spine ,chest ,Abdomen which was normal.He is on Apixaban.Labs were unremarkable.Hgb 11.2 previous was 11.9  He states feeling much better.  Has been working with Physical therapy 2 times per week . Has had issues with  constipation due to ferrous sulfate.    Past Medical History:  Diagnosis Date   Acute anemia 09/26/2020   Anemia due to acute blood loss 05/25/2019   Chronic maxillary sinusitis 08/06/2017   Coronary artery disease due to lipid rich plaque 07/27/2019   Essential hypertension 02/14/2003   Fainting 05/30/2019   Fall 05/25/1999   High cholesterol 03/14/2001   History of basal cell carcinoma 11/02/2019   History of pneumonia    History of snoring    History of upper respiratory infection    Irregular heartbeat 08/06/2003   Ischemic cardiomyopathy 05/30/2019   Mild cognitive impairment 05/15/2019   NSTEMI (non-ST elevated myocardial infarction) ([facility]) 06/03/2019   Poor renal function 03/19/2005   Primary osteoarthritis of shoulder 10/24/2015   Psoriasis 12/29/2004   Rash and nonspecific skin eruption 04/30/2020   Schatzki's ring 08/07/2020   Sepsis with acute hypoxic respiratory failure ([facility]) 09/26/2020   Sleep apnea 08/13/2003   Past Surgical History:  Procedure Laterality Date   APPENDECTOMY     CATARACT EXTRACTION     COLONOSCOPY     HERNIA REPAIR     RIGHT HEART CATHETERIZATION WITH ADENOSINE STUDY     UPPER GI ENDOSCOPY      Allergies  Allergen Reactions   Lisinopril Swelling   Telmisartan Hives   Atorvastatin Rash   Sulfa Antibiotics Rash    Outpatient Encounter Medications as of 03/27/2021  Medication Sig   acetaminophen (TYLENOL) 500 MG tablet Take 500 mg by mouth 2 (two) times daily.   albuterol (PROVENTIL) (2.5 MG/3ML) 0.083% nebulizer solution Take 2.5 mg by nebulization every 6 (six) hours as  needed for wheezing or shortness of breath.   apixaban (ELIQUIS) 5 MG TABS tablet Take 5 mg by mouth 2 (two) times daily.   aspirin EC 81 MG tablet Take 1 tablet (81 mg total) by mouth daily. Swallow whole.   benzonatate (TESSALON) 100 MG capsule Take 100 mg by mouth 3 (three) times daily as needed for cough.   CALCIUM CITRATE PO Take 1 tablet by mouth 2 (two) times  daily.   empagliflozin (JARDIANCE) 10 MG TABS tablet Take 1 tablet (10 mg total) by mouth daily before breakfast.   ferrous sulfate 325 (65 FE) MG tablet Take 325 mg by mouth daily.   fluticasone (FLONASE) 50 MCG/ACT nasal spray Place 2 sprays into both nostrils 2 (two) times daily.   fluticasone furoate-vilanterol (BREO ELLIPTA) 100-25 MCG/INH AEPB Inhale 1 puff into the lungs daily.   furosemide (LASIX) 20 MG tablet Take 1 tablet (20 mg total) by mouth as needed (weight increase of 3 lbs in 1 day or 5 lbs in 1 week).   halobetasol (ULTRAVATE) 0.05 % cream Apply 1 application topically 3 (three) times a week. Spread topically to arms, thighs, and legs. Monday, Wednesday, and Friday.   halobetasol (ULTRAVATE) 0.05 % cream Apply 1 application topically every other day. Spread topically to back and belt line.   ketoconazole (NIZORAL) 2 % cream Apply topically daily.   levocetirizine (XYZAL) 5 MG tablet Take 5 mg by mouth daily.   levothyroxine (SYNTHROID) 88 MCG tablet Take 1 tablet (88 mcg total) by mouth daily.   losartan (COZAAR) 50 MG tablet Take 50 mg by mouth in the morning.   Magnesium Hydroxide (DULCOLAX PO) Take by mouth as needed.   metoprolol succinate (TOPROL-XL) 50 MG 24 hr tablet Take 50 mg by mouth daily. Take with or immediately following a meal.   montelukast (SINGULAIR) 10 MG tablet Take 10 mg by mouth daily.   Multiple Vitamin (MULTIVITAMIN PO) Take 1 tablet by mouth daily.   pantoprazole (PROTONIX) 40 MG tablet Take 40 mg by mouth daily.   polyethylene glycol (MIRALAX / GLYCOLAX) 17 g packet Take 17 g by mouth daily as needed.   rosuvastatin (CRESTOR) 20 MG tablet Take 20 mg by mouth daily.   Saw Palmetto, Serenoa repens, (SAW PALMETTO FRUIT PO) Take 450 mg by mouth 2 (two) times daily.   senna-docusate (SENOKOT-S) 8.6-50 MG tablet Take 1 tablet by mouth 2 (two) times daily.   spironolactone (ALDACTONE) 25 MG tablet Take 0.5 tablets (12.5 mg total) by mouth daily.    triamcinolone cream (KENALOG) 0.1 % Apply 1 application topically as needed.   [DISCONTINUED] amiodarone (PACERONE) 200 MG tablet Take 200 mg by mouth daily.   [DISCONTINUED] cyproheptadine (PERIACTIN) 4 MG tablet Take 4 mg by mouth in the morning, at noon, and at bedtime.   [DISCONTINUED] Docusate Sodium (DSS) 100 MG CAPS Take 1 tablet by mouth as needed.   No facility-administered encounter medications on file as of 03/27/2021.    Review of Systems  Constitutional:  Negative for appetite change, chills, fatigue, fever and unexpected weight change.  HENT:  Negative for congestion, dental problem, ear discharge, ear pain, facial swelling, hearing loss, nosebleeds, postnasal drip, rhinorrhea, sinus pressure, sinus pain, sneezing, sore throat, tinnitus and trouble swallowing.   Eyes:  Negative for pain, discharge, redness, itching and visual disturbance.  Respiratory:  Negative for cough, chest tightness, shortness of breath and wheezing.   Cardiovascular:  Negative for chest pain, palpitations and leg swelling.  Gastrointestinal:  Negative for abdominal distention, abdominal pain, blood in stool, constipation, diarrhea, nausea and vomiting.  Endocrine: Negative for cold intolerance, heat intolerance, polydipsia, polyphagia and polyuria.  Genitourinary:  Negative for difficulty urinating, dysuria, flank pain, frequency and urgency.  Musculoskeletal:  Positive for gait problem. Negative for arthralgias, back pain, joint swelling, myalgias, neck pain and neck stiffness.  Skin:  Negative for color change, pallor, rash and wound.  Neurological:  Negative for dizziness, syncope, speech difficulty, weakness, light-headedness, numbness and headaches.  Hematological:  Does not bruise/bleed easily.  Psychiatric/Behavioral:  Negative for agitation, behavioral problems, confusion, hallucinations, self-injury, sleep disturbance and suicidal ideas. The patient is not nervous/anxious.    Immunization History   Administered Date(s) Administered   Fluad Quad(high Dose 65+) 07/06/2017   Influenza Split 09/16/2000, 08/25/2001, 07/10/2002, 07/23/2003, 08/14/2004, 08/12/2005, 08/18/2006   Influenza Whole 07/15/2011   Influenza, High Dose Seasonal PF 08/10/2014, 08/13/2015, 07/16/2016, 06/15/2019, 06/19/2020   Influenza, Seasonal, Injecte, Preservative Fre 08/09/2007, 09/19/2008, 09/10/2009, 08/06/2010, 07/18/2012   Influenza,inj,Quad PF,6+ Mos 07/14/2013, 07/01/2018   Influenza,trivalent, recombinat, inj, PF 08/09/2007, 09/19/2008, 09/10/2009, 08/06/2010, 07/18/2012   Influenza-Unspecified 09/16/2000, 08/25/2001, 07/10/2002, 07/23/2003, 08/14/2004, 08/12/2005, 08/18/2006, 07/14/2013, 07/01/2018   Moderna Sars-Covid-2 Vaccination 10/25/2019, 11/22/2019, 08/23/2020   Pneumococcal Conjugate-13 10/12/1998, 08/06/2010, 08/10/2014   Pneumococcal Polysaccharide-23 08/06/2010, 05/15/2016   Pneumococcal-Unspecified 10/12/1998, 08/06/2010   Zoster, Live 08/26/2012   Pertinent  Health Maintenance Due  Topic Date Due   INFLUENZA VACCINE  05/12/2021   PNA vac Low Risk Adult  Completed   Fall Risk  03/27/2021 12/10/2020  Falls in the past year? 1 0  Number falls in past yr: 1 0  Injury with Fall? 1 0  Risk for fall due to : History of fall(s) -   Functional Status Survey:    Vitals:   03/27/21 0927  BP: 110/60  Pulse: 64  Resp: 16  Temp: (!) 97.5 F (36.4 C)  SpO2: 95%  Weight: 170 lb 12.8 oz (77.5 kg)  Height: 5\' 8"  (1.727 m)   Body mass index is 25.97 kg/m. Physical Exam Vitals reviewed.  Constitutional:      General: He is not in acute distress.    Appearance: Normal appearance. He is overweight. He is not ill-appearing or diaphoretic.  HENT:     Head: Normocephalic.     Right Ear: There is impacted cerumen.     Left Ear: There is impacted cerumen.     Nose: Nose normal. No congestion or rhinorrhea.     Mouth/Throat:     Mouth: Mucous membranes are moist.     Pharynx: Oropharynx is  clear. No oropharyngeal exudate or posterior oropharyngeal erythema.  Eyes:     General: No scleral icterus.       Right eye: No discharge.        Left eye: No discharge.     Extraocular Movements: Extraocular movements intact.     Conjunctiva/sclera: Conjunctivae normal.     Pupils: Pupils are equal, round, and reactive to light.  Neck:     Vascular: No carotid bruit.  Cardiovascular:     Rate and Rhythm: Normal rate and regular rhythm.     Pulses: Normal pulses.     Heart sounds: Normal heart sounds. No murmur heard.   No friction rub. No gallop.  Pulmonary:     Effort: Pulmonary effort is normal. No respiratory distress.     Breath sounds: Normal breath sounds. No wheezing, rhonchi or rales.  Chest:     Chest wall: No tenderness.  Abdominal:     General: Bowel sounds are normal. There is no distension.     Palpations: Abdomen is soft. There is no mass.     Tenderness: There is no abdominal tenderness. There is no right CVA tenderness, left CVA tenderness, guarding or rebound.  Musculoskeletal:        General: No swelling or tenderness.     Cervical back: Normal range of motion. No rigidity or tenderness.     Right lower leg: No edema.     Left lower leg: No edema.     Comments: Unsteady gait   Lymphadenopathy:     Cervical: No cervical adenopathy.  Skin:    General: Skin is warm and dry.     Coloration: Skin is not pale.     Findings: No bruising, erythema, lesion or rash.     Comments: Scab on left side of the head progressive healing. Purple bruise on left ear and lower back   Neurological:     Mental Status: He is alert and oriented to person, place, and time.     Cranial Nerves: No cranial nerve deficit.     Sensory: No sensory deficit.     Motor: No weakness.     Coordination: Coordination normal.     Gait: Gait abnormal.  Psychiatric:        Mood and Affect: Mood normal.        Speech: Speech normal.        Behavior: Behavior normal.        Thought Content:  Thought content normal.        Judgment: Judgment normal.    Labs reviewed: Recent Labs    01/10/21 0946 01/17/21 1444 03/13/21 1118 03/20/21 0559 03/24/21 1108  NA 142 142 143 138 141  K 5.4* 5.0 4.8 4.3 5.1  CL 102 104 102 106 102  CO2 23 22 24   --  23  GLUCOSE 124* 80 124* 79 86  BUN 22 21 18 17 21   CREATININE 1.62* 1.32* 1.58* 1.60* 1.71*  CALCIUM 10.0 9.5 9.7  --  9.5  MG 2.3  --   --   --   --    Recent Labs    11/12/20 1629 11/26/20 1550 03/13/21 1118  AST 16 22 16   ALT 13 17 11   ALKPHOS  --  44 47  BILITOT 0.4 0.2 0.3  PROT 6.8 6.7 6.8  ALBUMIN  --  4.4 4.3   Recent Labs    11/12/20 1629 11/26/20 1550 12/10/20 0000 03/13/21 1118 03/20/21 0559  WBC 10.7 8.7 8.0 8.3  --   NEUTROABS 6,762  --  4,992  --   --   HGB 10.9* 11.0* 11.1* 11.9* 11.2*  HCT 33.1* 34.4* 33.4* 35.8* 33.0*  MCV 98.5 101* 101.8* 104*  --   PLT 325 348 332 312  --    Lab Results  Component Value Date   TSH 1.880 03/13/2021   No results found for: HGBA1C Lab Results  Component Value Date   CHOL 147 12/10/2020   HDL 51 12/10/2020   LDLCALC 71 12/10/2020   TRIG 176 (H) 12/10/2020   CHOLHDL 2.9 12/10/2020    Significant Diagnostic Results in last 30 days:  CT ABDOMEN PELVIS WO CONTRAST  Result Date: 03/20/2021 CLINICAL DATA:  Status post fall EXAM: CT HEAD WITHOUT CONTRAST CT CERVICAL SPINE WITHOUT CONTRAST CT CHEST, ABDOMEN AND PELVIS WITHOUT CONTRAST TECHNIQUE: Contiguous axial images were obtained from the base of the skull  through the vertex without intravenous contrast. Multidetector CT imaging of the cervical spine was performed without intravenous contrast. Multiplanar CT image reconstructions were also generated. Multidetector CT imaging of the chest, abdomen and pelvis was performed following the standard protocol without IV contrast. COMPARISON:  None. FINDINGS: CT HEAD FINDINGS Brain: Cerebral ventricle sizes are concordant with the degree of cerebral volume loss. Patchy  and confluent areas of decreased attenuation are noted throughout the deep and periventricular white matter of the cerebral hemispheres bilaterally, compatible with chronic microvascular ischemic disease. No evidence of large-territorial acute infarction. No parenchymal hemorrhage. No mass lesion. No extra-axial collection. No mass effect or midline shift. No hydrocephalus. Basilar cisterns are patent. Vascular: No hyperdense vessel. Atherosclerotic calcifications are present within the cavernous internal carotid and vertebral arteries. Skull: No acute fracture or focal lesion. Sinuses/Orbits: Complete opacification of the right maxillary, bilateral ethmoid, bilateral frontal, bilateral sphenoid sinuses. Almost complete opacification of the left maxillary sinus. Bilateral mastoid air cells are clear. The orbits are unremarkable. Other: Right parieto-occipital scalp edema and 1.5 cm hematoma. CT CERVICAL FINDINGS Alignment: Grade 1 anterolisthesis of C4 on C5 and C5 on C6 likely due to degenerative changes. Skull base and vertebrae: Multilevel severe degenerative changes of the spine no acute fracture. No aggressive appearing focal osseous lesion or focal pathologic process. Soft tissues and spinal canal: No prevertebral fluid or swelling. No visible canal hematoma. Upper chest: Unremarkable. Other: None. CHEST: Ports and Devices: None. Lungs/airways: No focal consolidation. There is a triangular subpleural 7 x 6 mm left lower nodule (6:85) that likely represents a intrapulmonary lymph node. There is an 8 by 4 mm subpleural left upper lobe nodule. Several other scattered pulmonary micronodules are noted within the lungs. No pulmonary mass. No pulmonary contusion or laceration. No pneumatocele formation. Left base 6 mm arteriovenous malformation. The central airways are patent. Pleura: No pleural effusion. No pneumothorax. No hemothorax. Lymph Nodes: Limited evaluation for hilar lymphadenopathy on this noncontrast  study. No mediastinal or axillary lymphadenopathy. Mediastinum: No pneumomediastinum. The thoracic aorta is normal in caliber. Severe calcified and noncalcified atherosclerotic plaque. Four-vessel coronary artery calcifications. The heart is normal in size. No significant pericardial effusion. The esophagus is unremarkable. The thyroid is unremarkable. Chest Wall / Breasts: No chest wall mass. Musculoskeletal: Age-indeterminate nondisplaced left anterior 5th rib fracture (4:38) and left anterior 6th rib fracture. Old healed left lateral 8th rib fracture. Age-indeterminate nondisplaced left lateral 8th rib fracture. Sternal fracture. No spinal fracture. Severe bilateral shoulder degenerative changes with deformity of the humeral heads and fluid noted within bilateral shoulder bursa. ABDOMEN / PELVIS: Liver: Not enlarged. No focal lesion. Biliary System: The gallbladder is otherwise unremarkable with no radio-opaque gallstones. No biliary ductal dilatation. Pancreas: Normal pancreatic contour. No main pancreatic duct dilatation. Spleen: Not enlarged. No focal lesion. Adrenal Glands: No nodularity bilaterally. Kidneys: Bilateral renal cortical scarring. No hydroureteronephrosis. No nephroureterolithiasis. No contour deforming renal mass. The urinary bladder is unremarkable. Bowel: No small or large bowel wall thickening or dilatation. Third portion of the duodenum diverticula (4:73, 8:63). Scattered colonic diverticulosis. The appendix not definitely identified. Mesentery, Omentum, and Peritoneum: No simple free fluid ascites. No pneumoperitoneum. No mesenteric hematoma identified. No organized fluid collection. Pelvic Organs: Normal. Lymph Nodes: No abdominal, pelvic, inguinal lymphadenopathy. Vasculature: Severe atherosclerotic plaque. No abdominal aorta or iliac aneurysm. Musculoskeletal: No significant soft tissue hematoma. Right femoral head avascular necrosis. No acute pelvic fracture, cortical irregularity noted  on x-ray represents mild degenerative changes. No spinal fracture. Intervertebral disc space  vacuum phenomenon. Grade 1 anterolisthesis of L4 on L5. IMPRESSION: 1. A right parietooccipital 1.5 cm scalp hematoma with no acute intracranial abnormality and no underlying calvarial fracture. 2. No acute displaced fracture or traumatic listhesis of the cervical spine. 3. Age-indeterminate nondisplaced left anterior 5, anterior 6, lateral 9th rib fractures. Chronic healed left lateral eighth rib fracture (is fractures described as acute on chest x-ray; however, is noted to be healed on CT). Correlate with physical exam for an acute component. 4. No acute traumatic injury to the chest, abdomen, or pelvis with limited evaluation on this noncontrast study. 5. No acute fracture or traumatic malalignment of the thoracic or lumbar spine. 6. No acute pelvic fracture or diastasis. No acute fracture or dislocation of the hips. 7. Extensive pansinusitis. Other imaging findings of potential clinical significance: 1. Duodenal and colonic diverticulosis. 2. Right femoral head avascular necrosis. 3. Severe bilateral shoulder degenerative changes. 4. Aortic Atherosclerosis (ICD10-I70.0) - severe including four-vessel coronary artery calcifications. Electronically Signed   By: Iven Finn M.D.   On: 03/20/2021 06:13   CT Head Wo Contrast  Result Date: 03/20/2021 CLINICAL DATA:  Status post fall EXAM: CT HEAD WITHOUT CONTRAST CT CERVICAL SPINE WITHOUT CONTRAST CT CHEST, ABDOMEN AND PELVIS WITHOUT CONTRAST TECHNIQUE: Contiguous axial images were obtained from the base of the skull through the vertex without intravenous contrast. Multidetector CT imaging of the cervical spine was performed without intravenous contrast. Multiplanar CT image reconstructions were also generated. Multidetector CT imaging of the chest, abdomen and pelvis was performed following the standard protocol without IV contrast. COMPARISON:  None. FINDINGS: CT HEAD  FINDINGS Brain: Cerebral ventricle sizes are concordant with the degree of cerebral volume loss. Patchy and confluent areas of decreased attenuation are noted throughout the deep and periventricular white matter of the cerebral hemispheres bilaterally, compatible with chronic microvascular ischemic disease. No evidence of large-territorial acute infarction. No parenchymal hemorrhage. No mass lesion. No extra-axial collection. No mass effect or midline shift. No hydrocephalus. Basilar cisterns are patent. Vascular: No hyperdense vessel. Atherosclerotic calcifications are present within the cavernous internal carotid and vertebral arteries. Skull: No acute fracture or focal lesion. Sinuses/Orbits: Complete opacification of the right maxillary, bilateral ethmoid, bilateral frontal, bilateral sphenoid sinuses. Almost complete opacification of the left maxillary sinus. Bilateral mastoid air cells are clear. The orbits are unremarkable. Other: Right parieto-occipital scalp edema and 1.5 cm hematoma. CT CERVICAL FINDINGS Alignment: Grade 1 anterolisthesis of C4 on C5 and C5 on C6 likely due to degenerative changes. Skull base and vertebrae: Multilevel severe degenerative changes of the spine no acute fracture. No aggressive appearing focal osseous lesion or focal pathologic process. Soft tissues and spinal canal: No prevertebral fluid or swelling. No visible canal hematoma. Upper chest: Unremarkable. Other: None. CHEST: Ports and Devices: None. Lungs/airways: No focal consolidation. There is a triangular subpleural 7 x 6 mm left lower nodule (6:85) that likely represents a intrapulmonary lymph node. There is an 8 by 4 mm subpleural left upper lobe nodule. Several other scattered pulmonary micronodules are noted within the lungs. No pulmonary mass. No pulmonary contusion or laceration. No pneumatocele formation. Left base 6 mm arteriovenous malformation. The central airways are patent. Pleura: No pleural effusion. No  pneumothorax. No hemothorax. Lymph Nodes: Limited evaluation for hilar lymphadenopathy on this noncontrast study. No mediastinal or axillary lymphadenopathy. Mediastinum: No pneumomediastinum. The thoracic aorta is normal in caliber. Severe calcified and noncalcified atherosclerotic plaque. Four-vessel coronary artery calcifications. The heart is normal in size. No significant pericardial effusion. The  esophagus is unremarkable. The thyroid is unremarkable. Chest Wall / Breasts: No chest wall mass. Musculoskeletal: Age-indeterminate nondisplaced left anterior 5th rib fracture (4:38) and left anterior 6th rib fracture. Old healed left lateral 8th rib fracture. Age-indeterminate nondisplaced left lateral 8th rib fracture. Sternal fracture. No spinal fracture. Severe bilateral shoulder degenerative changes with deformity of the humeral heads and fluid noted within bilateral shoulder bursa. ABDOMEN / PELVIS: Liver: Not enlarged. No focal lesion. Biliary System: The gallbladder is otherwise unremarkable with no radio-opaque gallstones. No biliary ductal dilatation. Pancreas: Normal pancreatic contour. No main pancreatic duct dilatation. Spleen: Not enlarged. No focal lesion. Adrenal Glands: No nodularity bilaterally. Kidneys: Bilateral renal cortical scarring. No hydroureteronephrosis. No nephroureterolithiasis. No contour deforming renal mass. The urinary bladder is unremarkable. Bowel: No small or large bowel wall thickening or dilatation. Third portion of the duodenum diverticula (4:73, 8:63). Scattered colonic diverticulosis. The appendix not definitely identified. Mesentery, Omentum, and Peritoneum: No simple free fluid ascites. No pneumoperitoneum. No mesenteric hematoma identified. No organized fluid collection. Pelvic Organs: Normal. Lymph Nodes: No abdominal, pelvic, inguinal lymphadenopathy. Vasculature: Severe atherosclerotic plaque. No abdominal aorta or iliac aneurysm. Musculoskeletal: No significant soft  tissue hematoma. Right femoral head avascular necrosis. No acute pelvic fracture, cortical irregularity noted on x-ray represents mild degenerative changes. No spinal fracture. Intervertebral disc space vacuum phenomenon. Grade 1 anterolisthesis of L4 on L5. IMPRESSION: 1. A right parietooccipital 1.5 cm scalp hematoma with no acute intracranial abnormality and no underlying calvarial fracture. 2. No acute displaced fracture or traumatic listhesis of the cervical spine. 3. Age-indeterminate nondisplaced left anterior 5, anterior 6, lateral 9th rib fractures. Chronic healed left lateral eighth rib fracture (is fractures described as acute on chest x-ray; however, is noted to be healed on CT). Correlate with physical exam for an acute component. 4. No acute traumatic injury to the chest, abdomen, or pelvis with limited evaluation on this noncontrast study. 5. No acute fracture or traumatic malalignment of the thoracic or lumbar spine. 6. No acute pelvic fracture or diastasis. No acute fracture or dislocation of the hips. 7. Extensive pansinusitis. Other imaging findings of potential clinical significance: 1. Duodenal and colonic diverticulosis. 2. Right femoral head avascular necrosis. 3. Severe bilateral shoulder degenerative changes. 4. Aortic Atherosclerosis (ICD10-I70.0) - severe including four-vessel coronary artery calcifications. Electronically Signed   By: Iven Finn M.D.   On: 03/20/2021 06:13   CT Chest Wo Contrast  Result Date: 03/20/2021 CLINICAL DATA:  Status post fall EXAM: CT HEAD WITHOUT CONTRAST CT CERVICAL SPINE WITHOUT CONTRAST CT CHEST, ABDOMEN AND PELVIS WITHOUT CONTRAST TECHNIQUE: Contiguous axial images were obtained from the base of the skull through the vertex without intravenous contrast. Multidetector CT imaging of the cervical spine was performed without intravenous contrast. Multiplanar CT image reconstructions were also generated. Multidetector CT imaging of the chest, abdomen and  pelvis was performed following the standard protocol without IV contrast. COMPARISON:  None. FINDINGS: CT HEAD FINDINGS Brain: Cerebral ventricle sizes are concordant with the degree of cerebral volume loss. Patchy and confluent areas of decreased attenuation are noted throughout the deep and periventricular white matter of the cerebral hemispheres bilaterally, compatible with chronic microvascular ischemic disease. No evidence of large-territorial acute infarction. No parenchymal hemorrhage. No mass lesion. No extra-axial collection. No mass effect or midline shift. No hydrocephalus. Basilar cisterns are patent. Vascular: No hyperdense vessel. Atherosclerotic calcifications are present within the cavernous internal carotid and vertebral arteries. Skull: No acute fracture or focal lesion. Sinuses/Orbits: Complete opacification of the right maxillary,  bilateral ethmoid, bilateral frontal, bilateral sphenoid sinuses. Almost complete opacification of the left maxillary sinus. Bilateral mastoid air cells are clear. The orbits are unremarkable. Other: Right parieto-occipital scalp edema and 1.5 cm hematoma. CT CERVICAL FINDINGS Alignment: Grade 1 anterolisthesis of C4 on C5 and C5 on C6 likely due to degenerative changes. Skull base and vertebrae: Multilevel severe degenerative changes of the spine no acute fracture. No aggressive appearing focal osseous lesion or focal pathologic process. Soft tissues and spinal canal: No prevertebral fluid or swelling. No visible canal hematoma. Upper chest: Unremarkable. Other: None. CHEST: Ports and Devices: None. Lungs/airways: No focal consolidation. There is a triangular subpleural 7 x 6 mm left lower nodule (6:85) that likely represents a intrapulmonary lymph node. There is an 8 by 4 mm subpleural left upper lobe nodule. Several other scattered pulmonary micronodules are noted within the lungs. No pulmonary mass. No pulmonary contusion or laceration. No pneumatocele formation.  Left base 6 mm arteriovenous malformation. The central airways are patent. Pleura: No pleural effusion. No pneumothorax. No hemothorax. Lymph Nodes: Limited evaluation for hilar lymphadenopathy on this noncontrast study. No mediastinal or axillary lymphadenopathy. Mediastinum: No pneumomediastinum. The thoracic aorta is normal in caliber. Severe calcified and noncalcified atherosclerotic plaque. Four-vessel coronary artery calcifications. The heart is normal in size. No significant pericardial effusion. The esophagus is unremarkable. The thyroid is unremarkable. Chest Wall / Breasts: No chest wall mass. Musculoskeletal: Age-indeterminate nondisplaced left anterior 5th rib fracture (4:38) and left anterior 6th rib fracture. Old healed left lateral 8th rib fracture. Age-indeterminate nondisplaced left lateral 8th rib fracture. Sternal fracture. No spinal fracture. Severe bilateral shoulder degenerative changes with deformity of the humeral heads and fluid noted within bilateral shoulder bursa. ABDOMEN / PELVIS: Liver: Not enlarged. No focal lesion. Biliary System: The gallbladder is otherwise unremarkable with no radio-opaque gallstones. No biliary ductal dilatation. Pancreas: Normal pancreatic contour. No main pancreatic duct dilatation. Spleen: Not enlarged. No focal lesion. Adrenal Glands: No nodularity bilaterally. Kidneys: Bilateral renal cortical scarring. No hydroureteronephrosis. No nephroureterolithiasis. No contour deforming renal mass. The urinary bladder is unremarkable. Bowel: No small or large bowel wall thickening or dilatation. Third portion of the duodenum diverticula (4:73, 8:63). Scattered colonic diverticulosis. The appendix not definitely identified. Mesentery, Omentum, and Peritoneum: No simple free fluid ascites. No pneumoperitoneum. No mesenteric hematoma identified. No organized fluid collection. Pelvic Organs: Normal. Lymph Nodes: No abdominal, pelvic, inguinal lymphadenopathy. Vasculature:  Severe atherosclerotic plaque. No abdominal aorta or iliac aneurysm. Musculoskeletal: No significant soft tissue hematoma. Right femoral head avascular necrosis. No acute pelvic fracture, cortical irregularity noted on x-ray represents mild degenerative changes. No spinal fracture. Intervertebral disc space vacuum phenomenon. Grade 1 anterolisthesis of L4 on L5. IMPRESSION: 1. A right parietooccipital 1.5 cm scalp hematoma with no acute intracranial abnormality and no underlying calvarial fracture. 2. No acute displaced fracture or traumatic listhesis of the cervical spine. 3. Age-indeterminate nondisplaced left anterior 5, anterior 6, lateral 9th rib fractures. Chronic healed left lateral eighth rib fracture (is fractures described as acute on chest x-ray; however, is noted to be healed on CT). Correlate with physical exam for an acute component. 4. No acute traumatic injury to the chest, abdomen, or pelvis with limited evaluation on this noncontrast study. 5. No acute fracture or traumatic malalignment of the thoracic or lumbar spine. 6. No acute pelvic fracture or diastasis. No acute fracture or dislocation of the hips. 7. Extensive pansinusitis. Other imaging findings of potential clinical significance: 1. Duodenal and colonic diverticulosis. 2. Right femoral head  avascular necrosis. 3. Severe bilateral shoulder degenerative changes. 4. Aortic Atherosclerosis (ICD10-I70.0) - severe including four-vessel coronary artery calcifications. Electronically Signed   By: Iven Finn M.D.   On: 03/20/2021 06:13   CT Cervical Spine Wo Contrast  Result Date: 03/20/2021 CLINICAL DATA:  Status post fall EXAM: CT HEAD WITHOUT CONTRAST CT CERVICAL SPINE WITHOUT CONTRAST CT CHEST, ABDOMEN AND PELVIS WITHOUT CONTRAST TECHNIQUE: Contiguous axial images were obtained from the base of the skull through the vertex without intravenous contrast. Multidetector CT imaging of the cervical spine was performed without intravenous  contrast. Multiplanar CT image reconstructions were also generated. Multidetector CT imaging of the chest, abdomen and pelvis was performed following the standard protocol without IV contrast. COMPARISON:  None. FINDINGS: CT HEAD FINDINGS Brain: Cerebral ventricle sizes are concordant with the degree of cerebral volume loss. Patchy and confluent areas of decreased attenuation are noted throughout the deep and periventricular white matter of the cerebral hemispheres bilaterally, compatible with chronic microvascular ischemic disease. No evidence of large-territorial acute infarction. No parenchymal hemorrhage. No mass lesion. No extra-axial collection. No mass effect or midline shift. No hydrocephalus. Basilar cisterns are patent. Vascular: No hyperdense vessel. Atherosclerotic calcifications are present within the cavernous internal carotid and vertebral arteries. Skull: No acute fracture or focal lesion. Sinuses/Orbits: Complete opacification of the right maxillary, bilateral ethmoid, bilateral frontal, bilateral sphenoid sinuses. Almost complete opacification of the left maxillary sinus. Bilateral mastoid air cells are clear. The orbits are unremarkable. Other: Right parieto-occipital scalp edema and 1.5 cm hematoma. CT CERVICAL FINDINGS Alignment: Grade 1 anterolisthesis of C4 on C5 and C5 on C6 likely due to degenerative changes. Skull base and vertebrae: Multilevel severe degenerative changes of the spine no acute fracture. No aggressive appearing focal osseous lesion or focal pathologic process. Soft tissues and spinal canal: No prevertebral fluid or swelling. No visible canal hematoma. Upper chest: Unremarkable. Other: None. CHEST: Ports and Devices: None. Lungs/airways: No focal consolidation. There is a triangular subpleural 7 x 6 mm left lower nodule (6:85) that likely represents a intrapulmonary lymph node. There is an 8 by 4 mm subpleural left upper lobe nodule. Several other scattered pulmonary  micronodules are noted within the lungs. No pulmonary mass. No pulmonary contusion or laceration. No pneumatocele formation. Left base 6 mm arteriovenous malformation. The central airways are patent. Pleura: No pleural effusion. No pneumothorax. No hemothorax. Lymph Nodes: Limited evaluation for hilar lymphadenopathy on this noncontrast study. No mediastinal or axillary lymphadenopathy. Mediastinum: No pneumomediastinum. The thoracic aorta is normal in caliber. Severe calcified and noncalcified atherosclerotic plaque. Four-vessel coronary artery calcifications. The heart is normal in size. No significant pericardial effusion. The esophagus is unremarkable. The thyroid is unremarkable. Chest Wall / Breasts: No chest wall mass. Musculoskeletal: Age-indeterminate nondisplaced left anterior 5th rib fracture (4:38) and left anterior 6th rib fracture. Old healed left lateral 8th rib fracture. Age-indeterminate nondisplaced left lateral 8th rib fracture. Sternal fracture. No spinal fracture. Severe bilateral shoulder degenerative changes with deformity of the humeral heads and fluid noted within bilateral shoulder bursa. ABDOMEN / PELVIS: Liver: Not enlarged. No focal lesion. Biliary System: The gallbladder is otherwise unremarkable with no radio-opaque gallstones. No biliary ductal dilatation. Pancreas: Normal pancreatic contour. No main pancreatic duct dilatation. Spleen: Not enlarged. No focal lesion. Adrenal Glands: No nodularity bilaterally. Kidneys: Bilateral renal cortical scarring. No hydroureteronephrosis. No nephroureterolithiasis. No contour deforming renal mass. The urinary bladder is unremarkable. Bowel: No small or large bowel wall thickening or dilatation. Third portion of the duodenum diverticula (4:73,  8:63). Scattered colonic diverticulosis. The appendix not definitely identified. Mesentery, Omentum, and Peritoneum: No simple free fluid ascites. No pneumoperitoneum. No mesenteric hematoma identified. No  organized fluid collection. Pelvic Organs: Normal. Lymph Nodes: No abdominal, pelvic, inguinal lymphadenopathy. Vasculature: Severe atherosclerotic plaque. No abdominal aorta or iliac aneurysm. Musculoskeletal: No significant soft tissue hematoma. Right femoral head avascular necrosis. No acute pelvic fracture, cortical irregularity noted on x-ray represents mild degenerative changes. No spinal fracture. Intervertebral disc space vacuum phenomenon. Grade 1 anterolisthesis of L4 on L5. IMPRESSION: 1. A right parietooccipital 1.5 cm scalp hematoma with no acute intracranial abnormality and no underlying calvarial fracture. 2. No acute displaced fracture or traumatic listhesis of the cervical spine. 3. Age-indeterminate nondisplaced left anterior 5, anterior 6, lateral 9th rib fractures. Chronic healed left lateral eighth rib fracture (is fractures described as acute on chest x-ray; however, is noted to be healed on CT). Correlate with physical exam for an acute component. 4. No acute traumatic injury to the chest, abdomen, or pelvis with limited evaluation on this noncontrast study. 5. No acute fracture or traumatic malalignment of the thoracic or lumbar spine. 6. No acute pelvic fracture or diastasis. No acute fracture or dislocation of the hips. 7. Extensive pansinusitis. Other imaging findings of potential clinical significance: 1. Duodenal and colonic diverticulosis. 2. Right femoral head avascular necrosis. 3. Severe bilateral shoulder degenerative changes. 4. Aortic Atherosclerosis (ICD10-I70.0) - severe including four-vessel coronary artery calcifications. Electronically Signed   By: Iven Finn M.D.   On: 03/20/2021 06:13   DG Pelvis Portable  Result Date: 03/20/2021 CLINICAL DATA:  Status post fall. EXAM: PORTABLE PELVIS 1-2 VIEWS COMPARISON:  None. FINDINGS: There is no evidence of pelvic fracture or diastasis. Slight cortical irregularity along the left femoral neck possibly due to overlying vascular  calcifications. No acute displaced fracture or dislocation of bilateral hips. No pelvic bone lesions are seen. Vascular calcifications. IMPRESSION: No definite acute displaced fracture, pelvic diastasis, hip dislocation. Electronically Signed   By: Iven Finn M.D.   On: 03/20/2021 05:49   DG Chest Portable 1 View  Result Date: 03/20/2021 CLINICAL DATA:  Status post fall EXAM: PORTABLE CHEST 1 VIEW COMPARISON:  None. FINDINGS: The heart size and mediastinal contours are unchanged. Aortic arch calcification. Coronary artery stents. No focal consolidation. No pulmonary edema. No pleural effusion. No pneumothorax. Old healed right fourth rib fracture. Likely acute minimally displaced left lateral eighth rib fracture. Question nondisplaced left lateral tenth rib fracture. Bilateral shoulder degenerative changes. IMPRESSION: Acute minimally displaced left lateral eighth rib fracture. No radiographic finding of pneumothorax. Electronically Signed   By: Iven Finn M.D.   On: 03/20/2021 05:47    Assessment/Plan 1. Fall at home, subsequent encounter S/p fall rolled off the bed sustained abrasion on left side of the head and several bruises which are progressively healing.  2. Low hemoglobin Hgb 11.2 stable from previous. Continue on ferrous sulfate. - CBC with Differential/Platelet  3. Slow transit constipation Senokot-[id number]  - encouraged to increase fiber in diet  - increase water intake to 6-8 glasses of water daily and exercise as tolerated   4. Bilateral impacted cerumen TM not visualized. advised to instil debrox 6.5 % otic solution 5 drops into each ear twice daily x 4 days then follow up today for ear lavage  Family/ staff Communication: Reviewed plan of care with patient and daughter verbalized understanding  Labs/tests ordered: - CBC with Differential/Platelet  Next Appointment: 5 days for bilateral ear lavage   Ricketta Colantonio C Aiza Vollrath,  NP

## 2021-04-01 ENCOUNTER — Encounter: Payer: Self-pay | Admitting: Family

## 2021-04-01 ENCOUNTER — Ambulatory Visit (INDEPENDENT_AMBULATORY_CARE_PROVIDER_SITE_OTHER): Payer: Medicare Other | Admitting: Family

## 2021-04-01 ENCOUNTER — Other Ambulatory Visit: Payer: Self-pay

## 2021-04-01 VITALS — BP 138/60 | HR 60 | Temp 97.7°F | Resp 16 | Ht 68.0 in | Wt 168.0 lb

## 2021-04-01 DIAGNOSIS — H6123 Impacted cerumen, bilateral: Secondary | ICD-10-CM

## 2021-04-01 DIAGNOSIS — I2510 Atherosclerotic heart disease of native coronary artery without angina pectoris: Secondary | ICD-10-CM | POA: Diagnosis not present

## 2021-04-01 NOTE — Progress Notes (Signed)
Location:      Place of Service:    Provider: Vester Balthazor FNP-C  [name], NP  Patient Care Team: [name], NP as PCP - General (Adult Health Nurse Practitioner) Vickie Epley, MD as PCP - Electrophysiology (Cardiology) Jari Pigg, MD as Consulting Physician (Dermatology) Donato Heinz, MD as Consulting Physician (Cardiology) [name], NP (Adult Health Nurse Practitioner)  Extended Emergency Contact Information Primary Emergency Contact: Kreager,Page Address: [place], [place] [address] [address] of [place] Phone: [phone] Mobile Phone: [phone] Relation: Daughter Preferred language: English  Code Status:  DNR Goals of care: Advanced Directive information Advanced Directives 04/01/2021  Does Patient Have a Medical Advance Directive? Yes  Type of Advance Directive [place]  Does patient want to make changes to medical advance directive? No - Patient declined  Copy of [place] in Chart? Yes - validated most recent copy scanned in chart (See row information)     Chief Complaint  Patient presents with   Follow-up    Ear Lavage     HPI:  Pt is a 87 y.o. male seen today for an acute visit for evaluation of    Past Medical History:  Diagnosis Date   Acute anemia 09/26/2020   Anemia due to acute blood loss 05/25/2019   Chronic maxillary sinusitis 08/06/2017   Coronary artery disease due to lipid rich plaque 07/27/2019   Essential hypertension 02/14/2003   Fainting 05/30/2019   Fall 05/25/1999   High cholesterol 03/14/2001   History of basal cell carcinoma 11/02/2019   History of pneumonia    History of snoring    History of upper respiratory infection    Irregular heartbeat 08/06/2003   Ischemic cardiomyopathy 05/30/2019   Mild cognitive impairment 05/15/2019   NSTEMI (non-ST elevated myocardial infarction) ([facility]) 06/03/2019   Poor renal function 03/19/2005    Primary osteoarthritis of shoulder 10/24/2015   Psoriasis 12/29/2004   Rash and nonspecific skin eruption 04/30/2020   Schatzki's ring 08/07/2020   Sepsis with acute hypoxic respiratory failure ([facility]) 09/26/2020   Sleep apnea 08/13/2003   Past Surgical History:  Procedure Laterality Date   APPENDECTOMY     CATARACT EXTRACTION     COLONOSCOPY     HERNIA REPAIR     RIGHT HEART CATHETERIZATION WITH ADENOSINE STUDY     UPPER GI ENDOSCOPY      Allergies  Allergen Reactions   Lisinopril Swelling   Sulfamethoxazole-Trimethoprim Other (See Comments)   Telmisartan Hives   Atorvastatin Rash   Sulfa Antibiotics Rash    Outpatient Encounter Medications as of 04/01/2021  Medication Sig   acetaminophen (TYLENOL) 500 MG tablet Take 500 mg by mouth 2 (two) times daily.   albuterol (PROVENTIL) (2.5 MG/3ML) 0.083% nebulizer solution Take 2.5 mg by nebulization every 6 (six) hours as needed for wheezing or shortness of breath.   apixaban (ELIQUIS) 5 MG TABS tablet Take 5 mg by mouth 2 (two) times daily.   aspirin EC 81 MG tablet Take 1 tablet (81 mg total) by mouth daily. Swallow whole.   benzonatate (TESSALON) 100 MG capsule Take 100 mg by mouth 3 (three) times daily as needed for cough.   CALCIUM CITRATE PO Take 1 tablet by mouth 2 (two) times daily.   carbamide peroxide (DEBROX) 6.5 % OTIC solution Place 5 drops into both ears 2 (two) times daily.   empagliflozin (JARDIANCE) 10 MG TABS  tablet Take 1 tablet (10 mg total) by mouth daily before breakfast.   ferrous sulfate 325 (65 FE) MG tablet Take 325 mg by mouth daily.   fluticasone (FLONASE) 50 MCG/ACT nasal spray Place 2 sprays into both nostrils 2 (two) times daily.   fluticasone furoate-vilanterol (BREO ELLIPTA) 100-25 MCG/INH AEPB Inhale 1 puff into the lungs daily.   furosemide (LASIX) 20 MG tablet Take 1 tablet (20 mg total) by mouth as needed (weight increase of 3 lbs in 1 day or 5 lbs in 1 week).   halobetasol (ULTRAVATE) 0.05 %  cream Apply 1 application topically 3 (three) times a week. Spread topically to arms, thighs, and legs. Monday, Wednesday, and Friday.   halobetasol (ULTRAVATE) 0.05 % cream Apply 1 application topically every other day. Spread topically to back and belt line.   ketoconazole (NIZORAL) 2 % cream Apply topically daily.   levocetirizine (XYZAL) 5 MG tablet Take 5 mg by mouth daily.   levothyroxine (SYNTHROID) 88 MCG tablet Take 1 tablet (88 mcg total) by mouth daily.   losartan (COZAAR) 50 MG tablet Take 50 mg by mouth in the morning.   Magnesium Hydroxide (DULCOLAX PO) Take by mouth as needed.   metoprolol succinate (TOPROL-XL) 50 MG 24 hr tablet Take 50 mg by mouth daily. Take with or immediately following a meal.   montelukast (SINGULAIR) 10 MG tablet Take 10 mg by mouth daily.   Multiple Vitamin (MULTIVITAMIN PO) Take 1 tablet by mouth daily.   pantoprazole (PROTONIX) 40 MG tablet Take 40 mg by mouth daily.   polyethylene glycol (MIRALAX / GLYCOLAX) 17 g packet Take 17 g by mouth daily as needed.   rosuvastatin (CRESTOR) 20 MG tablet Take 20 mg by mouth daily.   Saw Palmetto, Serenoa repens, (SAW PALMETTO FRUIT PO) Take 450 mg by mouth 2 (two) times daily.   senna-docusate (SENOKOT-S) 8.6-50 MG tablet Take 1 tablet by mouth 2 (two) times daily.   spironolactone (ALDACTONE) 25 MG tablet Take 0.5 tablets (12.5 mg total) by mouth daily.   triamcinolone cream (KENALOG) 0.1 % Apply 1 application topically as needed.   No facility-administered encounter medications on file as of 04/01/2021.    Review of Systems  Constitutional:  Negative for appetite change, chills, fatigue and fever.  HENT:  Negative for congestion, ear discharge, ear pain, facial swelling and hearing loss.   Respiratory:  Negative for cough, chest tightness, shortness of breath and wheezing.   Cardiovascular:  Negative for chest pain, palpitations and leg swelling.  Musculoskeletal:  Positive for gait problem. Negative for  arthralgias.  Skin:  Negative for color change, pallor and rash.  Neurological:  Negative for dizziness, syncope, speech difficulty, weakness, light-headedness, numbness and headaches.  Hematological:  Does not bruise/bleed easily.  Psychiatric/Behavioral:  Negative for agitation, behavioral problems and confusion. The patient is not nervous/anxious.    Immunization History  Administered Date(s) Administered   Fluad Quad(high Dose 65+) 07/06/2017   Influenza Split 09/16/2000, 08/25/2001, 07/10/2002, 07/23/2003, 08/14/2004, 08/12/2005, 08/18/2006   Influenza Whole 07/15/2011   Influenza, High Dose Seasonal PF 08/10/2014, 08/13/2015, 07/16/2016, 06/15/2019, 06/19/2020   Influenza, Seasonal, Injecte, Preservative Fre 08/09/2007, 09/19/2008, 09/10/2009, 08/06/2010, 07/18/2012   Influenza,inj,Quad PF,6+ Mos 07/14/2013, 07/01/2018   Influenza,trivalent, recombinat, inj, PF 08/09/2007, 09/19/2008, 09/10/2009, 08/06/2010, 07/18/2012   Influenza-Unspecified 09/16/2000, 08/25/2001, 07/10/2002, 07/23/2003, 08/14/2004, 08/12/2005, 08/18/2006, 07/14/2013, 07/01/2018   Moderna Sars-Covid-2 Vaccination 10/25/2019, 11/22/2019, 08/23/2020   Pneumococcal Conjugate-13 10/12/1998, 08/06/2010, 08/10/2014   Pneumococcal Polysaccharide-23 08/06/2010, 05/15/2016   Pneumococcal-Unspecified 10/12/1998, 08/06/2010  Zoster, Live 08/26/2012   Pertinent  Health Maintenance Due  Topic Date Due   INFLUENZA VACCINE  05/12/2021   PNA vac Low Risk Adult  Completed   Fall Risk  04/01/2021 03/27/2021 12/10/2020  Falls in the past year? 0 1 0  Number falls in past yr: 0 1 0  Injury with Fall? 0 1 0  Risk for fall due to : No Fall Risks History of fall(s) -   Functional Status Survey:    Vitals:   04/01/21 1506  BP: 138/60  Pulse: 60  Resp: 16  Temp: 97.7 F (36.5 C)  SpO2: 98%  Weight: 168 lb (76.2 kg)  Height: 5\' 8"  (1.727 m)   Body mass index is 25.54 kg/m. Physical Exam Vitals reviewed.  Constitutional:       General: He is not in acute distress.    Appearance: Normal appearance. He is overweight. He is not ill-appearing or diaphoretic.  HENT:     Head: Normocephalic.     Right Ear: There is impacted cerumen.     Left Ear: There is impacted cerumen.     Ears:     Comments: Bilateral ear cerumen lavaged with warm water and hydrogen peroxide moderate amounts of cerumen obtained using curette.Tolerated procedure well.TM clear without any signs of infection.     Nose: Nose normal. No congestion or rhinorrhea.     Mouth/Throat:     Mouth: Mucous membranes are moist.     Pharynx: Oropharynx is clear. No oropharyngeal exudate or posterior oropharyngeal erythema.  Eyes:     General: No scleral icterus.       Right eye: No discharge.        Left eye: No discharge.     Extraocular Movements: Extraocular movements intact.     Conjunctiva/sclera: Conjunctivae normal.     Pupils: Pupils are equal, round, and reactive to light.  Cardiovascular:     Rate and Rhythm: Normal rate and regular rhythm.     Pulses: Normal pulses.     Heart sounds: Normal heart sounds. No murmur heard.   No friction rub. No gallop.  Pulmonary:     Effort: Pulmonary effort is normal. No respiratory distress.     Breath sounds: Normal breath sounds. No wheezing, rhonchi or rales.  Chest:     Chest wall: No tenderness.  Musculoskeletal:        General: No swelling or tenderness.     Right lower leg: No edema.     Left lower leg: No edema.     Comments: Unsteady gait ambulates with a Rolator   Skin:    General: Skin is warm and dry.     Coloration: Skin is not pale.     Findings: No erythema or rash.  Neurological:     Mental Status: He is alert and oriented to person, place, and time.     Sensory: No sensory deficit.     Motor: No weakness.     Gait: Gait abnormal.  Psychiatric:        Mood and Affect: Mood normal.        Speech: Speech normal.        Behavior: Behavior normal.        Thought Content: Thought  content normal.        Judgment: Judgment normal.    Labs reviewed: Recent Labs    01/10/21 0946 01/17/21 1444 03/13/21 1118 03/20/21 0559 03/24/21 1108  NA 142 142 143 138 141  K 5.4* 5.0 4.8 4.3 5.1  CL 102 104 102 106 102  CO2 23 22 24   --  23  GLUCOSE 124* 80 124* 79 86  BUN 22 21 18 17 21   CREATININE 1.62* 1.32* 1.58* 1.60* 1.71*  CALCIUM 10.0 9.5 9.7  --  9.5  MG 2.3  --   --   --   --    Recent Labs    11/12/20 1629 11/26/20 1550 03/13/21 1118  AST 16 22 16   ALT 13 17 11   ALKPHOS  --  44 47  BILITOT 0.4 0.2 0.3  PROT 6.8 6.7 6.8  ALBUMIN  --  4.4 4.3   Recent Labs    11/12/20 1629 11/26/20 1550 12/10/20 0000 03/13/21 1118 03/20/21 0559 03/27/21 1037  WBC 10.7   < > 8.0 8.3  --  8.3  NEUTROABS 6,762  --  4,992  --   --  5,710  HGB 10.9*   < > 11.1* 11.9* 11.2* 11.1*  HCT 33.1*   < > 33.4* 35.8* 33.0* 34.4*  MCV 98.5   < > 101.8* 104*  --  104.9*  PLT 325   < > 332 312  --  298   < > = values in this interval not displayed.   Lab Results  Component Value Date   TSH 1.880 03/13/2021   No results found for: HGBA1C Lab Results  Component Value Date   CHOL 147 12/10/2020   HDL 51 12/10/2020   LDLCALC 71 12/10/2020   TRIG 176 (H) 12/10/2020   CHOLHDL 2.9 12/10/2020    Significant Diagnostic Results in last 30 days:  CT ABDOMEN PELVIS WO CONTRAST  Result Date: 03/20/2021 CLINICAL DATA:  Status post fall EXAM: CT HEAD WITHOUT CONTRAST CT CERVICAL SPINE WITHOUT CONTRAST CT CHEST, ABDOMEN AND PELVIS WITHOUT CONTRAST TECHNIQUE: Contiguous axial images were obtained from the base of the skull through the vertex without intravenous contrast. Multidetector CT imaging of the cervical spine was performed without intravenous contrast. Multiplanar CT image reconstructions were also generated. Multidetector CT imaging of the chest, abdomen and pelvis was performed following the standard protocol without IV contrast. COMPARISON:  None. FINDINGS: CT HEAD FINDINGS  Brain: Cerebral ventricle sizes are concordant with the degree of cerebral volume loss. Patchy and confluent areas of decreased attenuation are noted throughout the deep and periventricular white matter of the cerebral hemispheres bilaterally, compatible with chronic microvascular ischemic disease. No evidence of large-territorial acute infarction. No parenchymal hemorrhage. No mass lesion. No extra-axial collection. No mass effect or midline shift. No hydrocephalus. Basilar cisterns are patent. Vascular: No hyperdense vessel. Atherosclerotic calcifications are present within the cavernous internal carotid and vertebral arteries. Skull: No acute fracture or focal lesion. Sinuses/Orbits: Complete opacification of the right maxillary, bilateral ethmoid, bilateral frontal, bilateral sphenoid sinuses. Almost complete opacification of the left maxillary sinus. Bilateral mastoid air cells are clear. The orbits are unremarkable. Other: Right parieto-occipital scalp edema and 1.5 cm hematoma. CT CERVICAL FINDINGS Alignment: Grade 1 anterolisthesis of C4 on C5 and C5 on C6 likely due to degenerative changes. Skull base and vertebrae: Multilevel severe degenerative changes of the spine no acute fracture. No aggressive appearing focal osseous lesion or focal pathologic process. Soft tissues and spinal canal: No prevertebral fluid or swelling. No visible canal hematoma. Upper chest: Unremarkable. Other: None. CHEST: Ports and Devices: None. Lungs/airways: No focal consolidation. There is a triangular subpleural 7 x 6 mm left lower nodule (6:85) that likely represents a intrapulmonary lymph node.  There is an 8 by 4 mm subpleural left upper lobe nodule. Several other scattered pulmonary micronodules are noted within the lungs. No pulmonary mass. No pulmonary contusion or laceration. No pneumatocele formation. Left base 6 mm arteriovenous malformation. The central airways are patent. Pleura: No pleural effusion. No pneumothorax.  No hemothorax. Lymph Nodes: Limited evaluation for hilar lymphadenopathy on this noncontrast study. No mediastinal or axillary lymphadenopathy. Mediastinum: No pneumomediastinum. The thoracic aorta is normal in caliber. Severe calcified and noncalcified atherosclerotic plaque. Four-vessel coronary artery calcifications. The heart is normal in size. No significant pericardial effusion. The esophagus is unremarkable. The thyroid is unremarkable. Chest Wall / Breasts: No chest wall mass. Musculoskeletal: Age-indeterminate nondisplaced left anterior 5th rib fracture (4:38) and left anterior 6th rib fracture. Old healed left lateral 8th rib fracture. Age-indeterminate nondisplaced left lateral 8th rib fracture. Sternal fracture. No spinal fracture. Severe bilateral shoulder degenerative changes with deformity of the humeral heads and fluid noted within bilateral shoulder bursa. ABDOMEN / PELVIS: Liver: Not enlarged. No focal lesion. Biliary System: The gallbladder is otherwise unremarkable with no radio-opaque gallstones. No biliary ductal dilatation. Pancreas: Normal pancreatic contour. No main pancreatic duct dilatation. Spleen: Not enlarged. No focal lesion. Adrenal Glands: No nodularity bilaterally. Kidneys: Bilateral renal cortical scarring. No hydroureteronephrosis. No nephroureterolithiasis. No contour deforming renal mass. The urinary bladder is unremarkable. Bowel: No small or large bowel wall thickening or dilatation. Third portion of the duodenum diverticula (4:73, 8:63). Scattered colonic diverticulosis. The appendix not definitely identified. Mesentery, Omentum, and Peritoneum: No simple free fluid ascites. No pneumoperitoneum. No mesenteric hematoma identified. No organized fluid collection. Pelvic Organs: Normal. Lymph Nodes: No abdominal, pelvic, inguinal lymphadenopathy. Vasculature: Severe atherosclerotic plaque. No abdominal aorta or iliac aneurysm. Musculoskeletal: No significant soft tissue hematoma.  Right femoral head avascular necrosis. No acute pelvic fracture, cortical irregularity noted on x-ray represents mild degenerative changes. No spinal fracture. Intervertebral disc space vacuum phenomenon. Grade 1 anterolisthesis of L4 on L5. IMPRESSION: 1. A right parietooccipital 1.5 cm scalp hematoma with no acute intracranial abnormality and no underlying calvarial fracture. 2. No acute displaced fracture or traumatic listhesis of the cervical spine. 3. Age-indeterminate nondisplaced left anterior 5, anterior 6, lateral 9th rib fractures. Chronic healed left lateral eighth rib fracture (is fractures described as acute on chest x-ray; however, is noted to be healed on CT). Correlate with physical exam for an acute component. 4. No acute traumatic injury to the chest, abdomen, or pelvis with limited evaluation on this noncontrast study. 5. No acute fracture or traumatic malalignment of the thoracic or lumbar spine. 6. No acute pelvic fracture or diastasis. No acute fracture or dislocation of the hips. 7. Extensive pansinusitis. Other imaging findings of potential clinical significance: 1. Duodenal and colonic diverticulosis. 2. Right femoral head avascular necrosis. 3. Severe bilateral shoulder degenerative changes. 4. Aortic Atherosclerosis (ICD10-I70.0) - severe including four-vessel coronary artery calcifications. Electronically Signed   By: Iven Finn M.D.   On: 03/20/2021 06:13   CT Head Wo Contrast  Result Date: 03/20/2021 CLINICAL DATA:  Status post fall EXAM: CT HEAD WITHOUT CONTRAST CT CERVICAL SPINE WITHOUT CONTRAST CT CHEST, ABDOMEN AND PELVIS WITHOUT CONTRAST TECHNIQUE: Contiguous axial images were obtained from the base of the skull through the vertex without intravenous contrast. Multidetector CT imaging of the cervical spine was performed without intravenous contrast. Multiplanar CT image reconstructions were also generated. Multidetector CT imaging of the chest, abdomen and pelvis was  performed following the standard protocol without IV contrast. COMPARISON:  None. FINDINGS:  CT HEAD FINDINGS Brain: Cerebral ventricle sizes are concordant with the degree of cerebral volume loss. Patchy and confluent areas of decreased attenuation are noted throughout the deep and periventricular white matter of the cerebral hemispheres bilaterally, compatible with chronic microvascular ischemic disease. No evidence of large-territorial acute infarction. No parenchymal hemorrhage. No mass lesion. No extra-axial collection. No mass effect or midline shift. No hydrocephalus. Basilar cisterns are patent. Vascular: No hyperdense vessel. Atherosclerotic calcifications are present within the cavernous internal carotid and vertebral arteries. Skull: No acute fracture or focal lesion. Sinuses/Orbits: Complete opacification of the right maxillary, bilateral ethmoid, bilateral frontal, bilateral sphenoid sinuses. Almost complete opacification of the left maxillary sinus. Bilateral mastoid air cells are clear. The orbits are unremarkable. Other: Right parieto-occipital scalp edema and 1.5 cm hematoma. CT CERVICAL FINDINGS Alignment: Grade 1 anterolisthesis of C4 on C5 and C5 on C6 likely due to degenerative changes. Skull base and vertebrae: Multilevel severe degenerative changes of the spine no acute fracture. No aggressive appearing focal osseous lesion or focal pathologic process. Soft tissues and spinal canal: No prevertebral fluid or swelling. No visible canal hematoma. Upper chest: Unremarkable. Other: None. CHEST: Ports and Devices: None. Lungs/airways: No focal consolidation. There is a triangular subpleural 7 x 6 mm left lower nodule (6:85) that likely represents a intrapulmonary lymph node. There is an 8 by 4 mm subpleural left upper lobe nodule. Several other scattered pulmonary micronodules are noted within the lungs. No pulmonary mass. No pulmonary contusion or laceration. No pneumatocele formation. Left base 6  mm arteriovenous malformation. The central airways are patent. Pleura: No pleural effusion. No pneumothorax. No hemothorax. Lymph Nodes: Limited evaluation for hilar lymphadenopathy on this noncontrast study. No mediastinal or axillary lymphadenopathy. Mediastinum: No pneumomediastinum. The thoracic aorta is normal in caliber. Severe calcified and noncalcified atherosclerotic plaque. Four-vessel coronary artery calcifications. The heart is normal in size. No significant pericardial effusion. The esophagus is unremarkable. The thyroid is unremarkable. Chest Wall / Breasts: No chest wall mass. Musculoskeletal: Age-indeterminate nondisplaced left anterior 5th rib fracture (4:38) and left anterior 6th rib fracture. Old healed left lateral 8th rib fracture. Age-indeterminate nondisplaced left lateral 8th rib fracture. Sternal fracture. No spinal fracture. Severe bilateral shoulder degenerative changes with deformity of the humeral heads and fluid noted within bilateral shoulder bursa. ABDOMEN / PELVIS: Liver: Not enlarged. No focal lesion. Biliary System: The gallbladder is otherwise unremarkable with no radio-opaque gallstones. No biliary ductal dilatation. Pancreas: Normal pancreatic contour. No main pancreatic duct dilatation. Spleen: Not enlarged. No focal lesion. Adrenal Glands: No nodularity bilaterally. Kidneys: Bilateral renal cortical scarring. No hydroureteronephrosis. No nephroureterolithiasis. No contour deforming renal mass. The urinary bladder is unremarkable. Bowel: No small or large bowel wall thickening or dilatation. Third portion of the duodenum diverticula (4:73, 8:63). Scattered colonic diverticulosis. The appendix not definitely identified. Mesentery, Omentum, and Peritoneum: No simple free fluid ascites. No pneumoperitoneum. No mesenteric hematoma identified. No organized fluid collection. Pelvic Organs: Normal. Lymph Nodes: No abdominal, pelvic, inguinal lymphadenopathy. Vasculature: Severe  atherosclerotic plaque. No abdominal aorta or iliac aneurysm. Musculoskeletal: No significant soft tissue hematoma. Right femoral head avascular necrosis. No acute pelvic fracture, cortical irregularity noted on x-ray represents mild degenerative changes. No spinal fracture. Intervertebral disc space vacuum phenomenon. Grade 1 anterolisthesis of L4 on L5. IMPRESSION: 1. A right parietooccipital 1.5 cm scalp hematoma with no acute intracranial abnormality and no underlying calvarial fracture. 2. No acute displaced fracture or traumatic listhesis of the cervical spine. 3. Age-indeterminate nondisplaced left anterior 5, anterior 6,  lateral 9th rib fractures. Chronic healed left lateral eighth rib fracture (is fractures described as acute on chest x-ray; however, is noted to be healed on CT). Correlate with physical exam for an acute component. 4. No acute traumatic injury to the chest, abdomen, or pelvis with limited evaluation on this noncontrast study. 5. No acute fracture or traumatic malalignment of the thoracic or lumbar spine. 6. No acute pelvic fracture or diastasis. No acute fracture or dislocation of the hips. 7. Extensive pansinusitis. Other imaging findings of potential clinical significance: 1. Duodenal and colonic diverticulosis. 2. Right femoral head avascular necrosis. 3. Severe bilateral shoulder degenerative changes. 4. Aortic Atherosclerosis (ICD10-I70.0) - severe including four-vessel coronary artery calcifications. Electronically Signed   By: Iven Finn M.D.   On: 03/20/2021 06:13   CT Chest Wo Contrast  Result Date: 03/20/2021 CLINICAL DATA:  Status post fall EXAM: CT HEAD WITHOUT CONTRAST CT CERVICAL SPINE WITHOUT CONTRAST CT CHEST, ABDOMEN AND PELVIS WITHOUT CONTRAST TECHNIQUE: Contiguous axial images were obtained from the base of the skull through the vertex without intravenous contrast. Multidetector CT imaging of the cervical spine was performed without intravenous contrast.  Multiplanar CT image reconstructions were also generated. Multidetector CT imaging of the chest, abdomen and pelvis was performed following the standard protocol without IV contrast. COMPARISON:  None. FINDINGS: CT HEAD FINDINGS Brain: Cerebral ventricle sizes are concordant with the degree of cerebral volume loss. Patchy and confluent areas of decreased attenuation are noted throughout the deep and periventricular white matter of the cerebral hemispheres bilaterally, compatible with chronic microvascular ischemic disease. No evidence of large-territorial acute infarction. No parenchymal hemorrhage. No mass lesion. No extra-axial collection. No mass effect or midline shift. No hydrocephalus. Basilar cisterns are patent. Vascular: No hyperdense vessel. Atherosclerotic calcifications are present within the cavernous internal carotid and vertebral arteries. Skull: No acute fracture or focal lesion. Sinuses/Orbits: Complete opacification of the right maxillary, bilateral ethmoid, bilateral frontal, bilateral sphenoid sinuses. Almost complete opacification of the left maxillary sinus. Bilateral mastoid air cells are clear. The orbits are unremarkable. Other: Right parieto-occipital scalp edema and 1.5 cm hematoma. CT CERVICAL FINDINGS Alignment: Grade 1 anterolisthesis of C4 on C5 and C5 on C6 likely due to degenerative changes. Skull base and vertebrae: Multilevel severe degenerative changes of the spine no acute fracture. No aggressive appearing focal osseous lesion or focal pathologic process. Soft tissues and spinal canal: No prevertebral fluid or swelling. No visible canal hematoma. Upper chest: Unremarkable. Other: None. CHEST: Ports and Devices: None. Lungs/airways: No focal consolidation. There is a triangular subpleural 7 x 6 mm left lower nodule (6:85) that likely represents a intrapulmonary lymph node. There is an 8 by 4 mm subpleural left upper lobe nodule. Several other scattered pulmonary micronodules are  noted within the lungs. No pulmonary mass. No pulmonary contusion or laceration. No pneumatocele formation. Left base 6 mm arteriovenous malformation. The central airways are patent. Pleura: No pleural effusion. No pneumothorax. No hemothorax. Lymph Nodes: Limited evaluation for hilar lymphadenopathy on this noncontrast study. No mediastinal or axillary lymphadenopathy. Mediastinum: No pneumomediastinum. The thoracic aorta is normal in caliber. Severe calcified and noncalcified atherosclerotic plaque. Four-vessel coronary artery calcifications. The heart is normal in size. No significant pericardial effusion. The esophagus is unremarkable. The thyroid is unremarkable. Chest Wall / Breasts: No chest wall mass. Musculoskeletal: Age-indeterminate nondisplaced left anterior 5th rib fracture (4:38) and left anterior 6th rib fracture. Old healed left lateral 8th rib fracture. Age-indeterminate nondisplaced left lateral 8th rib fracture. Sternal fracture. No spinal  fracture. Severe bilateral shoulder degenerative changes with deformity of the humeral heads and fluid noted within bilateral shoulder bursa. ABDOMEN / PELVIS: Liver: Not enlarged. No focal lesion. Biliary System: The gallbladder is otherwise unremarkable with no radio-opaque gallstones. No biliary ductal dilatation. Pancreas: Normal pancreatic contour. No main pancreatic duct dilatation. Spleen: Not enlarged. No focal lesion. Adrenal Glands: No nodularity bilaterally. Kidneys: Bilateral renal cortical scarring. No hydroureteronephrosis. No nephroureterolithiasis. No contour deforming renal mass. The urinary bladder is unremarkable. Bowel: No small or large bowel wall thickening or dilatation. Third portion of the duodenum diverticula (4:73, 8:63). Scattered colonic diverticulosis. The appendix not definitely identified. Mesentery, Omentum, and Peritoneum: No simple free fluid ascites. No pneumoperitoneum. No mesenteric hematoma identified. No organized fluid  collection. Pelvic Organs: Normal. Lymph Nodes: No abdominal, pelvic, inguinal lymphadenopathy. Vasculature: Severe atherosclerotic plaque. No abdominal aorta or iliac aneurysm. Musculoskeletal: No significant soft tissue hematoma. Right femoral head avascular necrosis. No acute pelvic fracture, cortical irregularity noted on x-ray represents mild degenerative changes. No spinal fracture. Intervertebral disc space vacuum phenomenon. Grade 1 anterolisthesis of L4 on L5. IMPRESSION: 1. A right parietooccipital 1.5 cm scalp hematoma with no acute intracranial abnormality and no underlying calvarial fracture. 2. No acute displaced fracture or traumatic listhesis of the cervical spine. 3. Age-indeterminate nondisplaced left anterior 5, anterior 6, lateral 9th rib fractures. Chronic healed left lateral eighth rib fracture (is fractures described as acute on chest x-ray; however, is noted to be healed on CT). Correlate with physical exam for an acute component. 4. No acute traumatic injury to the chest, abdomen, or pelvis with limited evaluation on this noncontrast study. 5. No acute fracture or traumatic malalignment of the thoracic or lumbar spine. 6. No acute pelvic fracture or diastasis. No acute fracture or dislocation of the hips. 7. Extensive pansinusitis. Other imaging findings of potential clinical significance: 1. Duodenal and colonic diverticulosis. 2. Right femoral head avascular necrosis. 3. Severe bilateral shoulder degenerative changes. 4. Aortic Atherosclerosis (ICD10-I70.0) - severe including four-vessel coronary artery calcifications. Electronically Signed   By: Iven Finn M.D.   On: 03/20/2021 06:13   CT Cervical Spine Wo Contrast  Result Date: 03/20/2021 CLINICAL DATA:  Status post fall EXAM: CT HEAD WITHOUT CONTRAST CT CERVICAL SPINE WITHOUT CONTRAST CT CHEST, ABDOMEN AND PELVIS WITHOUT CONTRAST TECHNIQUE: Contiguous axial images were obtained from the base of the skull through the vertex  without intravenous contrast. Multidetector CT imaging of the cervical spine was performed without intravenous contrast. Multiplanar CT image reconstructions were also generated. Multidetector CT imaging of the chest, abdomen and pelvis was performed following the standard protocol without IV contrast. COMPARISON:  None. FINDINGS: CT HEAD FINDINGS Brain: Cerebral ventricle sizes are concordant with the degree of cerebral volume loss. Patchy and confluent areas of decreased attenuation are noted throughout the deep and periventricular white matter of the cerebral hemispheres bilaterally, compatible with chronic microvascular ischemic disease. No evidence of large-territorial acute infarction. No parenchymal hemorrhage. No mass lesion. No extra-axial collection. No mass effect or midline shift. No hydrocephalus. Basilar cisterns are patent. Vascular: No hyperdense vessel. Atherosclerotic calcifications are present within the cavernous internal carotid and vertebral arteries. Skull: No acute fracture or focal lesion. Sinuses/Orbits: Complete opacification of the right maxillary, bilateral ethmoid, bilateral frontal, bilateral sphenoid sinuses. Almost complete opacification of the left maxillary sinus. Bilateral mastoid air cells are clear. The orbits are unremarkable. Other: Right parieto-occipital scalp edema and 1.5 cm hematoma. CT CERVICAL FINDINGS Alignment: Grade 1 anterolisthesis of C4 on C5 and C5  on C6 likely due to degenerative changes. Skull base and vertebrae: Multilevel severe degenerative changes of the spine no acute fracture. No aggressive appearing focal osseous lesion or focal pathologic process. Soft tissues and spinal canal: No prevertebral fluid or swelling. No visible canal hematoma. Upper chest: Unremarkable. Other: None. CHEST: Ports and Devices: None. Lungs/airways: No focal consolidation. There is a triangular subpleural 7 x 6 mm left lower nodule (6:85) that likely represents a intrapulmonary  lymph node. There is an 8 by 4 mm subpleural left upper lobe nodule. Several other scattered pulmonary micronodules are noted within the lungs. No pulmonary mass. No pulmonary contusion or laceration. No pneumatocele formation. Left base 6 mm arteriovenous malformation. The central airways are patent. Pleura: No pleural effusion. No pneumothorax. No hemothorax. Lymph Nodes: Limited evaluation for hilar lymphadenopathy on this noncontrast study. No mediastinal or axillary lymphadenopathy. Mediastinum: No pneumomediastinum. The thoracic aorta is normal in caliber. Severe calcified and noncalcified atherosclerotic plaque. Four-vessel coronary artery calcifications. The heart is normal in size. No significant pericardial effusion. The esophagus is unremarkable. The thyroid is unremarkable. Chest Wall / Breasts: No chest wall mass. Musculoskeletal: Age-indeterminate nondisplaced left anterior 5th rib fracture (4:38) and left anterior 6th rib fracture. Old healed left lateral 8th rib fracture. Age-indeterminate nondisplaced left lateral 8th rib fracture. Sternal fracture. No spinal fracture. Severe bilateral shoulder degenerative changes with deformity of the humeral heads and fluid noted within bilateral shoulder bursa. ABDOMEN / PELVIS: Liver: Not enlarged. No focal lesion. Biliary System: The gallbladder is otherwise unremarkable with no radio-opaque gallstones. No biliary ductal dilatation. Pancreas: Normal pancreatic contour. No main pancreatic duct dilatation. Spleen: Not enlarged. No focal lesion. Adrenal Glands: No nodularity bilaterally. Kidneys: Bilateral renal cortical scarring. No hydroureteronephrosis. No nephroureterolithiasis. No contour deforming renal mass. The urinary bladder is unremarkable. Bowel: No small or large bowel wall thickening or dilatation. Third portion of the duodenum diverticula (4:73, 8:63). Scattered colonic diverticulosis. The appendix not definitely identified. Mesentery, Omentum, and  Peritoneum: No simple free fluid ascites. No pneumoperitoneum. No mesenteric hematoma identified. No organized fluid collection. Pelvic Organs: Normal. Lymph Nodes: No abdominal, pelvic, inguinal lymphadenopathy. Vasculature: Severe atherosclerotic plaque. No abdominal aorta or iliac aneurysm. Musculoskeletal: No significant soft tissue hematoma. Right femoral head avascular necrosis. No acute pelvic fracture, cortical irregularity noted on x-ray represents mild degenerative changes. No spinal fracture. Intervertebral disc space vacuum phenomenon. Grade 1 anterolisthesis of L4 on L5. IMPRESSION: 1. A right parietooccipital 1.5 cm scalp hematoma with no acute intracranial abnormality and no underlying calvarial fracture. 2. No acute displaced fracture or traumatic listhesis of the cervical spine. 3. Age-indeterminate nondisplaced left anterior 5, anterior 6, lateral 9th rib fractures. Chronic healed left lateral eighth rib fracture (is fractures described as acute on chest x-ray; however, is noted to be healed on CT). Correlate with physical exam for an acute component. 4. No acute traumatic injury to the chest, abdomen, or pelvis with limited evaluation on this noncontrast study. 5. No acute fracture or traumatic malalignment of the thoracic or lumbar spine. 6. No acute pelvic fracture or diastasis. No acute fracture or dislocation of the hips. 7. Extensive pansinusitis. Other imaging findings of potential clinical significance: 1. Duodenal and colonic diverticulosis. 2. Right femoral head avascular necrosis. 3. Severe bilateral shoulder degenerative changes. 4. Aortic Atherosclerosis (ICD10-I70.0) - severe including four-vessel coronary artery calcifications. Electronically Signed   By: Iven Finn M.D.   On: 03/20/2021 06:13   DG Pelvis Portable  Result Date: 03/20/2021 CLINICAL DATA:  Status post  fall. EXAM: PORTABLE PELVIS 1-2 VIEWS COMPARISON:  None. FINDINGS: There is no evidence of pelvic fracture or  diastasis. Slight cortical irregularity along the left femoral neck possibly due to overlying vascular calcifications. No acute displaced fracture or dislocation of bilateral hips. No pelvic bone lesions are seen. Vascular calcifications. IMPRESSION: No definite acute displaced fracture, pelvic diastasis, hip dislocation. Electronically Signed   By: Iven Finn M.D.   On: 03/20/2021 05:49   DG Chest Portable 1 View  Result Date: 03/20/2021 CLINICAL DATA:  Status post fall EXAM: PORTABLE CHEST 1 VIEW COMPARISON:  None. FINDINGS: The heart size and mediastinal contours are unchanged. Aortic arch calcification. Coronary artery stents. No focal consolidation. No pulmonary edema. No pleural effusion. No pneumothorax. Old healed right fourth rib fracture. Likely acute minimally displaced left lateral eighth rib fracture. Question nondisplaced left lateral tenth rib fracture. Bilateral shoulder degenerative changes. IMPRESSION: Acute minimally displaced left lateral eighth rib fracture. No radiographic finding of pneumothorax. Electronically Signed   By: Iven Finn M.D.   On: 03/20/2021 05:47    Assessment/Plan  Impacted cerumen of both ears Afebrile. Bilateral ear cerumen lavaged with warm water and hydrogen peroxide moderate amounts of cerumen obtained using curette.Tolerated procedure well.TM clear without any signs of infection. - Ear Lavage - Notify provider for any signs of infection  Family/ staff Communication: Reviewed plan of care with patient and daughter.Facility form completed original copy given to daughter to take to the patient's facility.Copy send to be scanned in Epic by [facility].   Labs/tests ordered: None   Next Appointment: Has upcoming appointment with PCP Amy Blair Dolphin, NP

## 2021-04-01 NOTE — Patient Instructions (Signed)
-   Notify provider provider for any fever,ear pain or drainage.

## 2021-04-10 ENCOUNTER — Encounter: Payer: Self-pay | Admitting: Orthopedic Surgery

## 2021-04-10 ENCOUNTER — Other Ambulatory Visit: Payer: Self-pay

## 2021-04-10 ENCOUNTER — Ambulatory Visit (INDEPENDENT_AMBULATORY_CARE_PROVIDER_SITE_OTHER): Payer: Medicare Other | Admitting: Orthopedic Surgery

## 2021-04-10 DIAGNOSIS — Z0000 Encounter for general adult medical examination without abnormal findings: Secondary | ICD-10-CM

## 2021-04-10 NOTE — Patient Instructions (Signed)
  Ronnie Joseph , Thank you for taking time to come for your Medicare Wellness Visit. I appreciate your ongoing commitment to your health goals. Please review the following plan we discussed and let me know if I can assist you in the future.   These are the goals we discussed:  Goals      DIET - INCREASE WATER INTAKE        This is a list of the screening recommended for you and due dates:  Health Maintenance  Topic Date Due   Tetanus Vaccine  Never done   Zoster (Shingles) Vaccine (1 of 2) Never done   COVID-19 Vaccine (4 - Booster for Moderna series) 11/23/2020   Flu Shot  05/12/2021   Pneumonia vaccines  Completed   HPV Vaccine  Aged Out

## 2021-04-10 NOTE — Progress Notes (Signed)
Subjective:   Ronnie Joseph is a 87 y.o. male who presents for Medicare Annual/Subsequent preventive examination.  Review of Systems     Cardiac Risk Factors include: advanced age (>26men, >31 women);hypertension;male gender     Objective:    Today's Vitals   04/10/21 1614  PainSc: 0-No pain   There is no height or weight on file to calculate BMI.  Advanced Directives 04/10/2021 04/01/2021 03/27/2021 12/10/2020 11/12/2020  Does Patient Have a Medical Advance Directive? Yes Yes Yes Yes Yes  Type of Paramedic of [place];Living will Healthcare Power of [place]  Does patient want to make changes to medical advance directive? No - Patient declined No - Patient declined No - Patient declined No - Patient declined No - Patient declined  Copy of [place] in Chart? Yes - validated most recent copy scanned in chart (See row information) Yes - validated most recent copy scanned in chart (See row information) Yes - validated most recent copy scanned in chart (See row information) Yes - validated most recent copy scanned in chart (See row information) Yes - validated most recent copy scanned in chart (See row information)    Current Medications (verified) Outpatient Encounter Medications as of 04/10/2021  Medication Sig   acetaminophen (TYLENOL) 500 MG tablet Take 500 mg by mouth 2 (two) times daily.   albuterol (PROVENTIL) (2.5 MG/3ML) 0.083% nebulizer solution Take 2.5 mg by nebulization every 6 (six) hours as needed for wheezing or shortness of breath.   apixaban (ELIQUIS) 5 MG TABS tablet Take 5 mg by mouth 2 (two) times daily.   aspirin EC 81 MG tablet Take 1 tablet (81 mg total) by mouth daily. Swallow whole.   benzonatate (TESSALON) 100 MG capsule Take 100 mg by mouth 3 (three) times daily as needed for cough.   CALCIUM CITRATE PO Take 1 tablet by mouth 2 (two)  times daily.   empagliflozin (JARDIANCE) 10 MG TABS tablet Take 1 tablet (10 mg total) by mouth daily before breakfast.   ferrous sulfate 325 (65 FE) MG tablet Take 325 mg by mouth daily.   fluticasone (FLONASE) 50 MCG/ACT nasal spray Place 2 sprays into both nostrils 2 (two) times daily.   fluticasone furoate-vilanterol (BREO ELLIPTA) 100-25 MCG/INH AEPB Inhale 1 puff into the lungs daily.   furosemide (LASIX) 20 MG tablet Take 1 tablet (20 mg total) by mouth as needed (weight increase of 3 lbs in 1 day or 5 lbs in 1 week).   halobetasol (ULTRAVATE) 0.05 % cream Apply 1 application topically 3 (three) times a week. Spread topically to arms, thighs, and legs. Monday, Wednesday, and Friday.   halobetasol (ULTRAVATE) 0.05 % cream Apply 1 application topically every other day. Spread topically to back and belt line.   ketoconazole (NIZORAL) 2 % cream Apply topically daily.   levocetirizine (XYZAL) 5 MG tablet Take 5 mg by mouth daily.   levothyroxine (SYNTHROID) 88 MCG tablet Take 1 tablet (88 mcg total) by mouth daily.   losartan (COZAAR) 50 MG tablet Take 50 mg by mouth in the morning.   Magnesium Hydroxide (DULCOLAX PO) Take by mouth as needed.   metoprolol succinate (TOPROL-XL) 50 MG 24 hr tablet Take 50 mg by mouth daily. Take with or immediately following a meal.   montelukast (SINGULAIR) 10 MG tablet Take 10 mg by mouth daily.   Multiple Vitamin (MULTIVITAMIN PO) Take 1 tablet by mouth  daily.   pantoprazole (PROTONIX) 40 MG tablet Take 40 mg by mouth daily.   rosuvastatin (CRESTOR) 20 MG tablet Take 20 mg by mouth daily.   Saw Palmetto, Serenoa repens, (SAW PALMETTO FRUIT PO) Take 450 mg by mouth 2 (two) times daily.   senna-docusate (SENOKOT-S) 8.6-50 MG tablet Take 1 tablet by mouth 2 (two) times daily.   spironolactone (ALDACTONE) 25 MG tablet Take 0.5 tablets (12.5 mg total) by mouth daily.   triamcinolone cream (KENALOG) 0.1 % Apply 1 application topically as needed.   [DISCONTINUED]  carbamide peroxide (DEBROX) 6.5 % OTIC solution Place 5 drops into both ears 2 (two) times daily.   [DISCONTINUED] polyethylene glycol (MIRALAX / GLYCOLAX) 17 g packet Take 17 g by mouth daily as needed.   No facility-administered encounter medications on file as of 04/10/2021.    Allergies (verified) Lisinopril, Sulfamethoxazole-trimethoprim, Telmisartan, Atorvastatin, and Sulfa antibiotics   History: Past Medical History:  Diagnosis Date   Acute anemia 09/26/2020   Anemia due to acute blood loss 05/25/2019   Chronic maxillary sinusitis 08/06/2017   Coronary artery disease due to lipid rich plaque 07/27/2019   Essential hypertension 02/14/2003   Fainting 05/30/2019   Fall 05/25/1999   High cholesterol 03/14/2001   History of basal cell carcinoma 11/02/2019   History of pneumonia    History of snoring    History of upper respiratory infection    Irregular heartbeat 08/06/2003   Ischemic cardiomyopathy 05/30/2019   Mild cognitive impairment 05/15/2019   NSTEMI (non-ST elevated myocardial infarction) ([place]) 06/03/2019   Poor renal function 03/19/2005   Primary osteoarthritis of shoulder 10/24/2015   Psoriasis 12/29/2004   Rash and nonspecific skin eruption 04/30/2020   Schatzki's ring 08/07/2020   Sepsis with acute hypoxic respiratory failure ([place]) 09/26/2020   Sleep apnea 08/13/2003   Past Surgical History:  Procedure Laterality Date   APPENDECTOMY     CATARACT EXTRACTION     COLONOSCOPY     HERNIA REPAIR     RIGHT HEART CATHETERIZATION WITH ADENOSINE STUDY     UPPER GI ENDOSCOPY     Family History  Problem Relation Age of Onset   Lung disease Mother    Heart attack Father    Brain cancer Brother    Breast cancer Daughter    Celiac disease Daughter    Diabetes Mellitus I Son    Social History   Socioeconomic History   Marital status: Widowed    Spouse name: Not on file   Number of children: Not on file   Years of education: Not on file   Highest education  level: Not on file  Occupational History   Not on file  Tobacco Use   Smoking status: Former    Packs/day: 0.25    Pack years: 0.00    Types: Cigarettes    Quit date: 12/17/1958    Years since quitting: 62.3   Smokeless tobacco: Never  Vaping Use   Vaping Use: Never used  Substance and Sexual Activity   Alcohol use: Yes    Alcohol/week: 3.0 standard drinks    Types: 3 Standard drinks or equivalent per week   Drug use: Never   Sexual activity: Not Currently  Other Topics Concern   Not on file  Social History Narrative   Tobacco use, amount per day now: 0   Past tobacco use, amount per day: Less than 1 pack   How many years did you use tobacco: 5 years, stopped in 1960   Alcohol  use (drinks per week): 4   Diet: N/A   Do you drink/eat things with caffeine: Yes.   Marital status: Widowed                                  What year were you married? 1960   Do you live in a house, apartment, assisted living, condo, trailer, etc.? Assisted Living.   Is it one or more stories? 1    How many persons live in your home? 1   Do you have pets in your home?( please list) No.   Highest Level of education completed? College   Current or past profession: Pharmacist   Do you exercise?  No.                                Type and how often?   Do you have a living will? Yes.   Do you have a DNR form?  Yes.                                 If not, do you want to discuss one?   Do you have signed POA/HPOA forms? Yes.                       If so, please bring to you appointment      Do you have any difficulty bathing or dressing yourself? Yes.   Do you have any difficulty preparing food or eating? Yes.   Do you have any difficulty managing your medications? Yes.   Do you have any difficulty managing your finances? No.   Do you have any difficulty affording your medications? No.   Social Determinants of Health   Financial Resource Strain: Not on file  Food Insecurity: Not on file   Transportation Needs: Not on file  Physical Activity: Not on file  Stress: Not on file  Social Connections: Not on file    Tobacco Counseling Counseling given: Not Answered   Clinical Intake:  Pre-visit preparation completed: Yes  Pain : No/denies pain Pain Score: 0-No pain BMI - recorded: 25.8 Nutritional Status: BMI 25 -29 Overweight Nutritional Risks: None Diabetes: No  How often do you need to have someone help you when you read instructions, pamphlets, or other written materials from your doctor or pharmacy?: 1 - Never What is the last grade level you completed in school?: Masters  Diabetic?no  Interpreter Needed?: No      Activities of Daily Living In your present state of health, do you have any difficulty performing the following activities: 04/10/2021  Hearing? N  Vision? N  Difficulty concentrating or making decisions? N  Walking or climbing stairs? N  Dressing or bathing? N  Doing errands, shopping? Y  Comment depends on daughter  Conservation officer, nature and eating ? N  Using the Toilet? N  In the past six months, have you accidently leaked urine? N  Do you have problems with loss of bowel control? N  Managing your Medications? N  Managing your Finances? N  Housekeeping or managing your Housekeeping? N    Patient Care Team: [name], NP as PCP - General (Adult Health Nurse Practitioner) Vickie Epley, MD as PCP - Electrophysiology (Cardiology) Jari Pigg, MD as Consulting Physician (Dermatology)  Donato Heinz, MD as Consulting Physician (Cardiology) [name], NP (Adult Health Nurse Practitioner)  Indicate any recent Medical Services you may have received from other than [facility] providers in the past year (date may be approximate).     Assessment:   This is a routine wellness examination for [place].  Hearing/Vision screen No results found.  Dietary issues and exercise activities discussed: Current Exercise Habits: Structured  exercise class, Type of exercise: walking;strength training/weights, Time (Minutes): 20, Frequency (Times/Week): 3, Weekly Exercise (Minutes/Week): 60, Exercise limited by: None identified   Goals Addressed             This Visit's Progress    DIET - INCREASE WATER INTAKE   Not on track      Depression Screen No flowsheet data found.  Fall Risk Fall Risk  04/10/2021 04/01/2021 03/27/2021 12/10/2020  Falls in the past year? 1 0 1 0  Number falls in past yr: 0 0 1 0  Injury with Fall? 1 0 1 0  Risk for fall due to : History of fall(s);Impaired vision;Impaired mobility;Orthopedic patient No Fall Risks History of fall(s) -  Follow up Falls evaluation completed;Education provided;Falls prevention discussed - - -    FALL RISK PREVENTION PERTAINING TO THE HOME:  Any stairs in or around the home? No  If so, are there any without handrails? No  Home free of loose throw rugs in walkways, pet beds, electrical cords, etc? Yes  Adequate lighting in your home to reduce risk of falls? Yes   ASSISTIVE DEVICES UTILIZED TO PREVENT FALLS:  Life alert? No  Use of a cane, walker or w/c? Yes  Grab bars in the bathroom? Yes  Shower chair or bench in shower? Yes  Elevated toilet seat or a handicapped toilet? Yes   TIMED UP AND GO:  Was the test performed? No .  Length of time to ambulate 10 feet:  sec.   Gait slow and steady with assistive device  Cognitive Function: MMSE - Mini Mental State Exam 12/10/2020  Orientation to time 5  Orientation to Place 5  Registration 3  Attention/ Calculation 5  Recall 0  Language- name 2 objects 2  Language- repeat 1  Language- follow 3 step command 3  Language- read & follow direction 1  Write a sentence 1  Copy design 1  Total score 27        Immunizations Immunization History  Administered Date(s) Administered   Fluad Quad(high Dose 65+) 07/06/2017   Influenza Split 09/16/2000, 08/25/2001, 07/10/2002, 07/23/2003, 08/14/2004, 08/12/2005,  08/18/2006   Influenza Whole 07/15/2011   Influenza, High Dose Seasonal PF 08/10/2014, 08/13/2015, 07/16/2016, 06/15/2019, 06/19/2020   Influenza, Seasonal, Injecte, Preservative Fre 08/09/2007, 09/19/2008, 09/10/2009, 08/06/2010, 07/18/2012   Influenza,inj,Quad PF,6+ Mos 07/14/2013, 07/01/2018   Influenza,trivalent, recombinat, inj, PF 08/09/2007, 09/19/2008, 09/10/2009, 08/06/2010, 07/18/2012   Influenza-Unspecified 09/16/2000, 08/25/2001, 07/10/2002, 07/23/2003, 08/14/2004, 08/12/2005, 08/18/2006, 07/14/2013, 07/01/2018   Moderna Sars-Covid-2 Vaccination 10/25/2019, 11/22/2019, 08/23/2020   Pneumococcal Conjugate-13 10/12/1998, 08/06/2010, 08/10/2014   Pneumococcal Polysaccharide-23 08/06/2010, 05/15/2016   Pneumococcal-Unspecified 10/12/1998, 08/06/2010   Zoster, Live 08/26/2012    TDAP status: Due, Education has been provided regarding the importance of this vaccine. Advised may receive this vaccine at local pharmacy or Health Dept. Aware to provide a copy of the vaccination record if obtained from local pharmacy or Health Dept. Verbalized acceptance and understanding.  Flu Vaccine status: Up to date  Pneumococcal vaccine status: Up to date  Covid-19 vaccine status: Completed vaccines  Qualifies for  Shingles Vaccine? Yes   Zostavax completed Yes   Shingrix Completed?: No.    Education has been provided regarding the importance of this vaccine. Patient has been advised to call insurance company to determine out of pocket expense if they have not yet received this vaccine. Advised may also receive vaccine at local pharmacy or Health Dept. Verbalized acceptance and understanding.  Screening Tests Health Maintenance  Topic Date Due   TETANUS/TDAP  Never done   Zoster Vaccines- Shingrix (1 of 2) Never done   COVID-19 Vaccine (4 - Booster for Moderna series) 11/23/2020   INFLUENZA VACCINE  05/12/2021   PNA vac Low Risk Adult  Completed   HPV VACCINES  Aged Out    Health  Maintenance  Health Maintenance Due  Topic Date Due   TETANUS/TDAP  Never done   Zoster Vaccines- Shingrix (1 of 2) Never done   COVID-19 Vaccine (4 - Booster for Moderna series) 11/23/2020    Colorectal cancer screening: No longer required.   Lung Cancer Screening: (Low Dose CT Chest recommended if Age 102-80 years, 30 pack-year currently smoking OR have quit w/in 15years.) does not qualify.   Lung Cancer Screening Referral: no   Additional Screening:  Hepatitis C Screening: does not qualify; Completed   Vision Screening: Recommended annual ophthalmology exams for early detection of glaucoma and other disorders of the eye. Is the patient up to date with their annual eye exam?  Yes  Who is the provider or what is the name of the office in which the patient attends annual eye exams? [facility] If pt is not established with a provider, would they like to be referred to a provider to establish care? No .   Dental Screening: Recommended annual dental exams for proper oral hygiene  Community Resource Referral / Chronic Care Management: CRR required this visit?  No   CCM required this visit?  No      Plan:     I have personally reviewed and noted the following in the patient's chart:   Medical and social history Use of alcohol, tobacco or illicit drugs  Current medications and supplements including opioid prescriptions. Patient is not currently taking opioid prescriptions. Functional ability and status Nutritional status Physical activity Advanced directives List of other physicians Hospitalizations, surgeries, and ER visits in previous 12 months Vitals Screenings to include cognitive, depression, and falls Referrals and appointments  In addition, I have reviewed and discussed with patient certain preventive protocols, quality metrics, and best practice recommendations. A written personalized care plan for preventive services as well as general preventive health  recommendations were provided to patient.     [name], NP   04/10/2021

## 2021-04-21 LAB — BASIC METABOLIC PANEL
BUN/Creatinine Ratio: 15 (ref 10–24)
BUN: 22 mg/dL (ref 8–27)
CO2: 21 mmol/L (ref 20–29)
Calcium: 9.4 mg/dL (ref 8.6–10.2)
Chloride: 103 mmol/L (ref 96–106)
Creatinine, Ser: 1.46 mg/dL — ABNORMAL HIGH (ref 0.76–1.27)
Glucose: 118 mg/dL — ABNORMAL HIGH (ref 65–99)
Potassium: 4.6 mmol/L (ref 3.5–5.2)
Sodium: 140 mmol/L (ref 134–144)
eGFR: 46 mL/min/{1.73_m2} — ABNORMAL LOW (ref >59)

## 2021-04-22 ENCOUNTER — Other Ambulatory Visit (INDEPENDENT_AMBULATORY_CARE_PROVIDER_SITE_OTHER): Payer: Medicare Other

## 2021-04-22 ENCOUNTER — Ambulatory Visit (INDEPENDENT_AMBULATORY_CARE_PROVIDER_SITE_OTHER): Payer: Medicare Other | Admitting: Nurse Practitioner

## 2021-04-22 ENCOUNTER — Encounter: Payer: Self-pay | Admitting: Nurse Practitioner

## 2021-04-22 VITALS — BP 128/50 | HR 60 | Ht 68.0 in | Wt 163.5 lb

## 2021-04-22 DIAGNOSIS — K5520 Angiodysplasia of colon without hemorrhage: Secondary | ICD-10-CM

## 2021-04-22 DIAGNOSIS — D518 Other vitamin B12 deficiency anemias: Secondary | ICD-10-CM

## 2021-04-22 DIAGNOSIS — D500 Iron deficiency anemia secondary to blood loss (chronic): Secondary | ICD-10-CM | POA: Diagnosis not present

## 2021-04-22 DIAGNOSIS — K5900 Constipation, unspecified: Secondary | ICD-10-CM

## 2021-04-22 LAB — CBC
HCT: 37.2 % — ABNORMAL LOW (ref 39.0–52.0)
Hemoglobin: 12.5 g/dL — ABNORMAL LOW (ref 13.0–17.0)
MCHC: 33.6 g/dL (ref 30.0–36.0)
MCV: 102.3 fl — ABNORMAL HIGH (ref 78.0–100.0)
Platelets: 343 10*3/uL (ref 150.0–400.0)
RBC: 3.63 Mil/uL — ABNORMAL LOW (ref 4.22–5.81)
RDW: 14 % (ref 11.5–15.5)
WBC: 9.7 10*3/uL (ref 4.0–10.5)

## 2021-04-22 LAB — B12 AND FOLATE PANEL
Folate: >24.4 ng/mL (ref >5.9)
Vitamin B-12: 450 pg/mL (ref 211–911)

## 2021-04-22 NOTE — Progress Notes (Addendum)
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 He presents to our office today as referred by his cardiologist Dr. Oswaldo Milian for further evaluation regarding iron deficiency anemia. He is accompanied by his daughter Page.  His primary concern is related to having constipation while on ferrous sulfate for which he takes for iron deficiency anemia.  He is taking Senna 1 tab twice daily and Dulcolax 3 tabs every third night which results in passing a solid black stool followed by 1 or 2 looser black stools daily.  He stated his stool color remains black since he started taking oral iron.  He denies seeing any bright red rectal per the rectum.  He previously was on Plavix which was discontinued several months ago. He remains on Eliquis for atrial fibrillation.  He denies having any upper or lower abdominal pain.  His appetite is good and his weight is stable.  Daughter with history of celiac disease.  No known family history of esophageal, gastric or colon cancer.   He has a history of iron deficiency anemia thought to be secondary to duodenal and colonic AVMs previously followed by gastroenterologist Dr. Rhea Bleacher and Rozanna Boer PA-C at [facility].  He underwent an EGD and colonoscopy 08/07/2020 by Dr. Rhea Bleacher. The EGD identified a Schatzki's ring and a duodenal AVM.  A colonoscopy identified a nonbleeding cecal AVM, sigmoid diverticulosis and internal hemorrhoids.  He was admitted to the hospital 09/26/2020 secondary to CP, SOB, significantly elevated troponin levels  with a positive NSTEMI, decompensated heart failure, acute hypoxic respiratory failure and symptomatic anemia. Admission Hg 7.5. He was transfused 2 units of PRBCs.  Chest/abdominal CTA showed small pleural effusions and interstitial pulmonary edema otherwise was negative.  He underwent an EGD and colonoscopy during this admission on 09/30/2020.  The EGD showed duodenal AVMs treated with APC.  The colonoscopy identified AVMs to the ascending colon treated with APC.  His Hemoglobin stabilized without evidence of active GI bleeding.  Plavix and Eliquis were continued and he was discharged to a skilled nursing facility on 10/02/2020.   EGD 08/07/2020 by Dr. Rhea Bleacher at the [facility]: -Schatzki's ring -Duodenal AVM. Biopsies showed active duodenitis.   Colonoscopy 08/07/2020: -Cecal AMV-non-bleeding -Sigmoid diverticulosi -Internal hemorrhoids  EGD 09/30/2020: showed duodenal AVMs status post APC   Colonoscopy 09/30/2020: showed ascending colon AVM status post APC.  Colonoscopy 2001 in [place]: Reported as normal, no polyps per the patient.   CBC Latest Ref Rng & Units 03/27/2021 03/20/2021 03/13/2021  WBC 3.8 - 10.8 Thousand/uL 8.3 - 8.3  Hemoglobin 13.2 - 17.1 g/dL 11.1(L) 11.2(L) 11.9(L)  Hematocrit 38.5 - 50.0 % 34.4(L) 33.0(L) 35.8(L)  Platelets 140 - 400 Thousand/uL 298 - 312    CMP Latest Ref Rng & Units 04/21/2021 03/24/2021 03/20/2021  Glucose 65 - 99 mg/dL 118(H) 86 79  BUN 8 - 27 mg/dL 22 21 17   Creatinine 0.76 - 1.27 mg/dL 1.46(H) 1.71(H) 1.60(H)  Sodium 134 - 144 mmol/L 140 141 138  Potassium 3.5 - 5.2 mmol/L 4.6 5.1 4.3  Chloride 96 - 106 mmol/L 103 102 106  CO2 20 - 29 mmol/L 21 23 -  Calcium 8.6 - 10.2 mg/dL 9.4 9.5 -  Total Protein 6.0 - 8.5 g/dL - - -  Total Bilirubin 0.0 - 1.2 mg/dL - - -  Alkaline Phos 44 - 121 IU/L - - -  AST 0 - 40 IU/L - - -  ALT 0 - 44 IU/L - - -    CBC Latest Ref Rng & Units 03/27/2021 03/20/2021 03/13/2021  WBC 3.8 - 10.8 Thousand/uL 8.3 -  8.3  Hemoglobin 13.2 - 17.1 g/dL 11.1(L) 11.2(L) 11.9(L)  Hematocrit 38.5 - 50.0 % 34.4(L) 33.0(L) 35.8(L)  Platelets 140 - 400 Thousand/uL 298 - 312    03/13/2021: Iron 70. TIBC 290. Iron saturation 24. Ferritin 47.   CMP Latest Ref Rng & Units 04/21/2021 03/24/2021 03/20/2021  Glucose 65 - 99 mg/dL 118(H) 86 79  BUN 8 - 27 mg/dL 22 21 17   Creatinine 0.76 - 1.27 mg/dL 1.46(H) 1.71(H) 1.60(H)  Sodium 134 - 144 mmol/L 140 141 138  Potassium 3.5 - 5.2 mmol/L 4.6 5.1 4.3  Chloride 96 - 106 mmol/L 103 102 106  CO2 20 - 29 mmol/L 21 23 -  Calcium 8.6 - 10.2 mg/dL 9.4 9.5 -  Total Protein 6.0 - 8.5 g/dL - - -  Total Bilirubin 0.0 - 1.2 mg/dL - - -  Alkaline Phos 44 - 121 IU/L - - -  AST 0 - 40 IU/L - - -  ALT 0 - 44 IU/L - - -      Past Medical History:  Diagnosis Date   Acute anemia 09/26/2020   Anemia due to acute blood loss 05/25/2019   Chronic maxillary sinusitis 08/06/2017   Coronary artery disease due to lipid rich plaque 07/27/2019   Essential hypertension 02/14/2003   Fainting 05/30/2019   Fall 05/25/1999   High cholesterol 03/14/2001   History of basal cell carcinoma 11/02/2019   History of pneumonia    History of snoring    History of upper respiratory infection    Irregular heartbeat 08/06/2003   Ischemic cardiomyopathy 05/30/2019   Mild cognitive impairment 05/15/2019   NSTEMI (non-ST elevated myocardial infarction) ([place]) 06/03/2019   Poor renal function 03/19/2005   Primary osteoarthritis of shoulder 10/24/2015   Psoriasis 12/29/2004   Rash and nonspecific skin eruption 04/30/2020   Schatzki's ring 08/07/2020   Sepsis with acute hypoxic respiratory failure ([place]) 09/26/2020   Sleep apnea 08/13/2003   Past Surgical History:  Procedure Laterality Date   APPENDECTOMY     CATARACT EXTRACTION     COLONOSCOPY     HERNIA REPAIR     RIGHT HEART CATHETERIZATION WITH ADENOSINE STUDY     UPPER GI ENDOSCOPY     Social History: He is widowed.  He has 1 son and 1  daughter.  He is a retired Software engineer.  Past smoker, quit smoking in 1960. No alcohol use.  He resides in assisted living.  Family History: Daughter with celiac disease and breast cancer.  Son DM I. Brother brain cancer. Mother died age 44 lung disease. Father died 70 with history of 5 heart attacks.    Allergies  Allergen Reactions   Lisinopril Swelling   Sulfamethoxazole-Trimethoprim Other (See Comments)   Telmisartan Hives   Atorvastatin Rash   Sulfa Antibiotics Rash      Outpatient Encounter Medications as of 04/22/2021  Medication Sig   acetaminophen (TYLENOL) 500 MG tablet Take 500 mg by mouth 2 (two) times daily.   albuterol (PROVENTIL) (2.5 MG/3ML)  0.083% nebulizer solution Take 2.5 mg by nebulization every 6 (six) hours as needed for wheezing or shortness of breath.   apixaban (ELIQUIS) 5 MG TABS tablet Take 5 mg by mouth 2 (two) times daily.   aspirin EC 81 MG tablet Take 1 tablet (81 mg total) by mouth daily. Swallow whole.   benzonatate (TESSALON) 100 MG capsule Take 100 mg by mouth 3 (three) times daily as needed for cough.   CALCIUM CITRATE PO Take 1 tablet by mouth 2 (two) times daily.   empagliflozin (JARDIANCE) 10 MG TABS tablet Take 1 tablet (10 mg total) by mouth daily before breakfast.   ferrous sulfate 325 (65 FE) MG tablet Take 325 mg by mouth daily.   fluticasone (FLONASE) 50 MCG/ACT nasal spray Place 2 sprays into both nostrils 2 (two) times daily.   fluticasone furoate-vilanterol (BREO ELLIPTA) 100-25 MCG/INH AEPB Inhale 1 puff into the lungs daily.   furosemide (LASIX) 20 MG tablet Take 1 tablet (20 mg total) by mouth as needed (weight increase of 3 lbs in 1 day or 5 lbs in 1 week).   halobetasol (ULTRAVATE) 0.05 % cream Apply 1 application topically 3 (three) times a week. Spread topically to arms, thighs, and legs. Monday, Wednesday, and Friday.   halobetasol (ULTRAVATE) 0.05 % cream Apply 1 application topically every other day. Spread topically to back and  belt line.   ketoconazole (NIZORAL) 2 % cream Apply topically daily.   levocetirizine (XYZAL) 5 MG tablet Take 5 mg by mouth daily.   levothyroxine (SYNTHROID) 88 MCG tablet Take 1 tablet (88 mcg total) by mouth daily.   losartan (COZAAR) 50 MG tablet Take 50 mg by mouth in the morning.   Magnesium Hydroxide (DULCOLAX PO) Take by mouth as needed.   metoprolol succinate (TOPROL-XL) 50 MG 24 hr tablet Take 50 mg by mouth daily. Take with or immediately following a meal.   montelukast (SINGULAIR) 10 MG tablet Take 10 mg by mouth daily.   Multiple Vitamin (MULTIVITAMIN PO) Take 1 tablet by mouth daily.   pantoprazole (PROTONIX) 40 MG tablet Take 40 mg by mouth daily.   rosuvastatin (CRESTOR) 20 MG tablet Take 20 mg by mouth daily.   Saw Palmetto, Serenoa repens, (SAW PALMETTO FRUIT PO) Take 450 mg by mouth 2 (two) times daily.   senna-docusate (SENOKOT-S) 8.6-50 MG tablet Take 1 tablet by mouth 2 (two) times daily.   spironolactone (ALDACTONE) 25 MG tablet Take 0.5 tablets (12.5 mg total) by mouth daily.   triamcinolone cream (KENALOG) 0.1 % Apply 1 application topically as needed.   No facility-administered encounter medications on file as of 04/22/2021.    REVIEW OF SYSTEMS:  Gen: Denies fever, sweats or chills. No weight loss.  CV: Denies chest pain, palpitations or edema. Resp: Denies cough, shortness of breath of hemoptysis.  GI: See HPI. GU : Denies urinary burning, blood in urine, increased urinary frequency or incontinence. MS: Denies joint pain, muscles aches or weakness. Derm: Denies rash, itchiness, skin lesions or unhealing ulcers. Psych: Denies depression, anxiety or memory loss. Heme: Denies bruising, bleeding. Neuro:  Denies headaches, dizziness or paresthesias. Endo:  Denies any problems with DM, thyroid or adrenal function.   PHYSICAL EXAM: BP (!) 128/50 (BP Location: Right Arm, Patient Position: Sitting, Cuff Size: Normal)   Pulse 60   Ht 5\' 8"  (1.727 m)   Wt 163 lb  8 oz (74.2 kg)   SpO2 97%   BMI 24.86 kg/m   General: Pleasant 87 year old  male in no acute distress. Ambulating with the assistance of a walker.  Head: Normocephalic and atraumatic. Eyes:  Sclerae non-icteric, conjunctive pink. Ears: Normal auditory acuity. Mouth: Dentition intact. No ulcers or lesions.  Neck: Supple, no lymphadenopathy or thyromegaly.  Lungs: Clear bilaterally to auscultation without wheezes, crackles or rhonchi. Heart: Regular rate and rhythm. No murmur, rub or gallop appreciated.  Abdomen: Soft, nontender, non distended. No masses. No hepatosplenomegaly. Normoactive bowel sounds x 4 quadrants.  Rectal: Deferred.  Musculoskeletal: Symmetrical with no gross deformities. Skin: Warm and dry. No rash or lesions on visible extremities. Extremities: No edema. Neurological: Alert oriented x 4, no focal deficits.  Psychological:  Alert and cooperative. Normal mood and affect.  ASSESSMENT AND PLAN:  59. 87 year old male with IDA and macrocytic anemia, multifactorial: presumed chronic blood loss secondary to duodenal and colonic AMVs and renal insufficiency.  Hg level 11.1, HCT 34.4, MCV 104.9, Iron 70 and Ferritin 47 on 03/27/2021.  He has chronic solid/loose black stools since starting po iron. See EGD/colonoscopy 07/2020 and 09/2020 results above.  -Refer to hematology for anemia evaluation and IV iron infusions. Patient wishes to stop oral iron and transition to IV iron if appropriate. -CBC, B12 and folate level.  -No plans for repeat endoscopic evaluation in setting of known duodenal and colonic AVMs unless he develops overt GI bleeding.  -Monitor H/H Q 3 months. -Follow up in office in office in 2 months   2. Atrial fibrillation  CHA2DS2-VASc score 5 (CHF, hypertension, age x2, CAD) on Eliquis  3.  History of CAD, past coronary stents, NSTEMI 09/2020 no longer on Plavix   4.  Renal insufficiency. Cr level 1.71.  -Recommend follow-up with PCP regarding elevated  creatinine level, consider nephrology consult  5. Constipation -MiraLAX 1 capful mixed in 8 ounces of cranberry juice daily. -Okay to continue senna 1 tab p.o. twice daily if needed, discontinue if stools become too loose -Dulcolax 2-3 tabs every third night as needed  Addendum: Labs 04/22/2021: Hg improved at 12.5. Normal tTG levels. Vitamin b12 level 450.   CC:  [name], NP

## 2021-04-22 NOTE — Patient Instructions (Addendum)
LABS:  Lab work has been ordered for you today. Our lab is located in the basement. Press "B" on the elevator. The lab is located at the first door on the left as you exit the elevator.  HEALTHCARE LAWS AND MY CHART RESULTS: Due to recent changes in healthcare laws, you may see the results of your imaging and laboratory studies on MyChart before your provider has had a chance to review them.   We understand that in some cases there may be results that are confusing or concerning to you. Not all laboratory results come back in the same time frame and the provider may be waiting for multiple results in order to interpret others.  Please give [place] 48 hours in order for your provider to thoroughly review all the results before contacting the office for clarification of your results.   RECOMMENDATIONS: Miralax- Dissolve one capful in 8 ounces of cranberry juice and drink once daily to help with constipation. Continue Senna 8.5 mg, take 1 twice a day but stop if diarrhea develops. Follow up with your primary care provider regarding your elevated creatinine levels, you may need a nephrology evaluation. Please follow up with Dr. Loletha Carrow in 2 months.  We place placed a referral to Hematology.  You should hear from their office in the next couple weeks to schedule an appointment.   It was great seeing you today! Thank you for entrusting me with your care and choosing [facility].  Noralyn Pick, CRNP  The [facility] providers would like to encourage you to use [facility] to communicate with providers for non-urgent requests or questions.  Due to long hold times on the telephone, sending your provider a message by [facility] may be faster and more efficient way to get a response. Please allow 48 business hours for a response.  Please remember that this is for non-urgent requests/questions.  If you are age 34 or older, your body mass index should be between 23-30. Your Body mass index is 24.86  kg/m. If this is out of the aforementioned range listed, please consider follow up with your Primary Care Provider.  If you are age 93 or younger, your body mass index should be between 19-25. Your Body mass index is 24.86 kg/m. If this is out of the aformentioned range listed, please consider follow up with your Primary Care Provider.

## 2021-04-23 LAB — TISSUE TRANSGLUTAMINASE ABS,IGG,IGA
(tTG) Ab, IgA: <1 U/mL
(tTG) Ab, IgG: <1 U/mL

## 2021-04-23 LAB — IGA: Immunoglobulin A: 235 mg/dL (ref 70–320)

## 2021-04-23 NOTE — Progress Notes (Signed)
____________________________________________________________  Attending physician addendum:  Thank you for sending this case to me. I have reviewed the entire note and agree with the plan.  I agree entirely that periodic IV iron under the direction of Hematology would be preferable, given the GI intolerance of oral iron he reports. His age and medical condition warrant a conservative approach regarding endoscopic procedures.  Most likely would only pursue if overt GI bleeding or worsening IDA despite adequate IV iron treatments.  Wilfrid Lund, MD  ____________________________________________________________

## 2021-05-09 ENCOUNTER — Telehealth: Payer: Self-pay

## 2021-05-09 ENCOUNTER — Encounter: Payer: Self-pay | Admitting: Orthopedic Surgery

## 2021-05-09 NOTE — Telephone Encounter (Signed)
We received a fax from [facility] [place] to clarify the order for his dulcolax: Take 2 tablets by mouth every 3rd night as needed for constipation. Form signed by Carl Best, CRNP and faxed to [phone]. We got confirmation that it went thru. Their phone # is [phone].

## 2021-05-21 ENCOUNTER — Telehealth: Payer: Self-pay | Admitting: Hematology and Oncology

## 2021-05-21 NOTE — Telephone Encounter (Signed)
Received a new hem referral from [facility] for IDA. I received a call from the pt's daughter to schedule a hematology appt on 8/25 at 10am to see Dr. Chryl Heck. Aware to arrive 20 minutes early.  Referral was created on 7/12 by the referring office and was never placed into [place].

## 2021-06-05 ENCOUNTER — Inpatient Hospital Stay: Payer: Medicare Other

## 2021-06-05 ENCOUNTER — Encounter: Payer: Self-pay | Admitting: Hematology and Oncology

## 2021-06-05 ENCOUNTER — Other Ambulatory Visit: Payer: Self-pay

## 2021-06-05 ENCOUNTER — Inpatient Hospital Stay: Payer: Medicare Other | Attending: Hematology and Oncology | Admitting: Hematology and Oncology

## 2021-06-05 VITALS — BP 143/44 | HR 76 | Temp 97.6°F | Resp 18 | Ht 68.0 in | Wt 168.3 lb

## 2021-06-05 DIAGNOSIS — I10 Essential (primary) hypertension: Secondary | ICD-10-CM | POA: Diagnosis not present

## 2021-06-05 DIAGNOSIS — I4891 Unspecified atrial fibrillation: Secondary | ICD-10-CM | POA: Insufficient documentation

## 2021-06-05 DIAGNOSIS — I2510 Atherosclerotic heart disease of native coronary artery without angina pectoris: Secondary | ICD-10-CM | POA: Diagnosis not present

## 2021-06-05 DIAGNOSIS — D509 Iron deficiency anemia, unspecified: Secondary | ICD-10-CM | POA: Diagnosis present

## 2021-06-05 DIAGNOSIS — D500 Iron deficiency anemia secondary to blood loss (chronic): Secondary | ICD-10-CM

## 2021-06-05 LAB — CBC WITH DIFFERENTIAL/PLATELET
Abs Immature Granulocytes: 0.02 10*3/uL (ref 0.00–0.07)
Basophils Absolute: 0.1 10*3/uL (ref 0.0–0.1)
Basophils Relative: 1 %
Eosinophils Absolute: 0.4 10*3/uL (ref 0.0–0.5)
Eosinophils Relative: 5 %
HCT: 36.3 % — ABNORMAL LOW (ref 39.0–52.0)
Hemoglobin: 11.9 g/dL — ABNORMAL LOW (ref 13.0–17.0)
Immature Granulocytes: 0 %
Lymphocytes Relative: 18 %
Lymphs Abs: 1.5 10*3/uL (ref 0.7–4.0)
MCH: 34.2 pg — ABNORMAL HIGH (ref 26.0–34.0)
MCHC: 32.8 g/dL (ref 30.0–36.0)
MCV: 104.3 fL — ABNORMAL HIGH (ref 80.0–100.0)
Monocytes Absolute: 0.9 10*3/uL (ref 0.1–1.0)
Monocytes Relative: 10 %
Neutro Abs: 5.8 10*3/uL (ref 1.7–7.7)
Neutrophils Relative %: 66 %
Platelets: 297 10*3/uL (ref 150–400)
RBC: 3.48 MIL/uL — ABNORMAL LOW (ref 4.22–5.81)
RDW: 14.5 % (ref 11.5–15.5)
WBC: 8.7 10*3/uL (ref 4.0–10.5)
nRBC: 0 % (ref 0.0–0.2)

## 2021-06-05 LAB — FERRITIN: Ferritin: 29 ng/mL (ref 24–336)

## 2021-06-05 LAB — IRON AND TIBC
Iron: 128 ug/dL (ref 42–163)
Saturation Ratios: 40 % (ref 20–55)
TIBC: 319 ug/dL (ref 202–409)
UIBC: 191 ug/dL (ref 117–376)

## 2021-06-05 NOTE — Progress Notes (Signed)
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 fib and chronic anticoagulation who arrived with his daughter today for recommendations regarding iron deficiency anemia.  Patient has been able to tolerate oral iron supplementation since he has started taking some MiraLAX.  Prior to that he was having a lot of issues with constipation. Today he denies any complaints at all. Physical examination appropriate for an 87 year old man, scattered skin bruising noted from thinning of skin and concurrent anticoagulation.  No palpable lymphadenopathy or hepatosplenomegaly. Have reviewed his labs from today which showed a slight drop in hemoglobin and ferritin compared to his last labs in June and July 2022.  He currently wants to continue oral iron supplementation wants to return to clinic in a few months for repeat labs and do intravenous iron if absolutely needed.  He is currently not excited about having to take any intravenous iron since he feels well.  I agree with this approach.  I will reschedule his follow-up in about 8 weeks with repeat labs to review any additional recommendations.  It appears that the source of iron deficiency anemia is intermittent bleeding arteriovenous malformations however gastroenterology team believes that given his age its not a very good idea to keep repeating procedures and recommendation was to proceed with supportive care. I called his daughter  and relayed the results.  HISTORY OF PRESENTING ILLNESS:   Ronnie Joseph 87 y.o. male is here because of IDA  This is a very pleasant 87 year old male patient with CAD, ischemic cardiomyopathy, hypertension, intermittent bleeding from AVM, atrial fibrillation on chronic anticoagulation who arrived with his daughter today for evaluation and recommendations regarding iron deficiency anemia.  Ronnie Joseph denies any complaints at all.  He lives in a retirement facility, feels pretty well, may have lost some weight over the past year of about 10 pounds.  He denies any major changes in his appetite, energy.  No fevers or drenching night sweats.  He has been able to tolerate the oral iron since he started taking MiraLAX.  Prior to that he was having a lot of constipation with oral iron.  His gastroenterologist thought he could benefit from IV iron while he was having that intolerance to oral iron.  He denies any bleeding in his stool, hematochezia or melena although he has always had black stool.  He tries to walk around in the retirement facility but he is not very active otherwise.  He watches TV most of the day.  He is a retired Software engineer.  He never needed blood transfusion or iron infusion.  Rest of the pertinent 10 point ROS reviewed and negative.  He is originally from [facility] and has moved to [place] to stay closer to family.   MEDICAL HISTORY:  Past Medical History:  Diagnosis Date   Acute anemia 09/26/2020   Anemia due to acute blood loss 05/25/2019   Chronic maxillary sinusitis 08/06/2017   Coronary artery disease due to lipid rich plaque 07/27/2019   Essential hypertension 02/14/2003   Fainting 05/30/2019   Fall 05/25/1999  High cholesterol 03/14/2001   History of basal cell carcinoma 11/02/2019   History of pneumonia    History of snoring    History of upper respiratory infection    Irregular heartbeat 08/06/2003   Ischemic cardiomyopathy 05/30/2019   Mild cognitive impairment  05/15/2019   NSTEMI (non-ST elevated myocardial infarction) ([place]) 06/03/2019   Poor renal function 03/19/2005   Primary osteoarthritis of shoulder 10/24/2015   Psoriasis 12/29/2004   Rash and nonspecific skin eruption 04/30/2020   Schatzki's ring 08/07/2020   Sepsis with acute hypoxic respiratory failure ([place]) 09/26/2020   Sleep apnea 08/13/2003    SURGICAL HISTORY: Past Surgical History:  Procedure Laterality Date   APPENDECTOMY     CATARACT EXTRACTION     COLONOSCOPY     HERNIA REPAIR     RIGHT HEART CATHETERIZATION WITH ADENOSINE STUDY     UPPER GI ENDOSCOPY      SOCIAL HISTORY: Social History   Socioeconomic History   Marital status: Widowed    Spouse name: Not on file   Number of children: Not on file   Years of education: Not on file   Highest education level: Not on file  Occupational History   Not on file  Tobacco Use   Smoking status: Former    Packs/day: 0.25    Types: Cigarettes    Quit date: 12/17/1958    Years since quitting: 62.5   Smokeless tobacco: Never  Vaping Use   Vaping Use: Never used  Substance and Sexual Activity   Alcohol use: Yes    Alcohol/week: 3.0 standard drinks    Types: 3 Standard drinks or equivalent per week   Drug use: Never   Sexual activity: Not Currently  Other Topics Concern   Not on file  Social History Narrative   Tobacco use, amount per day now: 0   Past tobacco use, amount per day: Less than 1 pack   How many years did you use tobacco: 5 years, stopped in 1960   Alcohol use (drinks per week): 4   Diet: N/A   Do you drink/eat things with caffeine: Yes.   Marital status: Widowed                                  What year were you married? 1960   Do you live in a house, apartment, assisted living, condo, trailer, etc.? Assisted Living.   Is it one or more stories? 1    How many persons live in your home? 1   Do you have pets in your home?( please list) No.   Highest Level of education completed? College   Current or  past profession: Pharmacist   Do you exercise?  No.                                Type and how often?   Do you have a living will? Yes.   Do you have a DNR form?  Yes.                                 If not, do you want to discuss one?   Do you have signed POA/HPOA forms? Yes.  If so, please bring to you appointment      Do you have any difficulty bathing or dressing yourself? Yes.   Do you have any difficulty preparing food or eating? Yes.   Do you have any difficulty managing your medications? Yes.   Do you have any difficulty managing your finances? No.   Do you have any difficulty affording your medications? No.   Social Determinants of Health   Financial Resource Strain: Not on file  Food Insecurity: Not on file  Transportation Needs: Not on file  Physical Activity: Not on file  Stress: Not on file  Social Connections: Not on file  Intimate Partner Violence: Not on file    FAMILY HISTORY: Family History  Problem Relation Age of Onset   Lung disease Mother    Heart disease Father    Heart attack Father    Brain cancer Brother    Breast cancer Daughter    Celiac disease Daughter    Diabetes Son    Diabetes Mellitus I Son    Stomach cancer Other    Pancreatic cancer Other    Esophageal cancer Other    Colon polyps Other    Colon cancer Other     ALLERGIES:  is allergic to lisinopril, sulfamethoxazole-trimethoprim, telmisartan, atorvastatin, and sulfa antibiotics.  MEDICATIONS:  Current Outpatient Medications  Medication Sig Dispense Refill   acetaminophen (TYLENOL) 500 MG tablet Take 500 mg by mouth 2 (two) times daily.     albuterol (PROVENTIL) (2.5 MG/3ML) 0.083% nebulizer solution Take 2.5 mg by nebulization every 6 (six) hours as needed for wheezing or shortness of breath.     amiodarone (PACERONE) 200 MG tablet Take 200 mg by mouth daily.     apixaban (ELIQUIS) 5 MG TABS tablet Take 5 mg by mouth 2 (two) times daily.     aspirin EC 81 MG  tablet Take 1 tablet (81 mg total) by mouth daily. Swallow whole. 30 tablet 11   benzonatate (TESSALON) 100 MG capsule Take 100 mg by mouth 3 (three) times daily as needed for cough.     CALCIUM CITRATE PO Take 1 tablet by mouth 2 (two) times daily.     empagliflozin (JARDIANCE) 10 MG TABS tablet Take 1 tablet (10 mg total) by mouth daily before breakfast. 90 tablet 1   ferrous sulfate 325 (65 FE) MG tablet Take 325 mg by mouth daily.     fluticasone (FLONASE) 50 MCG/ACT nasal spray Place 2 sprays into both nostrils 2 (two) times daily.     fluticasone furoate-vilanterol (BREO ELLIPTA) 100-25 MCG/INH AEPB Inhale 1 puff into the lungs daily.     furosemide (LASIX) 20 MG tablet Take 1 tablet (20 mg total) by mouth as needed (weight increase of 3 lbs in 1 day or 5 lbs in 1 week). 30 tablet 3   halobetasol (ULTRAVATE) 0.05 % cream Apply 1 application topically 3 (three) times a week. Spread topically to arms, thighs, and legs. Monday, Wednesday, and Friday.     levocetirizine (XYZAL) 5 MG tablet Take 5 mg by mouth daily.     levothyroxine (SYNTHROID) 88 MCG tablet Take 1 tablet (88 mcg total) by mouth daily. 90 tablet 3   losartan (COZAAR) 50 MG tablet Take 50 mg by mouth in the morning.     Magnesium Hydroxide (DULCOLAX PO) Take by mouth as needed.     metoprolol succinate (TOPROL-XL) 50 MG 24 hr tablet Take 50 mg by mouth daily. Take with or immediately following a  meal.     montelukast (SINGULAIR) 10 MG tablet Take 10 mg by mouth daily.     Multiple Vitamin (MULTIVITAMIN PO) Take 1 tablet by mouth daily.     pantoprazole (PROTONIX) 40 MG tablet Take 40 mg by mouth daily.     polyethylene glycol (MIRALAX / GLYCOLAX) 17 g packet Take 17 g by mouth daily as needed.     rosuvastatin (CRESTOR) 20 MG tablet Take 20 mg by mouth daily.     Saw Palmetto, Serenoa repens, (SAW PALMETTO FRUIT PO) Take 450 mg by mouth 2 (two) times daily.     senna-docusate (SENOKOT-S) 8.6-50 MG tablet Take 1 tablet by mouth 2  (two) times daily.     spironolactone (ALDACTONE) 25 MG tablet Take 0.5 tablets (12.5 mg total) by mouth daily. 45 tablet 1   triamcinolone cream (KENALOG) 0.1 % Apply 1 application topically as needed.     halobetasol (ULTRAVATE) 0.05 % cream Apply 1 application topically every other day. Spread topically to back and belt line.     ketoconazole (NIZORAL) 2 % cream Apply 1 application topically daily as needed for irritation.     No current facility-administered medications for this visit.    PHYSICAL EXAMINATION: ECOG PERFORMANCE STATUS: 0 - Asymptomatic  Vitals:   06/05/21 1010  BP: (!) 143/44  Pulse: 76  Resp: 18  Temp: 97.6 F (36.4 C)  SpO2: 100%   Filed Weights   06/05/21 1010  Weight: 168 lb 4.8 oz (76.3 kg)    GENERAL:alert, no distress and comfortable, he needed assistance getting up from the chair to the table.  He walks with a walker. SKIN:  Multiple bruises all over his forearms likely from thinning of skin and anticoagulation on board. EYES: normal, conjunctiva are pink and non-injected, sclera clear OROPHARYNX:no exudate, no erythema and lips, buccal mucosa, and tongue normal  NECK: supple, thyroid normal size, non-tender, without nodularity LYMPH:  no palpable lymphadenopathy in the cervical, axillary LUNGS: clear to auscultation and percussion with normal breathing effort HEART: regular rate & rhythm and no murmurs and no lower extremity edema ABDOMEN:abdomen soft, non-tender and normal bowel sounds.  No splenomegaly Musculoskeletal: Mild clubbing noted. PSYCH: alert & oriented x 3 with fluent speech  LABORATORY DATA:  I have reviewed the data as listed Lab Results  Component Value Date   WBC 9.7 04/22/2021   HGB 12.5 (L) 04/22/2021   HCT 37.2 (L) 04/22/2021   MCV 102.3 (H) 04/22/2021   PLT 343.0 04/22/2021     Chemistry      Component Value Date/Time   NA 140 04/21/2021 0941   K 4.6 04/21/2021 0941   CL 103 04/21/2021 0941   CO2 21 04/21/2021  0941   BUN 22 04/21/2021 0941   CREATININE 1.46 (H) 04/21/2021 0941   CREATININE 1.37 (H) 12/10/2020 0000      Component Value Date/Time   CALCIUM 9.4 04/21/2021 0941   ALKPHOS 47 03/13/2021 1118   AST 16 03/13/2021 1118   ALT 11 03/13/2021 1118   BILITOT 0.3 03/13/2021 1118      Have reviewed his labs from June and July.  We have discussed that he does have a mild anemia and his last iron panel and ferritin from June was within normal limits.  We elected to repeat labs today. CBC showed a white count of 8700, hemoglobin of 11.9, platelet count of 297,000. TIBC completely normal ferritin has dropped from 47-29 but still within normal limits.  RADIOGRAPHIC STUDIES: I  have personally reviewed the radiological images as listed and agreed with the findings in the report. No results found.  All questions were answered. The patient knows to call the clinic with any problems, questions or concerns.  I spent 45 minutes in the care of this patient including H and P, review of records, counseling and coordination of care.     Benay Pike, Ronnie 06/05/2021 10:20 AM

## 2021-06-06 ENCOUNTER — Telehealth: Payer: Self-pay | Admitting: Hematology and Oncology

## 2021-06-06 NOTE — Telephone Encounter (Signed)
Scheduled appt per 8/25 sch msg. Called pt, no answer. Left msg with appt date and time.

## 2021-06-12 ENCOUNTER — Ambulatory Visit (INDEPENDENT_AMBULATORY_CARE_PROVIDER_SITE_OTHER): Payer: Medicare Other | Admitting: Orthopedic Surgery

## 2021-06-12 ENCOUNTER — Other Ambulatory Visit: Payer: Self-pay

## 2021-06-12 ENCOUNTER — Encounter: Payer: Self-pay | Admitting: Orthopedic Surgery

## 2021-06-12 VITALS — BP 138/60 | HR 48 | Temp 97.1°F | Resp 16 | Ht 68.0 in | Wt 168.4 lb

## 2021-06-12 DIAGNOSIS — I5042 Chronic combined systolic (congestive) and diastolic (congestive) heart failure: Secondary | ICD-10-CM

## 2021-06-12 DIAGNOSIS — G3184 Mild cognitive impairment, so stated: Secondary | ICD-10-CM

## 2021-06-12 DIAGNOSIS — K5901 Slow transit constipation: Secondary | ICD-10-CM

## 2021-06-12 DIAGNOSIS — N1831 Chronic kidney disease, stage 3a: Secondary | ICD-10-CM

## 2021-06-12 DIAGNOSIS — I10 Essential (primary) hypertension: Secondary | ICD-10-CM

## 2021-06-12 DIAGNOSIS — R001 Bradycardia, unspecified: Secondary | ICD-10-CM

## 2021-06-12 DIAGNOSIS — D692 Other nonthrombocytopenic purpura: Secondary | ICD-10-CM

## 2021-06-12 DIAGNOSIS — L853 Xerosis cutis: Secondary | ICD-10-CM

## 2021-06-12 DIAGNOSIS — Z23 Encounter for immunization: Secondary | ICD-10-CM | POA: Diagnosis not present

## 2021-06-12 DIAGNOSIS — R2242 Localized swelling, mass and lump, left lower limb: Secondary | ICD-10-CM

## 2021-06-12 DIAGNOSIS — I2510 Atherosclerotic heart disease of native coronary artery without angina pectoris: Secondary | ICD-10-CM

## 2021-06-12 DIAGNOSIS — I2583 Coronary atherosclerosis due to lipid rich plaque: Secondary | ICD-10-CM

## 2021-06-12 DIAGNOSIS — E039 Hypothyroidism, unspecified: Secondary | ICD-10-CM

## 2021-06-12 DIAGNOSIS — I480 Paroxysmal atrial fibrillation: Secondary | ICD-10-CM

## 2021-06-12 LAB — BASIC METABOLIC PANEL
BUN/Creatinine Ratio: 20 (calc) (ref 6–22)
BUN: 31 mg/dL — ABNORMAL HIGH (ref 7–25)
CO2: 24 mmol/L (ref 20–32)
Calcium: 9.7 mg/dL (ref 8.6–10.3)
Chloride: 105 mmol/L (ref 98–110)
Creat: 1.54 mg/dL — ABNORMAL HIGH (ref 0.70–1.22)
Glucose, Bld: 101 mg/dL — ABNORMAL HIGH (ref 65–99)
Potassium: 5 mmol/L (ref 3.5–5.3)
Sodium: 139 mmol/L (ref 135–146)

## 2021-06-12 NOTE — Patient Instructions (Addendum)
Will discuss EKG with cardiology.   Please notify staff if you experience any dizziness or light headedness.   Try Hulan Fray or Johnson baby soap to prevent skin dryness.

## 2021-06-12 NOTE — Progress Notes (Signed)
Careteam: Patient Care Team: [name], NP as PCP - General (Adult Health Nurse Practitioner) Vickie Epley, MD as PCP - Electrophysiology (Cardiology) Jari Pigg, MD as Consulting Physician (Dermatology) Donato Heinz, MD as Consulting Physician (Cardiology) [name], NP (Adult Health Nurse Practitioner)  Seen by: Windell Moulding, AGNP-C  PLACE OF SERVICE:  [place] Directive information Does Patient Have a Medical Advance Directive?: Yes, Type of Advance Directive: [place], Does patient want to make changes to medical advance directive?: No - Patient declined  Allergies  Allergen Reactions   Lisinopril Swelling   Sulfamethoxazole-Trimethoprim Other (See Comments)   Telmisartan Hives   Atorvastatin Rash   Sulfa Antibiotics Rash    Chief Complaint  Patient presents with   Medical Management of Chronic Issues    6 month follow up.   Immunizations    Discuss the need for tetanus vaccine, shingrix vaccine, 2nd covid bootser, and influenza vaccine.      HPI: Patient is a 87 y.o. male seen today for medical management of chronic conditions.   Would like flu vaccine today.   Heart rate in 40's today. Denies feeling light headed or dizzy. Followed by cardiology. Continues to take amiodarone, losartan and metoprolol.   Psoriasis- followed by Dr. Delman Cheadle dermatology, triamcinolone cream started, he did not tolerate Cerve SA. Still complains of skin dryness. Showers once weekly. Uses Dial soap.Continues to take xyzal daily for allergies and itching.     Skin tear right forearm. He accidentally bumped his hand on a railing.   He is slowly noticing purple sports on his forearms and legs. Denies pain or trauma to extremities besides skin tear.   Left ankle nodule unchanged. Onset about 6 months ago. Denies pain or decreased ROM. Requesting x-ray.   Reports dry cough. Intermittent, occurs more in the morning when he wakes up.  Denies sputum production. Continues to use tessalon pearls prn.   No recent falls, continues to ambulate with rollator.      Review of Systems:  Review of Systems  Constitutional:  Negative for chills, fever, malaise/fatigue and weight loss.  HENT:  Negative for hearing loss and sore throat.   Eyes:  Negative for blurred vision and double vision.  Respiratory:  Positive for cough. Negative for sputum production, shortness of breath and wheezing.   Cardiovascular:  Negative for chest pain and leg swelling.  Gastrointestinal:  Negative for abdominal pain, blood in stool, constipation, diarrhea, heartburn, nausea and vomiting.  Genitourinary:  Negative for dysuria, frequency and hematuria.  Musculoskeletal:  Positive for joint pain and myalgias. Negative for falls.  Skin:  Positive for itching.  Neurological:  Positive for weakness. Negative for dizziness and headaches.  Psychiatric/Behavioral:  Negative for depression and memory loss. The patient is not nervous/anxious and does not have insomnia.    Past Medical History:  Diagnosis Date   Acute anemia 09/26/2020   Anemia due to acute blood loss 05/25/2019   Chronic maxillary sinusitis 08/06/2017   Coronary artery disease due to lipid rich plaque 07/27/2019   Essential hypertension 02/14/2003   Fainting 05/30/2019   Fall 05/25/1999   High cholesterol 03/14/2001   History of basal cell carcinoma 11/02/2019   History of pneumonia    History of snoring    History of upper respiratory infection    Irregular heartbeat 08/06/2003   Ischemic cardiomyopathy 05/30/2019   Mild cognitive impairment 05/15/2019   NSTEMI (non-ST elevated myocardial infarction) ([place]) 06/03/2019  Poor renal function 03/19/2005   Primary osteoarthritis of shoulder 10/24/2015   Psoriasis 12/29/2004   Rash and nonspecific skin eruption 04/30/2020   Schatzki's ring 08/07/2020   Sepsis with acute hypoxic respiratory failure ([place]) 09/26/2020   Sleep apnea  08/13/2003   Past Surgical History:  Procedure Laterality Date   APPENDECTOMY     CATARACT EXTRACTION     COLONOSCOPY     HERNIA REPAIR     RIGHT HEART CATHETERIZATION WITH ADENOSINE STUDY     UPPER GI ENDOSCOPY     Social History:   reports that he quit smoking about 62 years ago. His smoking use included cigarettes. He smoked an average of .25 packs per day. He has never used smokeless tobacco. He reports current alcohol use of about 3.0 standard drinks per week. He reports that he does not use drugs.  Family History  Problem Relation Age of Onset   Lung disease Mother    Heart disease Father    Heart attack Father    Brain cancer Brother    Breast cancer Daughter    Celiac disease Daughter    Diabetes Son    Diabetes Mellitus I Son    Stomach cancer Other    Pancreatic cancer Other    Esophageal cancer Other    Colon polyps Other    Colon cancer Other     Medications: Patient's Medications  New Prescriptions   No medications on file  Previous Medications   ACETAMINOPHEN (TYLENOL) 500 MG TABLET    Take 500 mg by mouth 2 (two) times daily.   ALBUTEROL (PROVENTIL) (2.5 MG/3ML) 0.083% NEBULIZER SOLUTION    Take 2.5 mg by nebulization every 6 (six) hours as needed for wheezing or shortness of breath.   AMIODARONE (PACERONE) 200 MG TABLET    Take 200 mg by mouth daily.   APIXABAN (ELIQUIS) 5 MG TABS TABLET    Take 5 mg by mouth 2 (two) times daily.   ASPIRIN EC 81 MG TABLET    Take 1 tablet (81 mg total) by mouth daily. Swallow whole.   BENZONATATE (TESSALON) 100 MG CAPSULE    Take 100 mg by mouth 3 (three) times daily as needed for cough.   BISACODYL (DULCOLAX) 5 MG EC TABLET    Take 5 mg by mouth daily as needed for moderate constipation.   CALCIUM CITRATE PO    Take 1 tablet by mouth 2 (two) times daily.   EMOLLIENT (CERAVE SA RENEWING) CREA    Apply 1 application topically daily. Apply to pruritic area including neck, beltline, and upper chest.   EMPAGLIFLOZIN  (JARDIANCE) 10 MG TABS TABLET    Take 1 tablet (10 mg total) by mouth daily before breakfast.   FERROUS SULFATE 325 (65 FE) MG TABLET    Take 325 mg by mouth daily.   FLUTICASONE (FLONASE) 50 MCG/ACT NASAL SPRAY    Place 2 sprays into both nostrils 2 (two) times daily.   FLUTICASONE FUROATE-VILANTEROL (BREO ELLIPTA) 100-25 MCG/INH AEPB    Inhale 1 puff into the lungs daily.   FUROSEMIDE (LASIX) 20 MG TABLET    Take 1 tablet (20 mg total) by mouth as needed (weight increase of 3 lbs in 1 day or 5 lbs in 1 week).   KETOCONAZOLE (NIZORAL) 2 % CREAM    Apply 1 application topically daily as needed for irritation.   LEVOCETIRIZINE (XYZAL) 5 MG TABLET    Take 5 mg by mouth daily.   LEVOTHYROXINE (SYNTHROID) 88 MCG  TABLET    Take 1 tablet (88 mcg total) by mouth daily.   LOSARTAN (COZAAR) 50 MG TABLET    Take 50 mg by mouth in the morning.   METOPROLOL SUCCINATE (TOPROL-XL) 50 MG 24 HR TABLET    Take 50 mg by mouth daily. Take with or immediately following a meal.   MONTELUKAST (SINGULAIR) 10 MG TABLET    Take 10 mg by mouth daily.   MULTIPLE VITAMIN (MULTIVITAMIN PO)    Take 1 tablet by mouth daily.   PANTOPRAZOLE (PROTONIX) 40 MG TABLET    Take 40 mg by mouth daily.   POLYETHYLENE GLYCOL (MIRALAX / GLYCOLAX) 17 G PACKET    Take 17 g by mouth daily as needed.   ROSUVASTATIN (CRESTOR) 20 MG TABLET    Take 20 mg by mouth daily.   SAW PALMETTO, SERENOA REPENS, (SAW PALMETTO FRUIT PO)    Take 450 mg by mouth 2 (two) times daily.   SENNA-DOCUSATE (SENOKOT-S) 8.6-50 MG TABLET    Take 1 tablet by mouth 2 (two) times daily.   SPIRONOLACTONE (ALDACTONE) 25 MG TABLET    Take 0.5 tablets (12.5 mg total) by mouth daily.  Modified Medications   No medications on file  Discontinued Medications   HALOBETASOL (ULTRAVATE) 0.05 % CREAM    Apply 1 application topically 3 (three) times a week. Spread topically to arms, thighs, and legs. Monday, Wednesday, and Friday.   HALOBETASOL (ULTRAVATE) 0.05 % CREAM    Apply 1  application topically every other day. Spread topically to back and belt line.   MAGNESIUM HYDROXIDE (DULCOLAX PO)    Take by mouth as needed.   TRIAMCINOLONE CREAM (KENALOG) 0.1 %    Apply 1 application topically as needed.    Physical Exam:  Vitals:   06/12/21 1451  BP: 138/60  Pulse: (!) 48  Resp: 16  Temp: (!) 97.1 F (36.2 C)  SpO2: 96%  Weight: 168 lb 6.4 oz (76.4 kg)  Height: '5\' 8"'$  (1.727 m)   Body mass index is 25.61 kg/m. Wt Readings from Last 3 Encounters:  06/12/21 168 lb 6.4 oz (76.4 kg)  06/05/21 168 lb 4.8 oz (76.3 kg)  04/22/21 163 lb 8 oz (74.2 kg)    Physical Exam Vitals reviewed.  Constitutional:      General: He is not in acute distress. HENT:     Head: Normocephalic.  Eyes:     General:        Right eye: No discharge.        Left eye: No discharge.  Neck:     Vascular: No carotid bruit.  Cardiovascular:     Rate and Rhythm: Normal rate. Rhythm irregular.     Pulses: Normal pulses.     Heart sounds: Normal heart sounds. No murmur heard. Pulmonary:     Effort: Pulmonary effort is normal. No respiratory distress.     Breath sounds: Normal breath sounds. No wheezing.  Abdominal:     General: Bowel sounds are normal. There is no distension.     Palpations: Abdomen is soft.     Tenderness: There is no abdominal tenderness.  Musculoskeletal:     Cervical back: Normal range of motion.     Right lower leg: No edema.     Left lower leg: No edema.     Left ankle: No swelling. No tenderness. Normal range of motion.     Comments: Pea-sized, fixed, firm nodule felt over left ankle. No tenderness noted on exam, FROM.  Lymphadenopathy:     Cervical: No cervical adenopathy.  Skin:    General: Skin is warm and dry.     Capillary Refill: Capillary refill takes less than 2 seconds.     Coloration: Skin is not jaundiced.     Findings: No bruising.     Comments: Nickel sized skin tear to right dorsal hand CDI, drainage serosanguinous, surrounding skin  intact. Senile purpura scattered over all extremities.   Neurological:     General: No focal deficit present.     Mental Status: He is alert and oriented to person, place, and time.     Motor: Weakness present.     Gait: Gait abnormal.     Comments: rollator  Psychiatric:        Mood and Affect: Mood normal.        Behavior: Behavior normal.    Labs reviewed: Basic Metabolic Panel: Recent Labs    12/10/20 0000 01/10/21 0946 01/17/21 1444 03/13/21 1118 03/20/21 0559 03/24/21 1108 04/21/21 0941  NA 145 142   < > 143 138 141 140  K 4.7 5.4*   < > 4.8 4.3 5.1 4.6  CL 107 102   < > 102 106 102 103  CO2 29 23   < > 24  --  23 21  GLUCOSE 91 124*   < > 124* 79 86 118*  BUN 15 22   < > '18 17 21 22  '$ CREATININE 1.37* 1.62*   < > 1.58* 1.60* 1.71* 1.46*  CALCIUM 9.6 10.0   < > 9.7  --  9.5 9.4  MG  --  2.3  --   --   --   --   --   TSH 9.96* 4.000  --  1.880  --   --   --    < > = values in this interval not displayed.   Liver Function Tests: Recent Labs    11/12/20 1629 11/26/20 1550 03/13/21 1118  AST '16 22 16  '$ ALT '13 17 11  '$ ALKPHOS  --  44 47  BILITOT 0.4 0.2 0.3  PROT 6.8 6.7 6.8  ALBUMIN  --  4.4 4.3   No results for input(s): LIPASE, AMYLASE in the last 8760 hours. No results for input(s): AMMONIA in the last 8760 hours. CBC: Recent Labs    12/10/20 0000 03/13/21 1118 03/27/21 1037 04/22/21 1046 06/05/21 1105  WBC 8.0   < > 8.3 9.7 8.7  NEUTROABS 4,992  --  5,710  --  5.8  HGB 11.1*   < > 11.1* 12.5* 11.9*  HCT 33.4*   < > 34.4* 37.2* 36.3*  MCV 101.8*   < > 104.9* 102.3* 104.3*  PLT 332   < > 298 343.0 297   < > = values in this interval not displayed.   Lipid Panel: Recent Labs    12/10/20 0000  CHOL 147  HDL 51  LDLCALC 71  TRIG 176*  CHOLHDL 2.9   TSH: Recent Labs    12/10/20 0000 01/10/21 0946 03/13/21 1118  TSH 9.96* 4.000 1.880   A1C: No results found for: HGBA1C   Assessment/Plan 1. Bradycardia - HR 43-48, asymptomatic -  EKG 12-Lead- 1st degree AV block/old MI noted - will reduce metoprolol to 25 mg daily- discussed with cardiology - advised AL to record blood pressures and HR bid x 1 week  2. Chronic kidney disease, stage 3a ([place]) - BUN/creat 31/1.54 06/12/2021 - continue to avoid nephrotoxic drugs like NSAIDS  and dose adjust medications to be renally excreted - encourage hydration - Basic Metabolic Panel - bmp- future  3. Need for influenza vaccination - Flu Vaccine QUAD High Dose(Fluad)  4. Senile purpura ([place]) - education given  5. Chronic combined systolic and diastolic heart failure (H[facility]) - no recent weight fluctuations, sob or ankle edema - cont lasix  6. Coronary artery disease due to lipid rich plaque - stent placed 2020 - last echo EF 45% - cont metoprolol ans statin  7. Paroxysmal atrial fibrillation ([facility]) - rate controlled with amiodarone - cont eliquis for clot prevention  8. Primary hypertension - controlled - cont losartan and metoprolol - cbc/diff- future  9. Mild cognitive impairment - MMSE 27/30 - slow to respond at times, but appropriate  10. Acquired hypothyroidism - TSH 1.880 03/13/2021 - cont levothyroxine - TSH- future  11. Slow transit constipation - stable with senna daily and miralax prn  12. Nodule of skin of left foot - pea- sized fixed nodule to left ankle, non tender - DG Ankle Complete Left; Future  13. Dry skin - followed by dermatology - suspect psoriasis - reports itching to back - cont triamcinolone cream and xyzal - recommend trying gentle soap that is hypoallergenic     Total time: 42 minutes. Greater than 50% of total time spent doing patient education on symptom management and medication management.   Labs/procedure- cbc/diff, cmp, tsh- future   Next appt: Visit date not found  [place], [facility] [phone]

## 2021-06-13 ENCOUNTER — Other Ambulatory Visit: Payer: Self-pay | Admitting: Orthopedic Surgery

## 2021-06-13 DIAGNOSIS — I5042 Chronic combined systolic (congestive) and diastolic (congestive) heart failure: Secondary | ICD-10-CM

## 2021-06-13 DIAGNOSIS — I10 Essential (primary) hypertension: Secondary | ICD-10-CM

## 2021-06-13 MED ORDER — METOPROLOL SUCCINATE ER 50 MG PO TB24: 25.0000 mg | ORAL_TABLET | Freq: Every day | ORAL | 5 refills | Status: DC

## 2021-06-13 NOTE — Progress Notes (Signed)
Orders printed and faxed to Facility Fax: [phone]

## 2021-06-13 NOTE — Progress Notes (Signed)
Metoprolol reduced to 25 mg per cardiology. New orders discussed with Daughter, Page. I have also notified his assisted living residence on medication change and new nursing orders. They request a fax of new orders. Will have office fax orders.

## 2021-06-17 ENCOUNTER — Ambulatory Visit: Payer: Medicare Other | Admitting: Orthopedic Surgery

## 2021-06-19 ENCOUNTER — Ambulatory Visit: Payer: Medicare Other | Admitting: Orthopedic Surgery

## 2021-06-22 NOTE — Progress Notes (Signed)
Cardiology Office Note:    Date:  06/25/2021   ID:  Ronnie Joseph, DOB June 11, 1932, MRN [id number]  PCP:  [name], NP  Cardiologist:  None  Electrophysiologist:  Vickie Epley, MD   Referring MD: [name], NP   Chief Complaint  Patient presents with   Congestive Heart Failure     History of Present Illness:    Ronnie Joseph is a 87 y.o. male with a hx of CAD status post PCI to LAD and LCx in 05/2019, ischemic cardiomyopathy (EF 25% post PCI, improved to 35-40 % on most recent echo), atrial fibrillation on Eliquis who presents for an initial visit.  His previous cardiologist was [name] in [facility].  Echocardiogram 09/26/2020 in [place] showed LVEF 35 to 40%, mildly reduced RV systolic function.  Cardiac catheterization on 06/01/2019 showed tandem diffuse 90% stenosis of mid LAD status post DES, 95% distal LCx stenosis status post DES.  Echocardiogram on 05/26/2019 showed LVEF 25 to 30%.  He was admitted from 12/16 through 10/02/20 with NSTEMI in [place].  He presented to ED with chest pain.  Was also found to have fever to 100.8, lactic acid of 12, hemoglobin 7.5.  Troponin peaked at 8300.  He received 2 units PRBCs.  He was evaluated by cardiology, who recommended medical management.  He has a history of GI bleeds with AVMs.  Underwent EGD/colonoscopy on 09/30/20 which showed duodenal AVMs status post APC and colonoscopy showed ascending colon AVM status post APC.  During admission was tolerating Plavix and apixaban without any further bleeding.  He was started on amiodarone for his atrial fibrillation, with plans for amiodarone 200 mg twice daily x1 month then 200 mg daily.  Since last clinic visit, reports he is doing well.  Denies any chest pain, dyspnea, lightheadedness, syncope, lower extremity edema, or palpitations.  Reports has been taking Eliquis.  Has dark stools but is on iron supplements.  Has not noted any bleeding.   Wt Readings from Last 3  Encounters:  06/24/21 172 lb 3.2 oz (78.1 kg)  06/12/21 168 lb 6.4 oz (76.4 kg)  06/05/21 168 lb 4.8 oz (76.3 kg)    Past Medical History:  Diagnosis Date   Acute anemia 09/26/2020   Anemia due to acute blood loss 05/25/2019   Chronic maxillary sinusitis 08/06/2017   Coronary artery disease due to lipid rich plaque 07/27/2019   Essential hypertension 02/14/2003   Fainting 05/30/2019   Fall 05/25/1999   High cholesterol 03/14/2001   History of basal cell carcinoma 11/02/2019   History of pneumonia    History of snoring    History of upper respiratory infection    Irregular heartbeat 08/06/2003   Ischemic cardiomyopathy 05/30/2019   Mild cognitive impairment 05/15/2019   NSTEMI (non-ST elevated myocardial infarction) ([place]) 06/03/2019   Poor renal function 03/19/2005   Primary osteoarthritis of shoulder 10/24/2015   Psoriasis 12/29/2004   Rash and nonspecific skin eruption 04/30/2020   Schatzki's ring 08/07/2020   Sepsis with acute hypoxic respiratory failure ([place]) 09/26/2020   Sleep apnea 08/13/2003    Past Surgical History:  Procedure Laterality Date   APPENDECTOMY     CATARACT EXTRACTION     COLONOSCOPY     HERNIA REPAIR     RIGHT HEART CATHETERIZATION WITH ADENOSINE STUDY     UPPER GI ENDOSCOPY      Current Medications: Current Meds  Medication Sig   acetaminophen (TYLENOL) 500 MG tablet Take 500 mg by mouth 2 (  two) times daily.   albuterol (PROVENTIL) (2.5 MG/3ML) 0.083% nebulizer solution Take 2.5 mg by nebulization every 6 (six) hours as needed for wheezing or shortness of breath.   amiodarone (PACERONE) 200 MG tablet Take 200 mg by mouth daily.   aspirin EC 81 MG tablet Take 1 tablet (81 mg total) by mouth daily. Swallow whole.   benzonatate (TESSALON) 100 MG capsule Take 100 mg by mouth 3 (three) times daily as needed for cough.   bisacodyl (DULCOLAX) 5 MG EC tablet Take 5 mg by mouth daily as needed for moderate constipation.   CALCIUM CITRATE PO Take 1  tablet by mouth 2 (two) times daily.   empagliflozin (JARDIANCE) 10 MG TABS tablet Take 1 tablet (10 mg total) by mouth daily before breakfast.   ferrous sulfate 325 (65 FE) MG tablet Take 325 mg by mouth daily.   fluticasone (FLONASE) 50 MCG/ACT nasal spray Place 2 sprays into both nostrils 2 (two) times daily.   fluticasone furoate-vilanterol (BREO ELLIPTA) 100-25 MCG/INH AEPB Inhale 1 puff into the lungs daily.   furosemide (LASIX) 20 MG tablet Take 1 tablet (20 mg total) by mouth as needed (weight increase of 3 lbs in 1 day or 5 lbs in 1 week).   levocetirizine (XYZAL) 5 MG tablet Take 5 mg by mouth daily.   levothyroxine (SYNTHROID) 88 MCG tablet Take 1 tablet (88 mcg total) by mouth daily.   losartan (COZAAR) 50 MG tablet Take 50 mg by mouth in the morning.   metoprolol succinate (TOPROL-XL) 25 MG 24 hr tablet Take 25 mg by mouth daily.   montelukast (SINGULAIR) 10 MG tablet Take 10 mg by mouth daily.   Multiple Vitamin (MULTIVITAMIN PO) Take 1 tablet by mouth daily.   pantoprazole (PROTONIX) 40 MG tablet Take 40 mg by mouth daily.   polyethylene glycol (MIRALAX / GLYCOLAX) 17 g packet Take 17 g by mouth daily as needed.   rosuvastatin (CRESTOR) 20 MG tablet Take 20 mg by mouth daily.   Saw Palmetto, Serenoa repens, (SAW PALMETTO FRUIT PO) Take 450 mg by mouth 2 (two) times daily.   senna-docusate (SENOKOT-S) 8.6-50 MG tablet Take 1 tablet by mouth 2 (two) times daily.   triamcinolone cream (KENALOG) 0.1 % Apply topically daily.   [DISCONTINUED] apixaban (ELIQUIS) 5 MG TABS tablet Take 5 mg by mouth 2 (two) times daily.   [DISCONTINUED] metoprolol succinate (TOPROL-XL) 50 MG 24 hr tablet Take 0.5 tablets (25 mg total) by mouth daily. Take with or immediately following a meal.     Allergies:   Lisinopril, Sulfamethoxazole-trimethoprim, Telmisartan, Atorvastatin, and Sulfa antibiotics   Social History   Socioeconomic History   Marital status: Widowed    Spouse name: Not on file    Number of children: Not on file   Years of education: Not on file   Highest education level: Not on file  Occupational History   Not on file  Tobacco Use   Smoking status: Former    Packs/day: 0.25    Types: Cigarettes    Quit date: 12/17/1958    Years since quitting: 62.5   Smokeless tobacco: Never  Vaping Use   Vaping Use: Never used  Substance and Sexual Activity   Alcohol use: Yes    Alcohol/week: 3.0 standard drinks    Types: 3 Standard drinks or equivalent per week   Drug use: Never   Sexual activity: Not Currently  Other Topics Concern   Not on file  Social History Narrative   Tobacco  use, amount per day now: 0   Past tobacco use, amount per day: Less than 1 pack   How many years did you use tobacco: 5 years, stopped in 1960   Alcohol use (drinks per week): 4   Diet: N/A   Do you drink/eat things with caffeine: Yes.   Marital status: Widowed                                  What year were you married? 1960   Do you live in a house, apartment, assisted living, condo, trailer, etc.? Assisted Living.   Is it one or more stories? 1    How many persons live in your home? 1   Do you have pets in your home?( please list) No.   Highest Level of education completed? College   Current or past profession: Pharmacist   Do you exercise?  No.                                Type and how often?   Do you have a living will? Yes.   Do you have a DNR form?  Yes.                                 If not, do you want to discuss one?   Do you have signed POA/HPOA forms? Yes.                       If so, please bring to you appointment      Do you have any difficulty bathing or dressing yourself? Yes.   Do you have any difficulty preparing food or eating? Yes.   Do you have any difficulty managing your medications? Yes.   Do you have any difficulty managing your finances? No.   Do you have any difficulty affording your medications? No.   Social Determinants of Health   Financial Resource  Strain: Not on file  Food Insecurity: Not on file  Transportation Needs: Not on file  Physical Activity: Not on file  Stress: Not on file  Social Connections: Not on file     Family History: The patient's family history includes Brain cancer in his brother; Breast cancer in his daughter; Celiac disease in his daughter; Colon cancer in an other family member; Colon polyps in an other family member; Diabetes in his son; Diabetes Mellitus I in his son; Esophageal cancer in an other family member; Heart attack in his father; Heart disease in his father; Lung disease in his mother; Pancreatic cancer in an other family member; Stomach cancer in an other family member.  ROS:   Please see the history of present illness.    All other systems reviewed and are negative.  EKGs/Labs/Other Studies Reviewed:    The following studies were reviewed today:   EKG:    9/22-sinus bradycardia, rate 52, first-degree AV block, right bundle branch block, Q waves in inferior leads and lateral leads 6/22- The EKG ordered demonstrates Sinus rhythm, rate 59, non specific  intraventricular  conduction delay, first degree aV block, poor R wave progression, Q waves in inferior leads,  4/22- EKG was not ordered today 2/22- sinus bradycardia, rate 58, first-degree AV block, inferior Q waves, RBBB  Recent Labs: 12/10/2020:  Brain Natriuretic Peptide 361 03/13/2021: ALT 11; TSH 1.880 06/24/2021: BUN 21; Creatinine, Ser 1.74; Hemoglobin 11.6; Magnesium 2.6; Platelets 310; Potassium 5.1; Sodium 141  Recent Lipid Panel    Component Value Date/Time   CHOL 147 12/10/2020 0000   TRIG 176 (H) 12/10/2020 0000   HDL 51 12/10/2020 0000   CHOLHDL 2.9 12/10/2020 0000   LDLCALC 71 12/10/2020 0000    Physical Exam:    VS:  BP (!) 126/50 (BP Location: Left Arm, Patient Position: Sitting, Cuff Size: Normal)   Pulse (!) 52   Ht '5\' 8"'$  (1.727 m)   Wt 172 lb 3.2 oz (78.1 kg)   SpO2 95%   BMI 26.18 kg/m     Wt Readings from Last  3 Encounters:  06/24/21 172 lb 3.2 oz (78.1 kg)  06/12/21 168 lb 6.4 oz (76.4 kg)  06/05/21 168 lb 4.8 oz (76.3 kg)     GEN:  in no acute distress HEENT: Normal NECK: No JVD CARDIAC: RRR, 2/6 systolic murmur RESPIRATORY:  Clear to auscultation without rales, wheezing or rhonchi  ABDOMEN: Soft, non-tender, non-distended MUSCULOSKELETAL:  Trace edema in bilateral LE; No deformity  SKIN: Warm and dry NEUROLOGIC:  Alert and oriented x 3 PSYCHIATRIC:  Normal affect   ASSESSMENT:    1. CAD in native artery   2. Chronic combined systolic and diastolic heart failure ([place])   3. Atrial fibrillation, unspecified type ([place])   4. Anemia, unspecified type     PLAN:    CAD: Cardiac catheterization on 06/01/2019 showed tandem diffuse 90% stenosis of mid LAD status post DES, 95% distal LCx stenosis status post DES.  Admitted in [place] with NSTEMI 09/2020.  In setting of GI bleed, medical management was recommended. -Continue aspirin plus Eliquis.   -Continue rosuvastatin 20 mg daily   Chronic combined systolic and diastolic heart failure: EF 35-40% on most recent echo 09/2020.   -Continue Toprol-XL 25 mg daily -Continue losartan 50 mg daily -Continue Jardiance 10 mg daily -Continue spironolactone 12.5 mg daily -Continue Lasix 20 mg daily as needed for weight gain greater than 3 pounds in 1 day or 5 pounds in 1 week -Check BMET -Repeat echo to monitor for improvement in systolic function   Atrial fibrillation: Started on amiodarone during recent admission.  Currently in sinus rhythm.  CHA2DS2-VASc score 5 (CHF, hypertension, age x2, CAD) -Continue amiodarone 200 mg daily -Continue Toprol-XL 25 mg daily -Continue Eliquis, can reduce dose to 2.5 mg BID given age over 62 and creatinine greater than 1.5 -While he has an elevated CHA2DS2-VASc score, he is a high risk anticoagulation candidate given his history of GI bleeding and falls.  Referred to Dr. Quentin Ore for [facility] evaluation,  recommended monitoring for now, if have further bleeding consider Watchman   Anemia: Had GI bleed, has iron deficiency anemia.    Denies any recent bleeding, reports difficult to tell given black-colored stools on iron supplements.  Reports has been having significant issues with constipation on p.o. iron.  Referred to GI for evaluation.  Was referred hematology for anemia evaluation   Hypertension: On Toprol-XL, spironolactone, and losartan.  Appears controlled   Hyperlipidemia: On rosuvastatin 20 mg daily.  LDL 71 on 12/10/2020   Hypothyroidism: TSH has been elevated, PCP adjusting levothyroxine dose.  TSH 03/13/21 was normal   RTC in 3 months     Medication Adjustments/Labs and Tests Ordered: Current medicines are reviewed at length with the patient today.  Concerns regarding medicines are outlined above.  Orders Placed This Encounter  Procedures   Basic metabolic panel   CBC   Magnesium   EKG 12-Lead   ECHOCARDIOGRAM COMPLETE    Meds ordered this encounter  Medications   DISCONTD: apixaban (ELIQUIS) 2.5 MG TABS tablet    Sig: Take 1 tablet (2.5 mg total) by mouth 2 (two) times daily.    Dispense:  180 tablet    Refill:  3   apixaban (ELIQUIS) 2.5 MG TABS tablet    Sig: Take 1 tablet (2.5 mg total) by mouth 2 (two) times daily.    Dispense:  180 tablet    Refill:  3    Dose decrease     Patient Instructions  Medication Instructions:  DECREASE Eliquis to 2.5 mg two times daily  *If you need a refill on your cardiac medications before your next appointment, please call your pharmacy*   Lab Work: BMET, CBC, Mag today  If you have labs (blood work) drawn today and your tests are completely normal, you will receive your results only by: [place] (if you have MyChart) OR A paper copy in the mail If you have any lab test that is abnormal or we need to change your treatment, we will call you to review the results.   Testing/Procedures: Your physician has  requested that you have an echocardiogram. Echocardiography is a painless test that uses sound waves to create images of your heart. It provides your doctor with information about the size and shape of your heart and how well your heart's chambers and valves are working. This procedure takes approximately one hour. There are no restrictions for this procedure.  Follow-Up: At [facility], you and your health needs are our priority.  As part of our continuing mission to provide you with exceptional heart care, we have created designated Provider Care Teams.  These Care Teams include your primary Cardiologist (physician) and Advanced Practice Providers (APPs -  Physician Assistants and Nurse Practitioners) who all work together to provide you with the care you need, when you need it.  We recommend signing up for the patient portal called "MyChart".  Sign up information is provided on this After Visit Summary.  MyChart is used to connect with patients for Virtual Visits (Telemedicine).  Patients are able to view lab/test results, encounter notes, upcoming appointments, etc.  Non-urgent messages can be sent to your provider as well.   To learn more about what you can do with MyChart, go to [email or web address].    Your next appointment:   3 month(s)  The format for your next appointment:   In Person  Provider:   Oswaldo Milian, MD        Signed, Donato Heinz, MD  06/25/2021 9:42 PM    [place]

## 2021-06-24 ENCOUNTER — Encounter: Payer: Self-pay | Admitting: Cardiology

## 2021-06-24 ENCOUNTER — Ambulatory Visit (INDEPENDENT_AMBULATORY_CARE_PROVIDER_SITE_OTHER): Payer: Medicare Other | Admitting: Cardiology

## 2021-06-24 ENCOUNTER — Other Ambulatory Visit: Payer: Self-pay

## 2021-06-24 VITALS — BP 126/50 | HR 52 | Ht 68.0 in | Wt 172.2 lb

## 2021-06-24 DIAGNOSIS — I2510 Atherosclerotic heart disease of native coronary artery without angina pectoris: Secondary | ICD-10-CM | POA: Diagnosis not present

## 2021-06-24 DIAGNOSIS — I5042 Chronic combined systolic (congestive) and diastolic (congestive) heart failure: Secondary | ICD-10-CM

## 2021-06-24 DIAGNOSIS — I10 Essential (primary) hypertension: Secondary | ICD-10-CM

## 2021-06-24 DIAGNOSIS — D649 Anemia, unspecified: Secondary | ICD-10-CM

## 2021-06-24 DIAGNOSIS — I4891 Unspecified atrial fibrillation: Secondary | ICD-10-CM

## 2021-06-24 LAB — BASIC METABOLIC PANEL
BUN/Creatinine Ratio: 12 (ref 10–24)
BUN: 21 mg/dL (ref 8–27)
CO2: 24 mmol/L (ref 20–29)
Calcium: 9.5 mg/dL (ref 8.6–10.2)
Chloride: 103 mmol/L (ref 96–106)
Creatinine, Ser: 1.74 mg/dL — ABNORMAL HIGH (ref 0.76–1.27)
Glucose: 89 mg/dL (ref 65–99)
Potassium: 5.1 mmol/L (ref 3.5–5.2)
Sodium: 141 mmol/L (ref 134–144)
eGFR: 37 mL/min/{1.73_m2} — ABNORMAL LOW (ref >59)

## 2021-06-24 LAB — CBC
Hematocrit: 34.1 % — ABNORMAL LOW (ref 37.5–51.0)
Hemoglobin: 11.6 g/dL — ABNORMAL LOW (ref 13.0–17.7)
MCH: 34.3 pg — ABNORMAL HIGH (ref 26.6–33.0)
MCHC: 34 g/dL (ref 31.5–35.7)
MCV: 101 fL — ABNORMAL HIGH (ref 79–97)
Platelets: 310 10*3/uL (ref 150–450)
RBC: 3.38 x10E6/uL — ABNORMAL LOW (ref 4.14–5.80)
RDW: 13.2 % (ref 11.6–15.4)
WBC: 8.8 10*3/uL (ref 3.4–10.8)

## 2021-06-24 LAB — MAGNESIUM: Magnesium: 2.6 mg/dL — ABNORMAL HIGH (ref 1.6–2.3)

## 2021-06-24 MED ORDER — APIXABAN 2.5 MG PO TABS: 2.5000 mg | ORAL_TABLET | Freq: Two times a day (BID) | ORAL | 3 refills | Status: DC

## 2021-06-24 NOTE — Patient Instructions (Signed)
Medication Instructions:  DECREASE Eliquis to 2.5 mg two times daily  *If you need a refill on your cardiac medications before your next appointment, please call your pharmacy*   Lab Work: BMET, CBC, Mag today  If you have labs (blood work) drawn today and your tests are completely normal, you will receive your results only by: [place] (if you have MyChart) OR A paper copy in the mail If you have any lab test that is abnormal or we need to change your treatment, we will call you to review the results.   Testing/Procedures: Your physician has requested that you have an echocardiogram. Echocardiography is a painless test that uses sound waves to create images of your heart. It provides your doctor with information about the size and shape of your heart and how well your heart's chambers and valves are working. This procedure takes approximately one hour. There are no restrictions for this procedure.  Follow-Up: At [facility], you and your health needs are our priority.  As part of our continuing mission to provide you with exceptional heart care, we have created designated Provider Care Teams.  These Care Teams include your primary Cardiologist (physician) and Advanced Practice Providers (APPs -  Physician Assistants and Nurse Practitioners) who all work together to provide you with the care you need, when you need it.  We recommend signing up for the patient portal called "MyChart".  Sign up information is provided on this After Visit Summary.  MyChart is used to connect with patients for Virtual Visits (Telemedicine).  Patients are able to view lab/test results, encounter notes, upcoming appointments, etc.  Non-urgent messages can be sent to your provider as well.   To learn more about what you can do with MyChart, go to [email or web address].    Your next appointment:   3 month(s)  The format for your next appointment:   In Person  Provider:   Oswaldo Milian,  MD

## 2021-07-01 ENCOUNTER — Other Ambulatory Visit: Payer: Self-pay

## 2021-07-01 MED ORDER — APIXABAN 2.5 MG PO TABS: 2.5000 mg | ORAL_TABLET | Freq: Two times a day (BID) | ORAL | 1 refills | Status: DC

## 2021-07-01 NOTE — Telephone Encounter (Signed)
Prescription refill request for Eliquis received. Indication:afib Last office visit:[facility] 06/24/21 Scr:1.74 06/24/21 Age: 33m Weight:78.1

## 2021-07-03 ENCOUNTER — Other Ambulatory Visit: Payer: Self-pay | Admitting: *Deleted

## 2021-07-03 ENCOUNTER — Encounter: Payer: Self-pay | Admitting: Orthopedic Surgery

## 2021-07-03 DIAGNOSIS — E039 Hypothyroidism, unspecified: Secondary | ICD-10-CM

## 2021-07-03 MED ORDER — AMIODARONE HCL 200 MG PO TABS: 200.0000 mg | ORAL_TABLET | Freq: Every day | ORAL | 3 refills | Status: DC

## 2021-07-03 MED ORDER — EMPAGLIFLOZIN 10 MG PO TABS: 10.0000 mg | ORAL_TABLET | Freq: Every day | ORAL | 3 refills | Status: DC

## 2021-07-03 MED ORDER — ROSUVASTATIN CALCIUM 20 MG PO TABS: 20.0000 mg | ORAL_TABLET | Freq: Every day | ORAL | 1 refills | Status: DC

## 2021-07-03 MED ORDER — LEVOTHYROXINE SODIUM 88 MCG PO TABS: 88.0000 ug | ORAL_TABLET | Freq: Every day | ORAL | 1 refills | Status: DC

## 2021-07-03 MED ORDER — FLUTICASONE FUROATE-VILANTEROL 100-25 MCG/INH IN AEPB: 1.0000 | INHALATION_SPRAY | Freq: Every day | RESPIRATORY_TRACT | 1 refills | Status: DC

## 2021-07-03 MED ORDER — APIXABAN 2.5 MG PO TABS: 2.5000 mg | ORAL_TABLET | Freq: Two times a day (BID) | ORAL | 3 refills | Status: DC

## 2021-07-03 MED ORDER — LOSARTAN POTASSIUM 50 MG PO TABS: 50.0000 mg | ORAL_TABLET | Freq: Every morning | ORAL | 3 refills | Status: DC

## 2021-07-03 MED ORDER — PANTOPRAZOLE SODIUM 40 MG PO TBEC: 40.0000 mg | DELAYED_RELEASE_TABLET | Freq: Every day | ORAL | 1 refills | Status: DC

## 2021-07-03 MED ORDER — SPIRONOLACTONE 25 MG PO TABS: 12.5000 mg | ORAL_TABLET | Freq: Every day | ORAL | 3 refills | Status: DC

## 2021-07-03 MED ORDER — LEVOCETIRIZINE DIHYDROCHLORIDE 5 MG PO TABS: 5.0000 mg | ORAL_TABLET | Freq: Every day | ORAL | 1 refills | Status: DC

## 2021-07-03 NOTE — Telephone Encounter (Signed)
[facility] called stating that daughter is wanting to switch patient's medications from [facility] to [address]. Stated that they called [facility] to transfer the Rx's but they don't do that.   Requesting Rx's  Pended Rx's and sent to Amy for approval.

## 2021-07-04 MED ORDER — LOSARTAN POTASSIUM 50 MG PO TABS: 50.0000 mg | ORAL_TABLET | Freq: Every morning | ORAL | 3 refills | Status: DC

## 2021-07-04 MED ORDER — AMIODARONE HCL 200 MG PO TABS: 200.0000 mg | ORAL_TABLET | Freq: Every day | ORAL | 3 refills | Status: DC

## 2021-07-04 MED ORDER — EMPAGLIFLOZIN 10 MG PO TABS: 10.0000 mg | ORAL_TABLET | Freq: Every day | ORAL | 3 refills | Status: DC

## 2021-07-04 MED ORDER — SPIRONOLACTONE 25 MG PO TABS: 12.5000 mg | ORAL_TABLET | Freq: Every day | ORAL | 3 refills | Status: DC

## 2021-07-04 MED ORDER — APIXABAN 2.5 MG PO TABS: 2.5000 mg | ORAL_TABLET | Freq: Two times a day (BID) | ORAL | 3 refills | Status: AC

## 2021-07-04 NOTE — Addendum Note (Signed)
Addended by: Fidel Levy on: 07/04/2021 01:33 PM   Modules accepted: Orders

## 2021-07-07 ENCOUNTER — Ambulatory Visit
Admission: RE | Admit: 2021-07-07 | Discharge: 2021-07-07 | Disposition: A | Discharge status: Home / Self Care | Payer: Medicare Other | Source: Ambulatory Visit | Attending: Orthopedic Surgery | Admitting: Orthopedic Surgery

## 2021-07-07 ENCOUNTER — Other Ambulatory Visit: Payer: Self-pay

## 2021-07-07 DIAGNOSIS — R2242 Localized swelling, mass and lump, left lower limb: Secondary | ICD-10-CM

## 2021-07-08 ENCOUNTER — Other Ambulatory Visit: Payer: Self-pay | Admitting: *Deleted

## 2021-07-08 MED ORDER — ROSUVASTATIN CALCIUM 20 MG PO TABS: 20.0000 mg | ORAL_TABLET | Freq: Every day | ORAL | 3 refills | Status: DC

## 2021-07-08 MED ORDER — EMPAGLIFLOZIN 10 MG PO TABS: 10.0000 mg | ORAL_TABLET | Freq: Every day | ORAL | 3 refills | Status: DC

## 2021-07-08 MED ORDER — SPIRONOLACTONE 25 MG PO TABS: 12.5000 mg | ORAL_TABLET | Freq: Every day | ORAL | 3 refills | Status: DC

## 2021-07-08 MED ORDER — AMIODARONE HCL 200 MG PO TABS: 200.0000 mg | ORAL_TABLET | Freq: Every day | ORAL | 3 refills | Status: DC

## 2021-07-08 MED ORDER — LOSARTAN POTASSIUM 50 MG PO TABS: 50.0000 mg | ORAL_TABLET | Freq: Every morning | ORAL | 3 refills | Status: DC

## 2021-07-11 ENCOUNTER — Encounter: Payer: Self-pay | Admitting: Orthopedic Surgery

## 2021-07-11 NOTE — Telephone Encounter (Signed)
Message routed to [place], Amy E, NP  

## 2021-07-14 ENCOUNTER — Other Ambulatory Visit: Payer: Self-pay | Admitting: Orthopedic Surgery

## 2021-07-14 DIAGNOSIS — H9193 Unspecified hearing loss, bilateral: Secondary | ICD-10-CM

## 2021-07-15 ENCOUNTER — Ambulatory Visit (HOSPITAL_COMMUNITY): Payer: Medicare Other | Attending: Cardiology

## 2021-07-15 ENCOUNTER — Other Ambulatory Visit: Payer: Self-pay

## 2021-07-15 DIAGNOSIS — I5042 Chronic combined systolic (congestive) and diastolic (congestive) heart failure: Secondary | ICD-10-CM | POA: Diagnosis present

## 2021-07-15 LAB — ECHOCARDIOGRAM COMPLETE
Area-P 1/2: 1.86 cm2
P 1/2 time: 670 msec
S' Lateral: 4.3 cm

## 2021-07-23 ENCOUNTER — Other Ambulatory Visit: Payer: Self-pay

## 2021-07-23 ENCOUNTER — Ambulatory Visit: Payer: Medicare Other | Attending: Orthopedic Surgery | Admitting: Audiologist

## 2021-07-23 DIAGNOSIS — H903 Sensorineural hearing loss, bilateral: Secondary | ICD-10-CM | POA: Diagnosis not present

## 2021-07-23 NOTE — Procedures (Signed)
Outpatient Audiology and [address] [place], [place]  [address] [phone]  AUDIOLOGICAL  EVALUATION  NAME: Ronnie Joseph     DOB:   1932/01/25      MRN: [id number]                                                                                     DATE: 07/23/2021     REFERENT: [name], NP STATUS: Outpatient DIAGNOSIS: Sensorineural Hearing Loss Bilateral     History: Ronnie Joseph, 87 y.o., was seen for an audiological evaluation. Ronnie Joseph was accompanied to the appointment by his daughter.  Ronnie Joseph is receiving a hearing evaluation due to concerns for difficulty hearing the television. Ronnie Joseph has difficulty hearing in background noise. He has history of noise exposure from recreational firearm use for many years. He has recently had the wax from his ears removed. It tends to build up over time. He also had lots of hair in his ears. Ronnie Joseph says he spends most of his time alone. When he is around people he does not struggle to hear, his difficulty is isolated to background noise and the TV. He has never tried hearing aids. He denies any pain or pressure in either ear. He has no tinnitus. He has never had an ear infection.  Medical history positive for mild cognitive impairment which is a risk factor for hearing loss. No other relevant case history reported.    Evaluation:  Otoscopy showed a partial view of the tympanic membranes with significant hair, bilaterally Tympanometry was attempted. Seal could not be obtained due to excessive hair in and around the auditory canal.  Audiometric testing was completed using conventional audiometry with insert transducer. Speech Recognition Thresholds were consistent with pure tone averages. Word Recognition was good at an an elevated level of 45dB SL with masking in each ear. Pure tone thresholds show normal sloping to moderately severe sensorineural hearing loss in both ears. 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    Results:  The test results were reviewed with Ronnie Joseph and his daughter. Ronnie Joseph was counseled on the nature and degree of his hearing loss, he  was provided with several copies of his audiogram that illustrate his degree of hearing loss in both ears. His hearing loss is in the high frequencies preventing Ronnie Joseph from hearing high frequency consonants such as /s/ /sh/ /f/ /t/ and /th/. These sounds help differentiate the words he  hears. Without these sounds, speech is muffled and unclear unless someone is face to face within 5 feet without a mask. Ronnie Joseph is an excellent hearing aid candidate. With hearing aids the clarity of speech will improve and Ronnie Joseph will not have to work so hard to hear. Ronnie Joseph was provided with a list of audiologists that dispense hearing aids. Ronnie Joseph expressed interest in first trying a sound bar with his TV to see if this helps. Based on his description he sits far away from his television. A sound bar will help improve the clarity of what he is listening to.   Recommendations: Amplification is necessary for both ears. Hearing aids can be purchased from a variety of locations. See provided list for locations  in the [place] area.  If Ronnie Joseph is not ready for every day use of aids, also recommend use of Sound Bar or TV Ears with the television.    Ronnie Joseph  Audiologist, Au.Ronnie., CCC-A 07/23/2021  2:48 PM  Cc: [name], NP

## 2021-07-24 ENCOUNTER — Other Ambulatory Visit: Payer: Self-pay

## 2021-07-29 MED ORDER — LOSARTAN POTASSIUM 50 MG PO TABS: 50.0000 mg | ORAL_TABLET | Freq: Every morning | ORAL | 3 refills | Status: DC

## 2021-07-30 ENCOUNTER — Telehealth: Payer: Self-pay | Admitting: Cardiology

## 2021-07-30 ENCOUNTER — Ambulatory Visit: Payer: Medicare Other | Admitting: Physician Assistant

## 2021-07-30 ENCOUNTER — Other Ambulatory Visit: Payer: Medicare Other

## 2021-07-30 NOTE — Telephone Encounter (Signed)
Returned call to [facility]. Advised her that per patient's daughter patient has been tolerating losartan without side effects. I was transferred to pharmacist, Lennette Bihari to complete refill. He thanked me for the call.

## 2021-07-30 NOTE — Telephone Encounter (Signed)
Fill records show pt has been taking losartan for at least the last few months but prior rx being filled at a different pharmacy. He does have hives to telmisartan on his allergy list and swelling with lisinopril. Ok to refill losartan as long as he has been tolerating it without side effects.

## 2021-07-30 NOTE — Telephone Encounter (Signed)
Pt c/o medication issue:  1. Name of Medication:losartan (COZAAR) 50 MG tablet  2. How are you currently taking this medication (dosage and times per day)? Pharmacy has not filled yet  3. Are you having a reaction (difficulty breathing--STAT)?   4. What is your medication issue? Pharmacy was calling to report an allergy.  The pharmacy said the patient is allergic/sensitive to Angiotensin Receptor Blockers.   The pharmacy wanted advice before they fill the rx.  Please call [phone] and select option 2. That is a more direct number to the Pharmacists. Use  Reference # [id number]

## 2021-07-31 ENCOUNTER — Other Ambulatory Visit: Payer: Self-pay | Admitting: Physician Assistant

## 2021-07-31 DIAGNOSIS — D500 Iron deficiency anemia secondary to blood loss (chronic): Secondary | ICD-10-CM

## 2021-08-01 ENCOUNTER — Inpatient Hospital Stay (HOSPITAL_BASED_OUTPATIENT_CLINIC_OR_DEPARTMENT_OTHER): Payer: Medicare Other | Admitting: Physician Assistant

## 2021-08-01 ENCOUNTER — Inpatient Hospital Stay: Payer: Medicare Other | Attending: Hematology and Oncology

## 2021-08-01 ENCOUNTER — Other Ambulatory Visit: Payer: Self-pay

## 2021-08-01 VITALS — BP 132/42 | HR 55 | Temp 97.8°F | Resp 16 | Ht 68.0 in | Wt 172.3 lb

## 2021-08-01 DIAGNOSIS — Q2733 Arteriovenous malformation of digestive system vessel: Secondary | ICD-10-CM | POA: Insufficient documentation

## 2021-08-01 DIAGNOSIS — D500 Iron deficiency anemia secondary to blood loss (chronic): Secondary | ICD-10-CM | POA: Insufficient documentation

## 2021-08-01 LAB — CBC WITH DIFFERENTIAL (CANCER CENTER ONLY)
Abs Immature Granulocytes: 0.02 10*3/uL (ref 0.00–0.07)
Basophils Absolute: 0.1 10*3/uL (ref 0.0–0.1)
Basophils Relative: 1 %
Eosinophils Absolute: 0.5 10*3/uL (ref 0.0–0.5)
Eosinophils Relative: 6 %
HCT: 36.3 % — ABNORMAL LOW (ref 39.0–52.0)
Hemoglobin: 11.9 g/dL — ABNORMAL LOW (ref 13.0–17.0)
Immature Granulocytes: 0 %
Lymphocytes Relative: 18 %
Lymphs Abs: 1.7 10*3/uL (ref 0.7–4.0)
MCH: 34.6 pg — ABNORMAL HIGH (ref 26.0–34.0)
MCHC: 32.8 g/dL (ref 30.0–36.0)
MCV: 105.5 fL — ABNORMAL HIGH (ref 80.0–100.0)
Monocytes Absolute: 1 10*3/uL (ref 0.1–1.0)
Monocytes Relative: 10 %
Neutro Abs: 6.2 10*3/uL (ref 1.7–7.7)
Neutrophils Relative %: 65 %
Platelet Count: 311 10*3/uL (ref 150–400)
RBC: 3.44 MIL/uL — ABNORMAL LOW (ref 4.22–5.81)
RDW: 13.7 % (ref 11.5–15.5)
WBC Count: 9.5 10*3/uL (ref 4.0–10.5)
nRBC: 0 % (ref 0.0–0.2)

## 2021-08-01 LAB — IRON AND TIBC
Iron: 109 ug/dL (ref 45–182)
Saturation Ratios: 31 % (ref 17.9–39.5)
TIBC: 352 ug/dL (ref 250–450)
UIBC: 243 ug/dL

## 2021-08-01 LAB — RETIC PANEL
Immature Retic Fract: 9.6 % (ref 2.3–15.9)
RBC.: 3.45 MIL/uL — ABNORMAL LOW (ref 4.22–5.81)
Retic Count, Absolute: 35.5 10*3/uL (ref 19.0–186.0)
Retic Ct Pct: 1 % (ref 0.4–3.1)
Reticulocyte Hemoglobin: 38 pg (ref >27.9)

## 2021-08-01 LAB — FERRITIN: Ferritin: 27 ng/mL (ref 24–336)

## 2021-08-01 NOTE — Progress Notes (Signed)
[place] Telephone:([phone]) [phone]   [phone]) [phone]  PROGRESS NOTE  Patient Care Team: [name], NP as PCP - General (Adult Health Nurse Practitioner) Vickie Epley, MD as PCP - Electrophysiology (Cardiology) Jari Pigg, MD as Consulting Physician (Dermatology) Donato Heinz, MD as Consulting Physician (Cardiology) [name], NP (Adult Health Nurse Practitioner)  CHIEF COMPLAINTS/PURPOSE OF CONSULTATION:  "Iron deficiency anemia "  HISTORY OF PRESENTING ILLNESS:  Ronnie Joseph 87 y.o. male returns for a follow up for iron deficiency anemia. Patient is accompanied by her daughter for this visit.   On exam today, Ronnie Joseph reports that his energy levels are fairly stable. He is able to ambulate with the assistance of a walker.  His appetite is good without any noticeable weight changes.  He denies any nausea, vomiting or abdominal pain.  Patient takes MiraLAX for constipation and has a daily bowel movement.  He denies easy bruising or signs of bleeding.  This includes hematochezia, melena, hemoptysis, epistaxis, hematuria or gingival bleeding.  Patient denies shortness of breath, chest pain, dizziness, syncopal episodes, fevers chills or night sweats.  He has no other complaints.  Rest of the 10 point ROS is below.  MEDICAL HISTORY:  Past Medical History:  Diagnosis Date   Acute anemia 09/26/2020   Anemia due to acute blood loss 05/25/2019   Chronic maxillary sinusitis 08/06/2017   Coronary artery disease due to lipid rich plaque 07/27/2019   Essential hypertension 02/14/2003   Fainting 05/30/2019   Fall 05/25/1999   High cholesterol 03/14/2001   History of basal cell carcinoma 11/02/2019   History of pneumonia    History of snoring    History of upper respiratory infection    Irregular heartbeat 08/06/2003   Ischemic cardiomyopathy 05/30/2019   Mild cognitive impairment 05/15/2019   NSTEMI (non-ST elevated myocardial infarction) ([place])  06/03/2019   Poor renal function 03/19/2005   Primary osteoarthritis of shoulder 10/24/2015   Psoriasis 12/29/2004   Rash and nonspecific skin eruption 04/30/2020   Schatzki's ring 08/07/2020   Sepsis with acute hypoxic respiratory failure ([place]) 09/26/2020   Sleep apnea 08/13/2003    SURGICAL HISTORY: Past Surgical History:  Procedure Laterality Date   APPENDECTOMY     CATARACT EXTRACTION     COLONOSCOPY     HERNIA REPAIR     RIGHT HEART CATHETERIZATION WITH ADENOSINE STUDY     UPPER GI ENDOSCOPY      SOCIAL HISTORY: Social History   Socioeconomic History   Marital status: Widowed    Spouse name: Not on file   Number of children: Not on file   Years of education: Not on file   Highest education level: Not on file  Occupational History   Not on file  Tobacco Use   Smoking status: Former    Packs/day: 0.25    Types: Cigarettes    Quit date: 12/17/1958    Years since quitting: 62.6   Smokeless tobacco: Never  Vaping Use   Vaping Use: Never used  Substance and Sexual Activity   Alcohol use: Yes    Alcohol/week: 3.0 standard drinks    Types: 3 Standard drinks or equivalent per week   Drug use: Never   Sexual activity: Not Currently  Other Topics Concern   Not on file  Social History Narrative   Tobacco use, amount per day now: 0   Past tobacco use, amount per day: Less than 1 pack   How many years did you use tobacco:  5 years, stopped in 1960   Alcohol use (drinks per week): 4   Diet: N/A   Do you drink/eat things with caffeine: Yes.   Marital status: Widowed                                  What year were you married? 1960   Do you live in a house, apartment, assisted living, condo, trailer, etc.? Assisted Living.   Is it one or more stories? 1    How many persons live in your home? 1   Do you have pets in your home?( please list) No.   Highest Level of education completed? College   Current or past profession: Pharmacist   Do you exercise?  No.                                 Type and how often?   Do you have a living will? Yes.   Do you have a DNR form?  Yes.                                 If not, do you want to discuss one?   Do you have signed POA/HPOA forms? Yes.                       If so, please bring to you appointment      Do you have any difficulty bathing or dressing yourself? Yes.   Do you have any difficulty preparing food or eating? Yes.   Do you have any difficulty managing your medications? Yes.   Do you have any difficulty managing your finances? No.   Do you have any difficulty affording your medications? No.   Social Determinants of Health   Financial Resource Strain: Not on file  Food Insecurity: Not on file  Transportation Needs: Not on file  Physical Activity: Not on file  Stress: Not on file  Social Connections: Not on file  Intimate Partner Violence: Not on file    FAMILY HISTORY: Family History  Problem Relation Age of Onset   Lung disease Mother    Heart disease Father    Heart attack Father    Brain cancer Brother    Breast cancer Daughter    Celiac disease Daughter    Diabetes Son    Diabetes Mellitus I Son    Stomach cancer Other    Pancreatic cancer Other    Esophageal cancer Other    Colon polyps Other    Colon cancer Other     ALLERGIES:  is allergic to lisinopril, sulfamethoxazole-trimethoprim, telmisartan, atorvastatin, and sulfa antibiotics.  MEDICATIONS:  Current Outpatient Medications  Medication Sig Dispense Refill   acetaminophen (TYLENOL) 500 MG tablet Take 500 mg by mouth 2 (two) times daily.     albuterol (PROVENTIL) (2.5 MG/3ML) 0.083% nebulizer solution Take 2.5 mg by nebulization every 6 (six) hours as needed for wheezing or shortness of breath.     amiodarone (PACERONE) 200 MG tablet Take 1 tablet (200 mg total) by mouth daily. 90 tablet 3   apixaban (ELIQUIS) 2.5 MG TABS tablet Take 1 tablet (2.5 mg total) by mouth 2 (two) times daily. 180 tablet 3   aspirin EC 81 MG tablet  Take 1 tablet (81  mg total) by mouth daily. Swallow whole. 30 tablet 11   benzonatate (TESSALON) 100 MG capsule Take 100 mg by mouth 3 (three) times daily as needed for cough.     bisacodyl (DULCOLAX) 5 MG EC tablet Take 5 mg by mouth daily as needed for moderate constipation.     CALCIUM CITRATE PO Take 1 tablet by mouth 2 (two) times daily.     Emollient (CERAVE SA RENEWING) CREA Apply 1 application topically daily. Apply to pruritic area including neck, beltline, and upper chest. (Patient not taking: Reported on 06/24/2021)     empagliflozin (JARDIANCE) 10 MG TABS tablet Take 1 tablet (10 mg total) by mouth daily before breakfast. 90 tablet 3   ferrous sulfate 325 (65 FE) MG tablet Take 325 mg by mouth daily.     fluticasone (FLONASE) 50 MCG/ACT nasal spray Place 2 sprays into both nostrils 2 (two) times daily.     fluticasone furoate-vilanterol (BREO ELLIPTA) 100-25 MCG/INH AEPB Inhale 1 puff into the lungs daily. 180 each 1   furosemide (LASIX) 20 MG tablet Take 1 tablet (20 mg total) by mouth as needed (weight increase of 3 lbs in 1 day or 5 lbs in 1 week). 30 tablet 3   ketoconazole (NIZORAL) 2 % cream Apply 1 application topically daily as needed for irritation. (Patient not taking: Reported on 06/24/2021)     levocetirizine (XYZAL) 5 MG tablet Take 1 tablet (5 mg total) by mouth daily. 90 tablet 1   levothyroxine (SYNTHROID) 88 MCG tablet Take 1 tablet (88 mcg total) by mouth daily. 90 tablet 1   losartan (COZAAR) 50 MG tablet Take 1 tablet (50 mg total) by mouth in the morning. 90 tablet 3   metoprolol succinate (TOPROL-XL) 25 MG 24 hr tablet Take 1 tablet (25 mg total) by mouth daily. 90 tablet 1   montelukast (SINGULAIR) 10 MG tablet Take 1 tablet (10 mg total) by mouth daily. 90 tablet 1   Multiple Vitamin (MULTIVITAMIN PO) Take 1 tablet by mouth daily.     pantoprazole (PROTONIX) 40 MG tablet Take 1 tablet (40 mg total) by mouth daily. 90 tablet 1   polyethylene glycol (MIRALAX /  GLYCOLAX) 17 g packet Take 17 g by mouth daily as needed.     rosuvastatin (CRESTOR) 20 MG tablet Take 1 tablet (20 mg total) by mouth daily. 90 tablet 3   Saw Palmetto, Serenoa repens, (SAW PALMETTO FRUIT PO) Take 450 mg by mouth 2 (two) times daily.     senna-docusate (SENOKOT-S) 8.6-50 MG tablet Take 1 tablet by mouth 2 (two) times daily.     spironolactone (ALDACTONE) 25 MG tablet Take 0.5 tablets (12.5 mg total) by mouth daily. 45 tablet 3   triamcinolone cream (KENALOG) 0.1 % Apply topically daily.     No current facility-administered medications for this visit.    REVIEW OF SYSTEMS:   Constitutional: ( - ) fevers, ( - )  chills , ( - ) night sweats Eyes: ( - ) blurriness of vision, ( - ) double vision, ( - ) watery eyes Ears, nose, mouth, throat, and face: ( - ) mucositis, ( - ) sore throat Respiratory: ( - ) cough, ( - ) dyspnea, ( - ) wheezes Cardiovascular: ( - ) palpitation, ( - ) chest discomfort, ( - ) lower extremity swelling Gastrointestinal:  ( - ) nausea, ( - ) heartburn, ( - ) change in bowel habits Skin: ( - ) abnormal skin rashes Lymphatics: ( - )  new lymphadenopathy, ( - ) easy bruising Neurological: ( - ) numbness, ( - ) tingling, ( - ) new weaknesses Behavioral/Psych: ( - ) mood change, ( - ) new changes  All other systems were reviewed with the patient and are negative.  PHYSICAL EXAMINATION: ECOG PERFORMANCE STATUS: 1 - Symptomatic but completely ambulatory  Vitals:   08/01/21 1344  BP: (!) 132/42  Pulse: (!) 55  Resp: 16  Temp: 97.8 F (36.6 C)  SpO2: 99%   Filed Weights   08/01/21 1344  Weight: 172 lb 4.8 oz (78.2 kg)    GENERAL: well appearing male in NAD  SKIN: skin color, texture, turgor are normal, no rashes or significant lesions. Scattered bruising noted on forearms.  EYES: conjunctiva are pink and non-injected, sclera clear OROPHARYNX: no exudate, no erythema; lips, buccal mucosa, and tongue normal  LUNGS: clear to auscultation and  percussion with normal breathing effort HEART: regular rate & rhythm and no murmurs and no lower extremity edema Musculoskeletal: no cyanosis of digits and no clubbing  PSYCH: alert & oriented x 3, fluent speech NEURO: no focal motor/sensory deficits  LABORATORY DATA:  I have reviewed the data as listed CBC Latest Ref Rng & Units 08/01/2021 06/24/2021 06/05/2021  WBC 4.0 - 10.5 K/uL 9.5 8.8 8.7  Hemoglobin 13.0 - 17.0 g/dL 11.9(L) 11.6(L) 11.9(L)  Hematocrit 39.0 - 52.0 % 36.3(L) 34.1(L) 36.3(L)  Platelets 150 - 400 K/uL 311 310 297    CMP Latest Ref Rng & Units 06/24/2021 06/12/2021 04/21/2021  Glucose 65 - 99 mg/dL 89 101(H) 118(H)  BUN 8 - 27 mg/dL 21 31(H) 22  Creatinine 0.76 - 1.27 mg/dL 1.74(H) 1.54(H) 1.46(H)  Sodium 134 - 144 mmol/L 141 139 140  Potassium 3.5 - 5.2 mmol/L 5.1 5.0 4.6  Chloride 96 - 106 mmol/L 103 105 103  CO2 20 - 29 mmol/L 24 24 21   Calcium 8.6 - 10.2 mg/dL 9.5 9.7 9.4  Total Protein 6.0 - 8.5 g/dL - - -  Total Bilirubin 0.0 - 1.2 mg/dL - - -  Alkaline Phos 44 - 121 IU/L - - -  AST 0 - 40 IU/L - - -  ALT 0 - 44 IU/L - - -    ASSESSMENT & PLAN Ronnie Joseph is a 87 y.o. male who returns for follow-up for iron deficiency anemia.  #Iron deficiency anemia 2/2 chronic blood loss: -- Secondary to intermittent bleeding AVMs.  GI advised against repeat endoscopic procedures given his age. Recommend to proceed with supportive care.  -- Currently on ferrous sulfate 325 mg once daily.  Patient supplements with MiraLAX to minimize constipation. --Labs today show overall stable hemoglobin of 11.9, MCV 105.5.  Iron panel shows that serum iron is 109, iron saturation 31% and ferritin levels are stable at 27. --Recommend to continue with oral supplementation and return to the clinic in 3 months with repeat labs.   Orders Placed This Encounter  Procedures   CBC with Differential ([facility] Only)    Standing Status:   Future    Standing Expiration Date:   08/04/2022    Ferritin    Standing Status:   Future    Standing Expiration Date:   08/04/2022   Iron and TIBC    Standing Status:   Future    Standing Expiration Date:   08/04/2022    All questions were answered. The patient knows to call the clinic with any problems, questions or concerns.  I have spent a total of 25 minutes  minutes of face-to-face and non-face-to-face time, preparing to see the patient, performing a medically appropriate examination, counseling and educating the patient, ordering tests, documenting clinical information in the electronic health record, and care coordination.   Dede Query, PA-C Department of Hematology/Oncology [place] [facility] Phone: [phone]

## 2021-08-03 ENCOUNTER — Encounter: Payer: Self-pay | Admitting: Orthopedic Surgery

## 2021-08-04 MED ORDER — MONTELUKAST SODIUM 10 MG PO TABS
10.0000 mg | ORAL_TABLET | Freq: Every day | ORAL | 1 refills | Status: DC
Start: 2021-08-04 — End: 2021-11-03

## 2021-08-04 MED ORDER — METOPROLOL SUCCINATE ER 25 MG PO TB24: 25.0000 mg | ORAL_TABLET | Freq: Every day | ORAL | 1 refills | Status: DC

## 2021-08-07 ENCOUNTER — Other Ambulatory Visit: Payer: Self-pay | Admitting: Orthopedic Surgery

## 2021-08-07 DIAGNOSIS — I5042 Chronic combined systolic (congestive) and diastolic (congestive) heart failure: Secondary | ICD-10-CM

## 2021-08-07 MED ORDER — FUROSEMIDE 20 MG PO TABS: 20.0000 mg | ORAL_TABLET | ORAL | 3 refills | Status: DC | PRN

## 2021-09-18 NOTE — Progress Notes (Signed)
Cardiology Office Note:    Date:  09/21/2021   ID:  Ronnie Joseph, DOB [birth year], MRN [id number]  PCP:  [name], NP  Cardiologist:  None  Electrophysiologist:  Vickie Epley, MD   Referring MD: [name], NP   Chief Complaint  Patient presents with   Congestive Heart Failure      History of Present Illness:    Ronnie Joseph is a 87 y.o. male with a hx of CAD status post PCI to LAD and LCx in 05/2019, ischemic cardiomyopathy (EF 25% post PCI, improved to 35-40 % on most recent echo), atrial fibrillation on Eliquis who presents for an initial visit.  His previous cardiologist was [name] in [facility]  Echocardiogram 09/26/2020 in [place] showed LVEF 35 to 40%, mildly reduced RV systolic function.  Cardiac catheterization on 06/01/2019 showed tandem diffuse 90% stenosis of mid LAD status post DES, 95% distal LCx stenosis status post DES.  Echocardiogram on 05/26/2019 showed LVEF 25 to 30%.  He was admitted from 12/16 through 10/02/20 with NSTEMI in [place].  He presented to ED with chest pain.  Was also found to have fever to 100.8, lactic acid of 12, hemoglobin 7.5.  Troponin peaked at 8300.  He received 2 units PRBCs.  He was evaluated by cardiology, who recommended medical management.  He has a history of GI bleeds with AVMs.  Underwent EGD/colonoscopy on 09/30/20 which showed duodenal AVMs status post APC and colonoscopy showed ascending colon AVM status post APC.  During admission was tolerating Plavix and apixaban without any further bleeding.  He was started on amiodarone for his atrial fibrillation, with plans for amiodarone 200 mg twice daily x1 month then 200 mg daily.  Echo 07/15/2021 showed EF 40 to 45%, normal RV function, mild MR, mild AI.  Since last clinic visit, he reports that he is doing well.  Denies any chest pain, dyspnea, lightheadedness, syncope, or palpitations.  Does report intermittent right ankle edema.   Wt Readings from Last 3  Encounters:  09/19/21 166 lb (75.3 kg)  08/01/21 172 lb 4.8 oz (78.2 kg)  06/24/21 172 lb 3.2 oz (78.1 kg)    Past Medical History:  Diagnosis Date   Acute anemia 09/26/2020   Anemia due to acute blood loss 05/25/2019   Chronic maxillary sinusitis 08/06/2017   Coronary artery disease due to lipid rich plaque 07/27/2019   Essential hypertension 02/14/2003   Fainting 05/30/2019   Fall 05/25/1999   High cholesterol 03/14/2001   History of basal cell carcinoma 11/02/2019   History of pneumonia    History of snoring    History of upper respiratory infection    Irregular heartbeat 08/06/2003   Ischemic cardiomyopathy 05/30/2019   Mild cognitive impairment 05/15/2019   NSTEMI (non-ST elevated myocardial infarction) ([place]) 06/03/2019   Poor renal function 03/19/2005   Primary osteoarthritis of shoulder 10/24/2015   Psoriasis 12/29/2004   Rash and nonspecific skin eruption 04/30/2020   Schatzki's ring 08/07/2020   Sepsis with acute hypoxic respiratory failure ([place]) 09/26/2020   Sleep apnea 08/13/2003    Past Surgical History:  Procedure Laterality Date   APPENDECTOMY     CATARACT EXTRACTION     COLONOSCOPY     HERNIA REPAIR     RIGHT HEART CATHETERIZATION WITH ADENOSINE STUDY     UPPER GI ENDOSCOPY      Current Medications: Current Meds  Medication Sig   acetaminophen (TYLENOL) 500 MG tablet Take 500 mg by mouth 2 (two)  times daily.   amiodarone (PACERONE) 200 MG tablet Take 1 tablet (200 mg total) by mouth daily.   apixaban (ELIQUIS) 2.5 MG TABS tablet Take 1 tablet (2.5 mg total) by mouth 2 (two) times daily.   aspirin EC 81 MG tablet Take 1 tablet (81 mg total) by mouth daily. Swallow whole.   bisacodyl (DULCOLAX) 5 MG EC tablet Take 5 mg by mouth daily as needed for moderate constipation.   CALCIUM CITRATE PO Take 1 tablet by mouth 2 (two) times daily.   empagliflozin (JARDIANCE) 10 MG TABS tablet Take 1 tablet (10 mg total) by mouth daily before breakfast.   ferrous  sulfate 325 (65 FE) MG tablet Take 325 mg by mouth daily.   fluticasone (FLONASE) 50 MCG/ACT nasal spray Place 2 sprays into both nostrils 2 (two) times daily.   fluticasone furoate-vilanterol (BREO ELLIPTA) 100-25 MCG/INH AEPB Inhale 1 puff into the lungs daily.   furosemide (LASIX) 20 MG tablet Take 20 mg by mouth.   levocetirizine (XYZAL) 5 MG tablet Take 1 tablet (5 mg total) by mouth daily.   levothyroxine (SYNTHROID) 88 MCG tablet Take 1 tablet (88 mcg total) by mouth daily.   losartan (COZAAR) 50 MG tablet Take 1 tablet (50 mg total) by mouth in the morning.   montelukast (SINGULAIR) 10 MG tablet Take 1 tablet (10 mg total) by mouth daily.   Multiple Vitamin (MULTIVITAMIN PO) Take 1 tablet by mouth daily.   naltrexone (DEPADE) 50 MG tablet 1/2 tablet   pantoprazole (PROTONIX) 40 MG tablet Take 1 tablet (40 mg total) by mouth daily.   polyethylene glycol (MIRALAX / GLYCOLAX) 17 g packet Take 17 g by mouth daily as needed.   rosuvastatin (CRESTOR) 20 MG tablet Take 1 tablet (20 mg total) by mouth daily.   Saw Palmetto, Serenoa repens, (SAW PALMETTO FRUIT PO) Take 450 mg by mouth 2 (two) times daily.   senna-docusate (SENOKOT-S) 8.6-50 MG tablet Take 1 tablet by mouth 2 (two) times daily.   spironolactone (ALDACTONE) 25 MG tablet Take 0.5 tablets (12.5 mg total) by mouth daily.   triamcinolone cream (KENALOG) 0.1 % Apply topically daily.   [DISCONTINUED] metoprolol succinate (TOPROL-XL) 25 MG 24 hr tablet Take 1 tablet (25 mg total) by mouth daily.     Allergies:   Lisinopril, Sulfamethoxazole-trimethoprim, Telmisartan, Atorvastatin, and Sulfa antibiotics   Social History   Socioeconomic History   Marital status: Widowed    Spouse name: Not on file   Number of children: Not on file   Years of education: Not on file   Highest education level: Not on file  Occupational History   Not on file  Tobacco Use   Smoking status: Former    Packs/day: 0.25    Types: Cigarettes    Quit  date: 12/17/1958    Years since quitting: 62.8   Smokeless tobacco: Never  Vaping Use   Vaping Use: Never used  Substance and Sexual Activity   Alcohol use: Yes    Alcohol/week: 3.0 standard drinks    Types: 3 Standard drinks or equivalent per week   Drug use: Never   Sexual activity: Not Currently  Other Topics Concern   Not on file  Social History Narrative   Tobacco use, amount per day now: 0   Past tobacco use, amount per day: Less than 1 pack   How many years did you use tobacco: 5 years, stopped in 1960   Alcohol use (drinks per week): 4   Diet: N/A  Do you drink/eat things with caffeine: Yes.   Marital status: Widowed                                  What year were you married? 1960   Do you live in a house, apartment, assisted living, condo, trailer, etc.? Assisted Living.   Is it one or more stories? 1    How many persons live in your home? 1   Do you have pets in your home?( please list) No.   Highest Level of education completed? College   Current or past profession: Pharmacist   Do you exercise?  No.                                Type and how often?   Do you have a living will? Yes.   Do you have a DNR form?  Yes.                                 If not, do you want to discuss one?   Do you have signed POA/HPOA forms? Yes.                       If so, please bring to you appointment      Do you have any difficulty bathing or dressing yourself? Yes.   Do you have any difficulty preparing food or eating? Yes.   Do you have any difficulty managing your medications? Yes.   Do you have any difficulty managing your finances? No.   Do you have any difficulty affording your medications? No.   Social Determinants of Health   Financial Resource Strain: Not on file  Food Insecurity: Not on file  Transportation Needs: Not on file  Physical Activity: Not on file  Stress: Not on file  Social Connections: Not on file     Family History: The patient's family history  includes Brain cancer in his brother; Breast cancer in his daughter; Celiac disease in his daughter; Colon cancer in an other family member; Colon polyps in an other family member; Diabetes in his son; Diabetes Mellitus I in his son; Esophageal cancer in an other family member; Heart attack in his father; Heart disease in his father; Lung disease in his mother; Pancreatic cancer in an other family member; Stomach cancer in an other family member.  ROS:   Please see the history of present illness.    All other systems reviewed and are negative.  EKGs/Labs/Other Studies Reviewed:    The following studies were reviewed today:   EKG:    09/19/21: Sinus bradycardia, rate 47, first-degree AV block, right bundle branch block, left anterior fascicular block, Q waves in inferior leads and V5/6 9/22-sinus bradycardia, rate 52, first-degree AV block, right bundle branch block, Q waves in inferior leads and lateral leads 6/22- The EKG ordered demonstrates Sinus rhythm, rate 59, non specific  intraventricular  conduction delay, first degree aV block, poor R wave progression, Q waves in inferior leads,  4/22- EKG was not ordered today 2/22- sinus bradycardia, rate 58, first-degree AV block, inferior Q waves, RBBB  Recent Labs: 12/10/2020: Brain Natriuretic Peptide 361 03/13/2021: ALT 11; TSH 1.880 06/24/2021: Magnesium 2.6 09/19/2021: BUN 19; Creatinine, Ser 1.75; Hemoglobin 12.8; Platelets 307; Potassium  5.7; Sodium 145  Recent Lipid Panel    Component Value Date/Time   CHOL 147 12/10/2020 0000   TRIG 176 (H) 12/10/2020 0000   HDL 51 12/10/2020 0000   CHOLHDL 2.9 12/10/2020 0000   LDLCALC 71 12/10/2020 0000    Physical Exam:    VS:  BP 134/61   Pulse (!) 47   Ht 5\' 8"  (1.727 m)   Wt 166 lb (75.3 kg)   SpO2 97%   BMI 25.24 kg/m     Wt Readings from Last 3 Encounters:  09/19/21 166 lb (75.3 kg)  08/01/21 172 lb 4.8 oz (78.2 kg)  06/24/21 172 lb 3.2 oz (78.1 kg)     GEN:  in no acute  distress HEENT: Normal NECK: No JVD CARDIAC: RRR, 2/6 systolic murmur RESPIRATORY:  Clear to auscultation without rales, wheezing or rhonchi  ABDOMEN: Soft, non-tender, non-distended MUSCULOSKELETAL:  Trace edema in bilateral LE; No deformity  SKIN: Warm and dry NEUROLOGIC:  Alert and oriented x 3 PSYCHIATRIC:  Normal affect   ASSESSMENT:    1. CAD in native artery   2. Bradycardia   3. Chronic combined systolic and diastolic heart failure ([place])   4. Atrial fibrillation, unspecified type ([place])   5. Essential hypertension   6. Hyperlipidemia, unspecified hyperlipidemia type      PLAN:    CAD: Cardiac catheterization on 06/01/2019 showed tandem diffuse 90% stenosis of mid LAD status post DES, 95% distal LCx stenosis status post DES.  Admitted in [place] with NSTEMI 09/2020.  In setting of GI bleed, medical management was recommended. -Continue aspirin plus Eliquis.   -Continue rosuvastatin 20 mg daily  Sinus bradycardia: Asymptomatic, rate in 40s in clinic today.  Will discontinue metoprolol   Chronic combined systolic and diastolic heart failure: EF 35-40% on echo 09/2020.  Echo 07/15/2021 showed EF 40 to 45%, normal RV function, mild MR, mild AI.   -Discontinue Toprol-XL due to bradycardia as above -Continue losartan 50 mg daily -Continue Jardiance 10 mg daily -Continue spironolactone 12.5 mg daily -Appears euvolemic.  Continue Lasix 20 mg daily as needed for weight gain greater than 3 pounds in 1 day or 5 pounds in 1 week -Check BMET   Atrial fibrillation: Currently in sinus rhythm.  CHA2DS2-VASc score 5 (CHF, hypertension, age x2, CAD) -Continue amiodarone 200 mg daily -Discontinue Toprol-XL due to bradycardia as above -Continue Eliquis 2.5 mg BID given age over 4 and creatinine greater than 1.5.  Denies any recent bleeding. -While he has an elevated CHA2DS2-VASc score, he is a high risk anticoagulation candidate given his history of GI bleeding and falls.   Referred to Dr. Quentin Ore for [facility] evaluation, recommended monitoring for now, if have further bleeding consider [facility]   Anemia: Had GI bleed, has iron deficiency anemia.    Denies any recent bleeding, reports difficult to tell given black-colored stools on iron supplements.  Reports has been having significant issues with constipation on p.o. iron.  Referred to GI for evaluation.  Has intermittently bleeding AVMs, GI advised against repeat endoscopic procedures given his age.  Was referred to  hematology, on p.o. iron   Hypertension: On Toprol-XL, spironolactone, and losartan.  Appears controlled   Hyperlipidemia: On rosuvastatin 20 mg daily.  LDL 71 on 12/10/2020   Hypothyroidism: TSH has been elevated, PCP adjusting levothyroxine dose.  TSH 03/13/21 was normal   RTC in 3 months     Medication Adjustments/Labs and Tests Ordered: Current medicines are reviewed at length with the patient  today.  Concerns regarding medicines are outlined above.  Orders Placed This Encounter  Procedures   CBC   Basic metabolic panel   EKG 56-FBPP     No orders of the defined types were placed in this encounter.    Patient Instructions  Medication Instructions:  STOP metoprolol succinate  *If you need a refill on your cardiac medications before your next appointment, please call your pharmacy*   Lab Work: BMET, CBC today   If you have labs (blood work) drawn today and your tests are completely normal, you will receive your results only by: [place] (if you have MyChart) OR A paper copy in the mail If you have any lab test that is abnormal or we need to change your treatment, we will call you to review the results.   Testing/Procedures: NONE   Follow-Up: At [facility], you and your health needs are our priority.  As part of our continuing mission to provide you with exceptional heart care, we have created designated Provider Care Teams.  These Care Teams include your primary  Cardiologist (physician) and Advanced Practice Providers (APPs -  Physician Assistants and Nurse Practitioners) who all work together to provide you with the care you need, when you need it.  We recommend signing up for the patient portal called "MyChart".  Sign up information is provided on this After Visit Summary.  MyChart is used to connect with patients for Virtual Visits (Telemedicine).  Patients are able to view lab/test results, encounter notes, upcoming appointments, etc.  Non-urgent messages can be sent to your provider as well.   To learn more about what you can do with MyChart, go to [email or web address].    Your next appointment:   3 month(s)  The format for your next appointment:   In Person  Provider:   Dr. Gardiner Rhyme      Signed, Donato Heinz, MD  09/21/2021 8:38 PM    [place]

## 2021-09-19 ENCOUNTER — Encounter: Payer: Self-pay | Admitting: Cardiology

## 2021-09-19 ENCOUNTER — Other Ambulatory Visit: Payer: Self-pay

## 2021-09-19 ENCOUNTER — Ambulatory Visit (INDEPENDENT_AMBULATORY_CARE_PROVIDER_SITE_OTHER): Payer: Medicare Other | Admitting: Cardiology

## 2021-09-19 VITALS — BP 134/61 | HR 47 | Ht 68.0 in | Wt 166.0 lb

## 2021-09-19 DIAGNOSIS — I10 Essential (primary) hypertension: Secondary | ICD-10-CM

## 2021-09-19 DIAGNOSIS — I4891 Unspecified atrial fibrillation: Secondary | ICD-10-CM | POA: Diagnosis not present

## 2021-09-19 DIAGNOSIS — R001 Bradycardia, unspecified: Secondary | ICD-10-CM

## 2021-09-19 DIAGNOSIS — I5042 Chronic combined systolic (congestive) and diastolic (congestive) heart failure: Secondary | ICD-10-CM

## 2021-09-19 DIAGNOSIS — I2510 Atherosclerotic heart disease of native coronary artery without angina pectoris: Secondary | ICD-10-CM

## 2021-09-19 DIAGNOSIS — E785 Hyperlipidemia, unspecified: Secondary | ICD-10-CM

## 2021-09-19 NOTE — Patient Instructions (Signed)
Medication Instructions:  STOP metoprolol succinate  *If you need a refill on your cardiac medications before your next appointment, please call your pharmacy*   Lab Work: BMET, CBC today   If you have labs (blood work) drawn today and your tests are completely normal, you will receive your results only by: [place] (if you have MyChart) OR A paper copy in the mail If you have any lab test that is abnormal or we need to change your treatment, we will call you to review the results.   Testing/Procedures: NONE   Follow-Up: At [facility], you and your health needs are our priority.  As part of our continuing mission to provide you with exceptional heart care, we have created designated Provider Care Teams.  These Care Teams include your primary Cardiologist (physician) and Advanced Practice Providers (APPs -  Physician Assistants and Nurse Practitioners) who all work together to provide you with the care you need, when you need it.  We recommend signing up for the patient portal called "MyChart".  Sign up information is provided on this After Visit Summary.  MyChart is used to connect with patients for Virtual Visits (Telemedicine).  Patients are able to view lab/test results, encounter notes, upcoming appointments, etc.  Non-urgent messages can be sent to your provider as well.   To learn more about what you can do with MyChart, go to [email or web address].    Your next appointment:   3 month(s)  The format for your next appointment:   In Person  Provider:   Dr. Gardiner Rhyme

## 2021-09-20 ENCOUNTER — Other Ambulatory Visit: Payer: Self-pay | Admitting: Cardiology

## 2021-09-20 DIAGNOSIS — I5042 Chronic combined systolic (congestive) and diastolic (congestive) heart failure: Secondary | ICD-10-CM

## 2021-09-20 LAB — BASIC METABOLIC PANEL
BUN/Creatinine Ratio: 11 (ref 10–24)
BUN: 19 mg/dL (ref 8–27)
CO2: 25 mmol/L (ref 20–29)
Calcium: 9.8 mg/dL (ref 8.6–10.2)
Chloride: 104 mmol/L (ref 96–106)
Creatinine, Ser: 1.75 mg/dL — ABNORMAL HIGH (ref 0.76–1.27)
Glucose: 79 mg/dL (ref 70–99)
Potassium: 5.7 mmol/L — ABNORMAL HIGH (ref 3.5–5.2)
Sodium: 145 mmol/L — ABNORMAL HIGH (ref 134–144)
eGFR: 37 mL/min/{1.73_m2} — ABNORMAL LOW (ref >59)

## 2021-09-20 LAB — CBC
Hematocrit: 38.5 % (ref 37.5–51.0)
Hemoglobin: 12.8 g/dL — ABNORMAL LOW (ref 13.0–17.7)
MCH: 34.7 pg — ABNORMAL HIGH (ref 26.6–33.0)
MCHC: 33.2 g/dL (ref 31.5–35.7)
MCV: 104 fL — ABNORMAL HIGH (ref 79–97)
Platelets: 307 10*3/uL (ref 150–450)
RBC: 3.69 x10E6/uL — ABNORMAL LOW (ref 4.14–5.80)
RDW: 11.8 % (ref 11.6–15.4)
WBC: 7.9 10*3/uL (ref 3.4–10.8)

## 2021-09-22 ENCOUNTER — Encounter: Payer: Self-pay | Admitting: Cardiology

## 2021-09-23 ENCOUNTER — Other Ambulatory Visit: Payer: Self-pay | Admitting: Orthopedic Surgery

## 2021-09-23 DIAGNOSIS — E039 Hypothyroidism, unspecified: Secondary | ICD-10-CM

## 2021-09-24 ENCOUNTER — Other Ambulatory Visit: Payer: Self-pay

## 2021-09-24 DIAGNOSIS — I5042 Chronic combined systolic (congestive) and diastolic (congestive) heart failure: Secondary | ICD-10-CM

## 2021-09-25 ENCOUNTER — Other Ambulatory Visit: Payer: Self-pay | Admitting: Orthopedic Surgery

## 2021-09-25 ENCOUNTER — Encounter: Payer: Self-pay | Admitting: Orthopedic Surgery

## 2021-09-25 DIAGNOSIS — E039 Hypothyroidism, unspecified: Secondary | ICD-10-CM

## 2021-09-25 LAB — BASIC METABOLIC PANEL
BUN/Creatinine Ratio: 10 (ref 10–24)
BUN: 17 mg/dL (ref 8–27)
CO2: 24 mmol/L (ref 20–29)
Calcium: 9.3 mg/dL (ref 8.6–10.2)
Chloride: 100 mmol/L (ref 96–106)
Creatinine, Ser: 1.66 mg/dL — ABNORMAL HIGH (ref 0.76–1.27)
Glucose: 95 mg/dL (ref 70–99)
Potassium: 5.1 mmol/L (ref 3.5–5.2)
Sodium: 143 mmol/L (ref 134–144)
eGFR: 40 mL/min/{1.73_m2} — ABNORMAL LOW (ref >59)

## 2021-09-25 MED ORDER — ROSUVASTATIN CALCIUM 20 MG PO TABS
20.0000 mg | ORAL_TABLET | Freq: Every day | ORAL | 0 refills | Status: AC
Start: 2021-09-25 — End: ?

## 2021-09-25 MED ORDER — LEVOTHYROXINE SODIUM 88 MCG PO TABS: 88.0000 ug | ORAL_TABLET | Freq: Every day | ORAL | 0 refills | Status: DC

## 2021-09-25 NOTE — Telephone Encounter (Signed)
Patient daughter requested due to [facility] shipment has not been received yet.   Pended Rx's and sent to Amy for approval due to [place].

## 2021-09-26 ENCOUNTER — Encounter: Payer: Self-pay | Admitting: Cardiology

## 2021-09-26 NOTE — Telephone Encounter (Signed)
Would discontinue the spironolactone

## 2021-10-02 ENCOUNTER — Other Ambulatory Visit: Payer: Self-pay | Admitting: *Deleted

## 2021-10-02 DIAGNOSIS — E039 Hypothyroidism, unspecified: Secondary | ICD-10-CM

## 2021-10-02 MED ORDER — LEVOTHYROXINE SODIUM 88 MCG PO TABS: 88.0000 ug | ORAL_TABLET | Freq: Every day | ORAL | 1 refills | Status: DC

## 2021-10-02 NOTE — Telephone Encounter (Signed)
[facility] called and stated that patient needs a refill on his medication. Stated that they sent a refill request and haven't had a response. Patient uses this pharmacy and has been waiting on Rx.

## 2021-10-13 ENCOUNTER — Other Ambulatory Visit: Payer: Self-pay

## 2021-10-13 ENCOUNTER — Emergency Department (HOSPITAL_BASED_OUTPATIENT_CLINIC_OR_DEPARTMENT_OTHER): Payer: Medicare Other | Admitting: Radiology

## 2021-10-13 ENCOUNTER — Encounter (HOSPITAL_BASED_OUTPATIENT_CLINIC_OR_DEPARTMENT_OTHER): Payer: Self-pay | Admitting: Emergency Medicine

## 2021-10-13 ENCOUNTER — Emergency Department (HOSPITAL_BASED_OUTPATIENT_CLINIC_OR_DEPARTMENT_OTHER): Payer: Medicare Other

## 2021-10-13 ENCOUNTER — Emergency Department (HOSPITAL_BASED_OUTPATIENT_CLINIC_OR_DEPARTMENT_OTHER)
Admission: EM | Admit: 2021-10-13 | Discharge: 2021-10-13 | Disposition: A | Discharge status: Home / Self Care | Payer: Medicare Other | Attending: Emergency Medicine | Admitting: Emergency Medicine

## 2021-10-13 DIAGNOSIS — Z7951 Long term (current) use of inhaled steroids: Secondary | ICD-10-CM | POA: Diagnosis not present

## 2021-10-13 DIAGNOSIS — Z7901 Long term (current) use of anticoagulants: Secondary | ICD-10-CM | POA: Insufficient documentation

## 2021-10-13 DIAGNOSIS — M7989 Other specified soft tissue disorders: Secondary | ICD-10-CM

## 2021-10-13 DIAGNOSIS — Z79899 Other long term (current) drug therapy: Secondary | ICD-10-CM | POA: Diagnosis not present

## 2021-10-13 DIAGNOSIS — Z7982 Long term (current) use of aspirin: Secondary | ICD-10-CM | POA: Diagnosis not present

## 2021-10-13 LAB — CBC
HCT: 36.9 % — ABNORMAL LOW (ref 39.0–52.0)
Hemoglobin: 12 g/dL — ABNORMAL LOW (ref 13.0–17.0)
MCH: 34.2 pg — ABNORMAL HIGH (ref 26.0–34.0)
MCHC: 32.5 g/dL (ref 30.0–36.0)
MCV: 105.1 fL — ABNORMAL HIGH (ref 80.0–100.0)
Platelets: 357 10*3/uL (ref 150–400)
RBC: 3.51 MIL/uL — ABNORMAL LOW (ref 4.22–5.81)
RDW: 13.2 % (ref 11.5–15.5)
WBC: 8.4 10*3/uL (ref 4.0–10.5)
nRBC: 0 % (ref 0.0–0.2)

## 2021-10-13 LAB — BASIC METABOLIC PANEL
Anion gap: 8 (ref 5–15)
BUN: 15 mg/dL (ref 8–23)
CO2: 27 mmol/L (ref 22–32)
Calcium: 9.3 mg/dL (ref 8.9–10.3)
Chloride: 105 mmol/L (ref 98–111)
Creatinine, Ser: 1.15 mg/dL (ref 0.61–1.24)
GFR, Estimated: >60 mL/min (ref >60)
Glucose, Bld: 98 mg/dL (ref 70–99)
Potassium: 4.2 mmol/L (ref 3.5–5.1)
Sodium: 140 mmol/L (ref 135–145)

## 2021-10-13 NOTE — ED Triage Notes (Signed)
Pt arrives to ED with c/o left leg swelling. This started yesterday. Pt denies pain in the foot/calf. Pt does report a fall x1 week ago where he landed on the left knee.

## 2021-10-13 NOTE — Discharge Instructions (Signed)
Your lab work shows normal potassium and improved kidney function today.  The ultrasound of your leg does not demonstrate any signs of blood clot or other problems.  The x-ray of your knee shows arthritis but no acute injuries or fractures.  Please take 20 mg of furosemide daily in the morning over the next 5 days and keep your leg elevated when possible to help the swelling decrease.  Use Tylenol as needed for knee pain.  Please follow-up with your primary care doctor or cardiologist if you continue to have persistent pain or swelling in the leg.

## 2021-10-13 NOTE — ED Provider Notes (Signed)
[facility] EMERGENCY DEPT Provider Note   CSN: [id number] Arrival date & time: 10/13/21  1709     History  Chief Complaint  Patient presents with   Leg Swelling    Ronnie Joseph is a 88 y.o. male.  Patient presents the emergency department for evaluation of left lower extremity swelling.  Patient states that he had a fall about a week ago.  He sustained an abrasion to his left knee area.  He also has some bruising to the right lower leg which may have occurred around the same time.  Patient is anticoagulated.  Yesterday patient developed swelling in the left lower extremity.  He has had knee pain since his fall that is worse with certain positions, but otherwise his leg does not hurt.  He does not have a history of gout or DVT.  He has been on Lasix in the past however this was recently changed to an as needed medication.  No chest pain or shortness of breath.  No fevers or chills.  Onset of symptoms acute.  Course is constant.      Home Medications Prior to Admission medications   Medication Sig Start Date End Date Taking? Authorizing Provider  acetaminophen (TYLENOL) 500 MG tablet Take 500 mg by mouth 2 (two) times daily.    [provider]  amiodarone (PACERONE) 200 MG tablet Take 1 tablet (200 mg total) by mouth daily. 07/08/21   Donato Heinz, MD  apixaban (ELIQUIS) 2.5 MG TABS tablet Take 1 tablet (2.5 mg total) by mouth 2 (two) times daily. 07/04/21   Donato Heinz, MD  aspirin EC 81 MG tablet Take 1 tablet (81 mg total) by mouth daily. Swallow whole. 01/10/21   Donato Heinz, MD  bisacodyl (DULCOLAX) 5 MG EC tablet Take 5 mg by mouth daily as needed for moderate constipation.    [provider]  CALCIUM CITRATE PO Take 1 tablet by mouth 2 (two) times daily.    [provider]  empagliflozin (JARDIANCE) 10 MG TABS tablet Take 1 tablet (10 mg total) by mouth daily before breakfast. 07/08/21   Donato Heinz,  MD  ferrous sulfate 325 (65 FE) MG tablet Take 325 mg by mouth daily.    [provider]  fluticasone (FLONASE) 50 MCG/ACT nasal spray Place 2 sprays into both nostrils 2 (two) times daily.    [provider]  fluticasone furoate-vilanterol (BREO ELLIPTA) 100-25 MCG/INH AEPB Inhale 1 puff into the lungs daily. 07/03/21   Fargo, Amy E, NP  furosemide (LASIX) 20 MG tablet Take 20 mg by mouth.    [provider]  levocetirizine (XYZAL) 5 MG tablet Take 1 tablet (5 mg total) by mouth daily. 07/03/21   Fargo, Amy E, NP  levothyroxine (SYNTHROID) 88 MCG tablet Take 1 tablet (88 mcg total) by mouth daily. 10/02/21   Fargo, Amy E, NP  losartan (COZAAR) 50 MG tablet Take 1 tablet (50 mg total) by mouth in the morning. 07/29/21   Donato Heinz, MD  montelukast (SINGULAIR) 10 MG tablet Take 1 tablet (10 mg total) by mouth daily. 08/04/21   Fargo, Amy E, NP  Multiple Vitamin (MULTIVITAMIN PO) Take 1 tablet by mouth daily.    [provider]  naltrexone (DEPADE) 50 MG tablet 1/2 tablet  12/15/21  [provider]  pantoprazole (PROTONIX) 40 MG tablet Take 1 tablet (40 mg total) by mouth daily. 07/03/21   [name], NP  polyethylene glycol (MIRALAX / GLYCOLAX)  17 g packet Take 17 g by mouth daily as needed.    [provider]  rosuvastatin (CRESTOR) 20 MG tablet Take 1 tablet (20 mg total) by mouth daily. 09/25/21   [name], NP  Saw Palmetto, Serenoa repens, (SAW PALMETTO FRUIT PO) Take 450 mg by mouth 2 (two) times daily.    [provider]  senna-docusate (SENOKOT-S) 8.6-50 MG tablet Take 1 tablet by mouth 2 (two) times daily.    [provider]  triamcinolone cream (KENALOG) 0.1 % Apply topically daily. 06/12/21   [provider]      Allergies    Lisinopril, Sulfamethoxazole-trimethoprim, Telmisartan, Atorvastatin, and Sulfa antibiotics    Review of Systems   Review of Systems  Constitutional:  Negative for  fever.  Respiratory:  Negative for shortness of breath.   Cardiovascular:  Positive for leg swelling. Negative for chest pain.  Gastrointestinal:  Negative for nausea and vomiting.  Musculoskeletal:  Negative for myalgias.  Skin:  Positive for wound. Negative for color change and rash.  Neurological:  Negative for headaches.   Physical Exam Updated Vital Signs BP (!) 160/55 (BP Location: Right Arm)    Pulse 62    Temp 97.8 F (36.6 C)    Resp 16    Ht 5\' 8"  (1.727 m)    Wt 75.3 kg    SpO2 97%    BMI 25.24 kg/m  Physical Exam Vitals and nursing note reviewed.  Constitutional:      Appearance: He is well-developed.  HENT:     Head: Normocephalic and atraumatic.  Eyes:     Conjunctiva/sclera: Conjunctivae normal.  Cardiovascular:     Pulses:          Dorsalis pedis pulses are 1+ on the right side and 1+ on the left side.  Pulmonary:     Effort: No respiratory distress.  Musculoskeletal:     Cervical back: Normal range of motion and neck supple.     Right lower leg: Edema present.     Left lower leg: Edema present.     Comments: Left lower extremity: There is an abrasion over the left patellar area without signs of surrounding infection or cellulitis.  There is 2+ pitting edema to the left lower leg to the shin.  Mild venous stasis changes noted distally.  1+ DP pulse present.  Right lower extremity: There are some diffuse ecchymosis over the lower leg proximally.  There is 1+ pitting edema noted to the mid ankle.  Mild venous stasis changes noted distally.  1+ DP pulse present.  Skin:    General: Skin is warm and dry.  Neurological:     Mental Status: He is alert.    ED Results / Procedures / Treatments   Labs (all labs ordered are listed, but only abnormal results are displayed) Labs Reviewed  CBC - Abnormal; Notable for the following components:      Result Value   RBC 3.51 (*)    Hemoglobin 12.0 (*)    HCT 36.9 (*)    MCV 105.1 (*)    MCH 34.2 (*)    All other  components within normal limits  BASIC METABOLIC PANEL    EKG None  Radiology US Venous Img Lower Unilateral Left  Result Date: 10/13/2021 CLINICAL DATA:  Left leg swelling. Lower extremity edema. Fall 1 week ago. EXAM: LEFT LOWER EXTREMITY VENOUS DOPPLER ULTRASOUND TECHNIQUE: Gray-scale sonography with compression, as well as color and duplex ultrasound, were performed to evaluate  the deep venous system(s) from the level of the common femoral vein through the popliteal and proximal calf veins. COMPARISON:  None. FINDINGS: VENOUS Normal compressibility of the common femoral, superficial femoral, and popliteal veins, as well as the visualized calf veins. Visualized portions of profunda femoral vein and great saphenous vein unremarkable. No filling defects to suggest DVT on grayscale or color Doppler imaging. Doppler waveforms show normal direction of venous flow, normal respiratory plasticity and response to augmentation. Limited views of the contralateral common femoral vein are unremarkable. OTHER None. Limitations: none IMPRESSION: No evidence of left lower extremity DVT. Electronically Signed   By: Keith Rake M.D.   On: 10/13/2021 18:48   DG Knee Complete 4 Views Left  Result Date: 10/13/2021 CLINICAL DATA:  Knee swelling.  Fall 1 week ago. EXAM: LEFT KNEE - COMPLETE 4+ VIEW COMPARISON:  None. FINDINGS: No evidence of fracture, dislocation, or joint effusion. Moderate tricompartmental osteoarthritis. Findings of prominent in the medial tibiofemoral compartment where there is joint space narrowing. Tricompartmental peripheral spurring. Small quadriceps tendon enthesophyte. Prepatellar soft tissue edema. Advanced arterial vascular calcifications are seen. IMPRESSION: 1. Prepatellar soft tissue edema. No acute fracture or dislocation. 2. Moderate tricompartmental osteoarthritis. 3. Advanced arterial vascular calcifications. Electronically Signed   By: Keith Rake M.D.   On: 10/13/2021 18:18     Procedures Procedures    Medications Ordered in ED Medications - No data to display  ED Course/ Medical Decision Making/ A&P    Patient seen and examined.  Results discussed with patient and family.   Labs: CBC and BMP ordered in triage show baseline blood counts and improved kidney function from previous, potassium is normal.  Imaging: DVT study negative  Medications/Fluids: Patient instructed to start taking 20 mg of furosemide tomorrow for the next 5 days to help with swelling.    Vital signs reviewed and are as follows: BP (!) 160/55 (BP Location: Right Arm)    Pulse 62    Temp 97.8 F (36.6 C)    Resp 16    Ht 5\' 8"  (1.727 m)    Wt 75.3 kg    SpO2 97%    BMI 25.24 kg/m   Initial impression: Left lower extremity swelling, knee pain  Plan: Tylenol for pain, elevation of the leg, Lasix.  Encouraged PCP or cardiology follow-up with persistent swelling.  Return to the emergency department for worsening or uncontrolled pain, redness, fever.                          Medical Decision Making  Patient presents with worse than baseline left lower extremity swelling, isolated when compared to the right.  Patient does have history of lower extremity swelling.  He had a fall about a week ago.  He has had some knee pain but otherwise no pain in the leg.  Mildly decreased pulses but present in the bilateral feet, symmetric.  Patient evaluated with Doppler ultrasound which did not demonstrate any DVT.  He is on anticoagulation chronically.  No concern for significant heart failure, liver disease, renal failure.  Labs are reassuring, actually improved from previous.  Plan as above.  No signs of cellulitis or infection.  Low concern for occult fracture.        Final Clinical Impression(s) / ED Diagnoses Final diagnoses:  Swelling of left lower extremity    Rx / DC Orders ED Discharge Orders     None  Carlisle Cater, PA-C 10/13/21 2101    Blanchie Dessert,  MD 10/15/21 1228

## 2021-10-14 ENCOUNTER — Telehealth: Payer: Self-pay | Admitting: *Deleted

## 2021-10-14 ENCOUNTER — Other Ambulatory Visit: Payer: Self-pay | Admitting: Orthopedic Surgery

## 2021-10-14 MED ORDER — FUROSEMIDE 20 MG PO TABS: 20.0000 mg | ORAL_TABLET | Freq: Every day | ORAL | 0 refills | Status: DC

## 2021-10-14 NOTE — Telephone Encounter (Signed)
Nurse with [facility] called and stated that patient went to Urgent Care yesterday due to swelling and was given an order for Lasix but the order was not signed by the Urgent Care Provider.   Instructed nurse to call the Urgent Care to have them fax a Signed order since we didn't see the patient.   She agreed.

## 2021-10-15 ENCOUNTER — Encounter: Payer: Self-pay | Admitting: Cardiology

## 2021-10-16 MED ORDER — FUROSEMIDE 20 MG PO TABS: ORAL_TABLET | ORAL | 0 refills | Status: AC

## 2021-10-17 ENCOUNTER — Telehealth: Payer: Self-pay | Admitting: Cardiology

## 2021-10-17 NOTE — Telephone Encounter (Signed)
Calling to ask the our office send off another ordered but they realize they dont need. Asking that we sent over anther ordered saying disregard. Please advise

## 2021-10-17 NOTE — Telephone Encounter (Signed)
Spoke with the nurse at [facility] assisted living. Her concern is that med techs cannot do prn medications. She said they can do daily weights of the patient, but they need to call each time there is a weight gain of 3 pounds over night or 5 pounds within a week before giving the lasix. I discussed with the nurse about weekends and holidays regarding if the patient gains weight, that we are not in the office during those times. We concluded that [facility] will accept the prn lasix order, but can call our office at any time there are questions regarding patient weight gain and the prn lasix. The nurse gave me the last five weeks of the patient's weekly weights (166 pounds to 167.2 pounds). Also, I let the nurse know that the patient's daughter Arby Barrette wants her father weighed each day.

## 2021-10-19 NOTE — Telephone Encounter (Signed)
Can we bring him in to be seen?  He may need to stay on scheduled Lasix.  Can we schedule him for 1/12 at 1:40?

## 2021-10-20 NOTE — Telephone Encounter (Signed)
[facility] called to check orders that were supposed to be sent on Friday, in regards to his daily weights and lasix.

## 2021-10-20 NOTE — Telephone Encounter (Signed)
Spoke with [facility] personnel regarding patient's appointment on 1/12 at 1:40 pm. She state she will call daughter Arby Barrette to let her know.

## 2021-10-20 NOTE — Telephone Encounter (Signed)
Spoke with pt daughter, follow up scheduled per dr Gardiner Rhyme. Aware [facility] said he is refusing to weigh daily. [facility] aware no change in orders until patient is seen.

## 2021-10-23 ENCOUNTER — Other Ambulatory Visit: Payer: Self-pay

## 2021-10-23 ENCOUNTER — Ambulatory Visit (INDEPENDENT_AMBULATORY_CARE_PROVIDER_SITE_OTHER): Payer: Medicare Other | Admitting: Cardiology

## 2021-10-23 ENCOUNTER — Encounter: Payer: Self-pay | Admitting: Cardiology

## 2021-10-23 VITALS — BP 138/52 | HR 63 | Ht 68.0 in | Wt 163.0 lb

## 2021-10-23 DIAGNOSIS — R001 Bradycardia, unspecified: Secondary | ICD-10-CM

## 2021-10-23 DIAGNOSIS — I4891 Unspecified atrial fibrillation: Secondary | ICD-10-CM | POA: Diagnosis not present

## 2021-10-23 DIAGNOSIS — I2510 Atherosclerotic heart disease of native coronary artery without angina pectoris: Secondary | ICD-10-CM | POA: Diagnosis not present

## 2021-10-23 DIAGNOSIS — I5042 Chronic combined systolic (congestive) and diastolic (congestive) heart failure: Secondary | ICD-10-CM

## 2021-10-23 DIAGNOSIS — E785 Hyperlipidemia, unspecified: Secondary | ICD-10-CM

## 2021-10-23 DIAGNOSIS — R600 Localized edema: Secondary | ICD-10-CM | POA: Diagnosis not present

## 2021-10-23 NOTE — Progress Notes (Signed)
Cardiology Office Note:    Date:  10/23/2021   ID:  Ronnie Joseph, DOB [birth year], MRN [id number]  PCP:  [name], NP  Cardiologist:  Donato Heinz, MD  Electrophysiologist:  Vickie Epley, MD   Referring MD: [name], NP   Chief Complaint  Patient presents with   Follow-up   Edema     History of Present Illness:    Ronnie Joseph is a 88 y.o. male with a hx of CAD status post PCI to LAD and LCx in 05/2019, ischemic cardiomyopathy (EF 25% post PCI, improved to 35-40 % on most recent echo), atrial fibrillation on Eliquis who presents for an initial visit.  His previous cardiologist was [name] in [facility].  Echocardiogram 09/26/2020 in [place] showed LVEF 35 to 40%, mildly reduced RV systolic function.  Cardiac catheterization on 06/01/2019 showed tandem diffuse 90% stenosis of mid LAD status post DES, 95% distal LCx stenosis status post DES.  Echocardiogram on 05/26/2019 showed LVEF 25 to 30%.  He was admitted from 12/16 through 10/02/20 with NSTEMI in [place].  He presented to ED with chest pain.  Was also found to have fever to 100.8, lactic acid of 12, hemoglobin 7.5.  Troponin peaked at 8300.  He received 2 units PRBCs.  He was evaluated by cardiology, who recommended medical management.  He has a history of GI bleeds with AVMs.  Underwent EGD/colonoscopy on 09/30/20 which showed duodenal AVMs status post APC and colonoscopy showed ascending colon AVM status post APC.  During admission was tolerating Plavix and apixaban without any further bleeding.  He was started on amiodarone for his atrial fibrillation, with plans for amiodarone 200 mg twice daily x1 month then 200 mg daily.  Echo 07/15/2021 showed EF 40 to 45%, normal RV function, mild MR, mild AI.  Since last clinic visit, he presented to the ED earlier this month with lower extremity edema.  Reports started on January 1.  Left greater than right edema.  Ultrasound in the ED showed no DVT.  Has  been using compression stockings and took Lasix 20 mg x 5 days.  Reports edema significantly improved.  Denies any chest pain, dyspnea, lightheadedness, syncope, or palpitations.  Wt Readings from Last 3 Encounters:  10/23/21 163 lb (73.9 kg)  10/13/21 166 lb (75.3 kg)  09/19/21 166 lb (75.3 kg)    Past Medical History:  Diagnosis Date   Acute anemia 09/26/2020   Anemia due to acute blood loss 05/25/2019   Chronic maxillary sinusitis 08/06/2017   Coronary artery disease due to lipid rich plaque 07/27/2019   Essential hypertension 02/14/2003   Fainting 05/30/2019   Fall 05/25/1999   High cholesterol 03/14/2001   History of basal cell carcinoma 11/02/2019   History of pneumonia    History of snoring    History of upper respiratory infection    Irregular heartbeat 08/06/2003   Ischemic cardiomyopathy 05/30/2019   Mild cognitive impairment 05/15/2019   NSTEMI (non-ST elevated myocardial infarction) ([place]) 06/03/2019   Poor renal function 03/19/2005   Primary osteoarthritis of shoulder 10/24/2015   Psoriasis 12/29/2004   Rash and nonspecific skin eruption 04/30/2020   Schatzki's ring 08/07/2020   Sepsis with acute hypoxic respiratory failure ([place]) 09/26/2020   Sleep apnea 08/13/2003    Past Surgical History:  Procedure Laterality Date   APPENDECTOMY     CATARACT EXTRACTION     COLONOSCOPY     HERNIA REPAIR     RIGHT HEART CATHETERIZATION WITH  ADENOSINE STUDY     UPPER GI ENDOSCOPY      Current Medications: Current Meds  Medication Sig   acetaminophen (TYLENOL) 500 MG tablet Take 500 mg by mouth 2 (two) times daily.   amiodarone (PACERONE) 200 MG tablet Take 1 tablet (200 mg total) by mouth daily.   apixaban (ELIQUIS) 2.5 MG TABS tablet Take 1 tablet (2.5 mg total) by mouth 2 (two) times daily.   aspirin EC 81 MG tablet Take 1 tablet (81 mg total) by mouth daily. Swallow whole.   bisacodyl (DULCOLAX) 5 MG EC tablet Take 5 mg by mouth daily as needed for moderate  constipation.   CALCIUM CITRATE PO Take 1 tablet by mouth 2 (two) times daily.   empagliflozin (JARDIANCE) 10 MG TABS tablet Take 1 tablet (10 mg total) by mouth daily before breakfast.   ferrous sulfate 325 (65 FE) MG tablet Take 325 mg by mouth daily.   fluticasone (FLONASE) 50 MCG/ACT nasal spray Place 2 sprays into both nostrils 2 (two) times daily.   fluticasone furoate-vilanterol (BREO ELLIPTA) 100-25 MCG/INH AEPB Inhale 1 puff into the lungs daily.   furosemide (LASIX) 20 MG tablet Take 20 mg once daily for 5 days and then take as needed for weight gain of 3 lbs in one day or 5 lbs in one week   levocetirizine (XYZAL) 5 MG tablet Take 1 tablet (5 mg total) by mouth daily.   levothyroxine (SYNTHROID) 88 MCG tablet Take 1 tablet (88 mcg total) by mouth daily.   losartan (COZAAR) 50 MG tablet Take 1 tablet (50 mg total) by mouth in the morning.   montelukast (SINGULAIR) 10 MG tablet Take 1 tablet (10 mg total) by mouth daily.   Multiple Vitamin (MULTIVITAMIN PO) Take 1 tablet by mouth daily.   naltrexone (DEPADE) 50 MG tablet 1/2 tablet   pantoprazole (PROTONIX) 40 MG tablet Take 1 tablet (40 mg total) by mouth daily.   polyethylene glycol (MIRALAX / GLYCOLAX) 17 g packet Take 17 g by mouth daily as needed.   rosuvastatin (CRESTOR) 20 MG tablet Take 1 tablet (20 mg total) by mouth daily.   Saw Palmetto, Serenoa repens, (SAW PALMETTO FRUIT PO) Take 450 mg by mouth 2 (two) times daily.   senna-docusate (SENOKOT-S) 8.6-50 MG tablet Take 1 tablet by mouth 2 (two) times daily.   triamcinolone cream (KENALOG) 0.1 % Apply topically daily.     Allergies:   Lisinopril, Sulfamethoxazole-trimethoprim, Telmisartan, Atorvastatin, and Sulfa antibiotics   Social History   Socioeconomic History   Marital status: Widowed    Spouse name: Not on file   Number of children: Not on file   Years of education: Not on file   Highest education level: Not on file  Occupational History   Not on file   Tobacco Use   Smoking status: Former    Packs/day: 0.25    Types: Cigarettes    Quit date: 12/17/1958    Years since quitting: 62.8   Smokeless tobacco: Never  Vaping Use   Vaping Use: Never used  Substance and Sexual Activity   Alcohol use: Yes    Alcohol/week: 3.0 standard drinks    Types: 3 Standard drinks or equivalent per week   Drug use: Never   Sexual activity: Not Currently  Other Topics Concern   Not on file  Social History Narrative   Tobacco use, amount per day now: 0   Past tobacco use, amount per day: Less than 1 pack   How many  years did you use tobacco: 5 years, stopped in 1960   Alcohol use (drinks per week): 4   Diet: N/A   Do you drink/eat things with caffeine: Yes.   Marital status: Widowed                                  What year were you married? 1960   Do you live in a house, apartment, assisted living, condo, trailer, etc.? Assisted Living.   Is it one or more stories? 1    How many persons live in your home? 1   Do you have pets in your home?( please list) No.   Highest Level of education completed? College   Current or past profession: Pharmacist   Do you exercise?  No.                                Type and how often?   Do you have a living will? Yes.   Do you have a DNR form?  Yes.                                 If not, do you want to discuss one?   Do you have signed POA/HPOA forms? Yes.                       If so, please bring to you appointment      Do you have any difficulty bathing or dressing yourself? Yes.   Do you have any difficulty preparing food or eating? Yes.   Do you have any difficulty managing your medications? Yes.   Do you have any difficulty managing your finances? No.   Do you have any difficulty affording your medications? No.   Social Determinants of Health   Financial Resource Strain: Not on file  Food Insecurity: Not on file  Transportation Needs: Not on file  Physical Activity: Not on file  Stress: Not on file   Social Connections: Not on file     Family History: The patient's family history includes Brain cancer in his brother; Breast cancer in his daughter; Celiac disease in his daughter; Colon cancer in an other family member; Colon polyps in an other family member; Diabetes in his son; Diabetes Mellitus I in his son; Esophageal cancer in an other family member; Heart attack in his father; Heart disease in his father; Lung disease in his mother; Pancreatic cancer in an other family member; Stomach cancer in an other family member.  ROS:   Please see the history of present illness.    All other systems reviewed and are negative.  EKGs/Labs/Other Studies Reviewed:    The following studies were reviewed today:   EKG:    09/19/21: Sinus bradycardia, rate 47, first-degree AV block, right bundle branch block, left anterior fascicular block, Q waves in inferior leads and V5/6 9/22-sinus bradycardia, rate 52, first-degree AV block, right bundle branch block, Q waves in inferior leads and lateral leads 6/22- The EKG ordered demonstrates Sinus rhythm, rate 59, non specific  intraventricular  conduction delay, first degree aV block, poor R wave progression, Q waves in inferior leads,  4/22- EKG was not ordered today 2/22- sinus bradycardia, rate 58, first-degree AV block, inferior Q waves, RBBB  Recent Labs:  12/10/2020: Brain Natriuretic Peptide 361 03/13/2021: ALT 11; TSH 1.880 06/24/2021: Magnesium 2.6 10/13/2021: BUN 15; Creatinine, Ser 1.15; Hemoglobin 12.0; Platelets 357; Potassium 4.2; Sodium 140  Recent Lipid Panel    Component Value Date/Time   CHOL 147 12/10/2020 0000   TRIG 176 (H) 12/10/2020 0000   HDL 51 12/10/2020 0000   CHOLHDL 2.9 12/10/2020 0000   LDLCALC 71 12/10/2020 0000    Physical Exam:    VS:  BP (!) 138/52 (BP Location: Right Arm, Patient Position: Sitting, Cuff Size: Normal)    Pulse 63    Ht 5\' 8"  (1.727 m)    Wt 163 lb (73.9 kg)    BMI 24.78 kg/m     Wt Readings from  Last 3 Encounters:  10/23/21 163 lb (73.9 kg)  10/13/21 166 lb (75.3 kg)  09/19/21 166 lb (75.3 kg)     GEN:  in no acute distress HEENT: Normal NECK: No JVD CARDIAC: RRR, 2/6 systolic murmur RESPIRATORY:  Clear to auscultation without rales, wheezing or rhonchi  ABDOMEN: Soft, non-tender, non-distended MUSCULOSKELETAL:  Trace edema in bilateral LE SKIN: Warm and dry NEUROLOGIC:  Alert and oriented x 3 PSYCHIATRIC:  Normal affect   ASSESSMENT:    1. Chronic combined systolic and diastolic heart failure (HCC)   2. Leg edema   3. Atrial fibrillation, unspecified type ([place])   4. CAD in native artery   5. Bradycardia   6. Hyperlipidemia, unspecified hyperlipidemia type       PLAN:    CAD: Cardiac catheterization on 06/01/2019 showed tandem diffuse 90% stenosis of mid LAD status post DES, 95% distal LCx stenosis status post DES.  Admitted in [place] with NSTEMI 09/2020.  In setting of GI bleed, medical management was recommended. -Continue aspirin plus Eliquis.   -Continue rosuvastatin 20 mg daily  Sinus bradycardia: Heart rate previously in 40s, has improved with discontinuing metoprolol. 1s in clinic today.   Chronic combined systolic and diastolic heart failure: EF 35-40% on echo 09/2020.  Echo 07/15/2021 showed EF 40 to 45%, normal RV function, mild MR, mild AI.   -Discontinued Toprol-XL due to bradycardia as above -Continue losartan 50 mg daily -Continue Jardiance 10 mg daily -Continue spironolactone 12.5 mg daily -Appears euvolemic.  Continue Lasix 20 mg daily as needed for weight gain greater than 3 pounds in 1 day or 5 pounds in 1 week -Check CMET  LE edema: Recent ED visit with lower extremity edema.  Duplex negative for DVT.  Improved with Lasix 20 mg x 5 days.  Will check albumin level.  Continue Lasix 20 mg daily as needed for weight gain greater than 3 pounds 1 day or 5 pounds in 1 week as above   Atrial fibrillation: Currently in sinus rhythm.   CHA2DS2-VASc score 5 (CHF, hypertension, age x2, CAD) -Continue amiodarone 200 mg daily -Discontinued Toprol-XL due to bradycardia as above -Continue Eliquis 2.5 mg BID given age over 68 and creatinine greater than 1.5.  Denies any recent bleeding. -While he has an elevated CHA2DS2-VASc score, he is a high risk anticoagulation candidate given his history of GI bleeding and falls.  Referred to Dr. Quentin Ore for [facility] evaluation, recommended monitoring for now, if have further bleeding consider [facility]   Anemia: Had GI bleed, has iron deficiency anemia.    Denies any recent bleeding, reports difficult to tell given black-colored stools on iron supplements.  Reports has been having significant issues with constipation on p.o. iron.  Referred to GI for evaluation.  Has  intermittently bleeding AVMs, GI advised against repeat endoscopic procedures given his age.  Was referred to  hematology, on p.o. iron   Hypertension: On spironolactone, and losartan.  Appears controlled   Hyperlipidemia: On rosuvastatin 20 mg daily.  LDL 71 on 12/10/2020   Hypothyroidism: TSH has been elevated, PCP adjusting levothyroxine dose.  TSH 03/13/21 was normal    RTC in 2 months    Medication Adjustments/Labs and Tests Ordered: Current medicines are reviewed at length with the patient today.  Concerns regarding medicines are outlined above.  Orders Placed This Encounter  Procedures   Comprehensive Metabolic Panel (CMET)     No orders of the defined types were placed in this encounter.     Patient Instructions  Medication Instructions:  Your physician recommends that you continue on your current medications as directed. Please refer to the Current Medication list given to you today.  *If you need a refill on your cardiac medications before your next appointment, please call your pharmacy*   Lab Work: Your physician recommends that you return for lab work in:  TODAY: CMET If you have labs (blood work) drawn  today and your tests are completely normal, you will receive your results only by: [place] (if you have [place]) OR A paper copy in the mail If you have any lab test that is abnormal or we need to change your treatment, we will call you to review the results.   Testing/Procedures: None   Follow-Up: At [facility], you and your health needs are our priority.  As part of our continuing mission to provide you with exceptional heart care, we have created designated Provider Care Teams.  These Care Teams include your primary Cardiologist (physician) and Advanced Practice Providers (APPs -  Physician Assistants and Nurse Practitioners) who all work together to provide you with the care you need, when you need it.  We recommend signing up for the patient portal called "MyChart".  Sign up information is provided on this After Visit Summary.  MyChart is used to connect with patients for Virtual Visits (Telemedicine).  Patients are able to view lab/test results, encounter notes, upcoming appointments, etc.  Non-urgent messages can be sent to your provider as well.   To learn more about what you can do with MyChart, go to [email or web address].    Your next appointment:    March 14th at 9:30am  The format for your next appointment:   In Person  Provider:   Donato Heinz, MD     Other Instructions        Signed, Donato Heinz, MD  10/23/2021 5:44 PM    [place]

## 2021-10-23 NOTE — Patient Instructions (Signed)
Medication Instructions:  Your physician recommends that you continue on your current medications as directed. Please refer to the Current Medication list given to you today.  *If you need a refill on your cardiac medications before your next appointment, please call your pharmacy*   Lab Work: Your physician recommends that you return for lab work in:  TODAY: CMET If you have labs (blood work) drawn today and your tests are completely normal, you will receive your results only by: [place] (if you have [place]) OR A paper copy in the mail If you have any lab test that is abnormal or we need to change your treatment, we will call you to review the results.   Testing/Procedures: None   Follow-Up: At [facility], you and your health needs are our priority.  As part of our continuing mission to provide you with exceptional heart care, we have created designated Provider Care Teams.  These Care Teams include your primary Cardiologist (physician) and Advanced Practice Providers (APPs -  Physician Assistants and Nurse Practitioners) who all work together to provide you with the care you need, when you need it.  We recommend signing up for the patient portal called "MyChart".  Sign up information is provided on this After Visit Summary.  MyChart is used to connect with patients for Virtual Visits (Telemedicine).  Patients are able to view lab/test results, encounter notes, upcoming appointments, etc.  Non-urgent messages can be sent to your provider as well.   To learn more about what you can do with MyChart, go to [email or web address].    Your next appointment:    March 14th at 9:30am  The format for your next appointment:   In Person  Provider:   Donato Heinz, MD     Other Instructions

## 2021-10-24 ENCOUNTER — Telehealth: Payer: Self-pay | Admitting: Cardiology

## 2021-10-24 LAB — COMPREHENSIVE METABOLIC PANEL
ALT: 16 IU/L (ref 0–44)
AST: 18 IU/L (ref 0–40)
Albumin/Globulin Ratio: 1.9 (ref 1.2–2.2)
Albumin: 4.2 g/dL (ref 3.6–4.6)
Alkaline Phosphatase: 46 IU/L (ref 44–121)
BUN/Creatinine Ratio: 13 (ref 10–24)
BUN: 18 mg/dL (ref 8–27)
Bilirubin Total: 0.3 mg/dL (ref 0.0–1.2)
CO2: 25 mmol/L (ref 20–29)
Calcium: 9.4 mg/dL (ref 8.6–10.2)
Chloride: 103 mmol/L (ref 96–106)
Creatinine, Ser: 1.36 mg/dL — ABNORMAL HIGH (ref 0.76–1.27)
Globulin, Total: 2.2 g/dL (ref 1.5–4.5)
Glucose: 78 mg/dL (ref 70–99)
Potassium: 5.2 mmol/L (ref 3.5–5.2)
Sodium: 147 mmol/L — ABNORMAL HIGH (ref 134–144)
Total Protein: 6.4 g/dL (ref 6.0–8.5)
eGFR: 50 mL/min/{1.73_m2} — ABNORMAL LOW (ref >59)

## 2021-10-24 NOTE — Telephone Encounter (Signed)
Yes that is the lasix order

## 2021-10-24 NOTE — Telephone Encounter (Signed)
AISSATOU FROM [facility] IS F/U ON A FAX SEND YESTERDAY EVENING AROUND 3:30PM REGARDING PT'S WEIGHT GAIN. SHE STATES SHE HAVE NOT HEARD BACK FROM ANYONE.

## 2021-10-24 NOTE — Telephone Encounter (Signed)
Spoke with Aissatou from  [place] regarding lasix order. She faxed over what she wants clarified. A letter was faxed to her with the current lasix order from Dr. Christain [place]' notes on 10/23/21  Lasix 20 mg daily as needed for weight gain greater than 3 pounds in 1 day or 5 pounds in 1 week.

## 2021-10-31 ENCOUNTER — Ambulatory Visit: Payer: Medicare Other | Admitting: Hematology and Oncology

## 2021-10-31 ENCOUNTER — Other Ambulatory Visit: Payer: Self-pay

## 2021-10-31 ENCOUNTER — Inpatient Hospital Stay (HOSPITAL_BASED_OUTPATIENT_CLINIC_OR_DEPARTMENT_OTHER): Payer: Medicare Other | Admitting: Hematology and Oncology

## 2021-10-31 ENCOUNTER — Other Ambulatory Visit: Payer: Self-pay | Admitting: *Deleted

## 2021-10-31 ENCOUNTER — Other Ambulatory Visit: Payer: Medicare Other

## 2021-10-31 ENCOUNTER — Encounter: Payer: Self-pay | Admitting: Hematology and Oncology

## 2021-10-31 ENCOUNTER — Inpatient Hospital Stay: Payer: Medicare Other | Attending: Hematology and Oncology

## 2021-10-31 VITALS — BP 150/50 | HR 72 | Temp 97.7°F | Resp 18 | Ht 68.0 in | Wt 168.8 lb

## 2021-10-31 DIAGNOSIS — D500 Iron deficiency anemia secondary to blood loss (chronic): Secondary | ICD-10-CM | POA: Insufficient documentation

## 2021-10-31 DIAGNOSIS — D649 Anemia, unspecified: Secondary | ICD-10-CM

## 2021-10-31 DIAGNOSIS — Q2733 Arteriovenous malformation of digestive system vessel: Secondary | ICD-10-CM | POA: Diagnosis present

## 2021-10-31 LAB — IRON AND IRON BINDING CAPACITY (CC-WL,HP ONLY)
Iron: 59 ug/dL (ref 45–182)
Saturation Ratios: 18 % (ref 17.9–39.5)
TIBC: 336 ug/dL (ref 250–450)
UIBC: 277 ug/dL (ref 117–376)

## 2021-10-31 LAB — CBC WITH DIFFERENTIAL (CANCER CENTER ONLY)
Abs Immature Granulocytes: 0.04 10*3/uL (ref 0.00–0.07)
Basophils Absolute: 0 10*3/uL (ref 0.0–0.1)
Basophils Relative: 0 %
Eosinophils Absolute: 0 10*3/uL (ref 0.0–0.5)
Eosinophils Relative: 0 %
HCT: 35.6 % — ABNORMAL LOW (ref 39.0–52.0)
Hemoglobin: 12 g/dL — ABNORMAL LOW (ref 13.0–17.0)
Immature Granulocytes: 0 %
Lymphocytes Relative: 9 %
Lymphs Abs: 0.8 10*3/uL (ref 0.7–4.0)
MCH: 34.5 pg — ABNORMAL HIGH (ref 26.0–34.0)
MCHC: 33.7 g/dL (ref 30.0–36.0)
MCV: 102.3 fL — ABNORMAL HIGH (ref 80.0–100.0)
Monocytes Absolute: 1.1 10*3/uL — ABNORMAL HIGH (ref 0.1–1.0)
Monocytes Relative: 12 %
Neutro Abs: 7.4 10*3/uL (ref 1.7–7.7)
Neutrophils Relative %: 79 %
Platelet Count: 391 10*3/uL (ref 150–400)
RBC: 3.48 MIL/uL — ABNORMAL LOW (ref 4.22–5.81)
RDW: 13.1 % (ref 11.5–15.5)
WBC Count: 9.3 10*3/uL (ref 4.0–10.5)
nRBC: 0 % (ref 0.0–0.2)

## 2021-10-31 LAB — FERRITIN: Ferritin: 53 ng/mL (ref 24–336)

## 2021-10-31 NOTE — Progress Notes (Signed)
Ronnie Joseph [phone]   Fax:([phone]  PROGRESS NOTE  Patient Care Team: [name], NP as PCP - General (Adult Health Nurse Practitioner) Vickie Epley, MD as PCP - Electrophysiology (Cardiology) Donato Heinz, MD as PCP - Cardiology (Cardiology) Jari Pigg, MD as Consulting Physician (Dermatology) Donato Heinz, MD as Consulting Physician (Cardiology) [name], NP (Adult Health Nurse Practitioner)  CHIEF COMPLAINTS/PURPOSE OF CONSULTATION:  "Iron deficiency anemia "  HISTORY OF PRESENTING ILLNESS:  Ronnie Joseph 88 y.o. male returns for a follow up for iron deficiency anemia. Patient is accompanied by her daughter for this visit.   On exam today, Ronnie Joseph reports that his energy levels are fairly stable. He is able to ambulate with the assistance of a walker.  His appetite is good without any noticeable weight changes.  He denies any nausea, vomiting or abdominal pain.  Patient takes MiraLAX for constipation and has a daily bowel movement.  He denies easy bruising or signs of bleeding.  This includes hematochezia, melena, hemoptysis, epistaxis, hematuria or gingival bleeding.  Patient denies shortness of breath, chest pain, dizziness, syncopal episodes, fevers chills or night sweats.  He has no other complaints.  Rest of the 10 point ROS is below.  MEDICAL HISTORY:  Past Medical History:  Diagnosis Date   Acute anemia 09/26/2020   Anemia due to acute blood loss 05/25/2019   Chronic maxillary sinusitis 08/06/2017   Coronary artery disease due to lipid rich plaque 07/27/2019   Essential hypertension 02/14/2003   Fainting 05/30/2019   Fall 05/25/1999   High cholesterol 03/14/2001   History of basal cell carcinoma 11/02/2019   History of pneumonia    History of snoring    History of upper respiratory infection    Irregular heartbeat 08/06/2003   Ischemic cardiomyopathy 05/30/2019   Mild cognitive impairment  05/15/2019   NSTEMI (non-ST elevated myocardial infarction) ([place]) 06/03/2019   Poor renal function 03/19/2005   Primary osteoarthritis of shoulder 10/24/2015   Psoriasis 12/29/2004   Rash and nonspecific skin eruption 04/30/2020   Schatzki's ring 08/07/2020   Sepsis with acute hypoxic respiratory failure ([place]) 09/26/2020   Sleep apnea 08/13/2003    SURGICAL HISTORY: Past Surgical History:  Procedure Laterality Date   APPENDECTOMY     CATARACT EXTRACTION     COLONOSCOPY     HERNIA REPAIR     RIGHT HEART CATHETERIZATION WITH ADENOSINE STUDY     UPPER GI ENDOSCOPY      SOCIAL HISTORY: Social History   Socioeconomic History   Marital status: Widowed    Spouse name: Not on file   Number of children: Not on file   Years of education: Not on file   Highest education level: Not on file  Occupational History   Not on file  Tobacco Use   Smoking status: Former    Packs/day: 0.25    Types: Cigarettes    Quit date: 12/17/1958    Years since quitting: 62.9   Smokeless tobacco: Never  Vaping Use   Vaping Use: Never used  Substance and Sexual Activity   Alcohol use: Yes    Alcohol/week: 3.0 standard drinks    Types: 3 Standard drinks or equivalent per week   Drug use: Never   Sexual activity: Not Currently  Other Topics Concern   Not on file  Social History Narrative   Tobacco use, amount per day now: 0   Past tobacco use, amount per day: Less than 1 pack  How many years did you use tobacco: 5 years, stopped in 1960   Alcohol use (drinks per week): 4   Diet: N/A   Do you drink/eat things with caffeine: Yes.   Marital status: Widowed                                  What year were you married? 1960   Do you live in a house, apartment, assisted living, condo, trailer, etc.? Assisted Living.   Is it one or more stories? 1    How many persons live in your home? 1   Do you have pets in your home?( please list) No.   Highest Level of education completed? College   Current or  past profession: Pharmacist   Do you exercise?  No.                                Type and how often?   Do you have a living will? Yes.   Do you have a DNR form?  Yes.                                 If not, do you want to discuss one?   Do you have signed POA/HPOA forms? Yes.                       If so, please bring to you appointment      Do you have any difficulty bathing or dressing yourself? Yes.   Do you have any difficulty preparing food or eating? Yes.   Do you have any difficulty managing your medications? Yes.   Do you have any difficulty managing your finances? No.   Do you have any difficulty affording your medications? No.   Social Determinants of Health   Financial Resource Strain: Not on file  Food Insecurity: Not on file  Transportation Needs: Not on file  Physical Activity: Not on file  Stress: Not on file  Social Connections: Not on file  Intimate Partner Violence: Not on file    FAMILY HISTORY: Family History  Problem Relation Age of Onset   Lung disease Mother    Heart disease Father    Heart attack Father    Brain cancer Brother    Breast cancer Daughter    Celiac disease Daughter    Diabetes Son    Diabetes Mellitus I Son    Stomach cancer Other    Pancreatic cancer Other    Esophageal cancer Other    Colon polyps Other    Colon cancer Other     ALLERGIES:  is allergic to lisinopril, sulfamethoxazole-trimethoprim, telmisartan, atorvastatin, and sulfa antibiotics.  MEDICATIONS:  Current Outpatient Medications  Medication Sig Dispense Refill   acetaminophen (TYLENOL) 500 MG tablet Take 500 mg by mouth 2 (two) times daily.     amiodarone (PACERONE) 200 MG tablet Take 1 tablet (200 mg total) by mouth daily. 90 tablet 3   apixaban (ELIQUIS) 2.5 MG TABS tablet Take 1 tablet (2.5 mg total) by mouth 2 (two) times daily. 180 tablet 3   aspirin EC 81 MG tablet Take 1 tablet (81 mg total) by mouth daily. Swallow whole. 30 tablet 11   bisacodyl (DULCOLAX)  5 MG EC tablet Take 5 mg  by mouth daily as needed for moderate constipation.     CALCIUM CITRATE PO Take 1 tablet by mouth 2 (two) times daily.     empagliflozin (JARDIANCE) 10 MG TABS tablet Take 1 tablet (10 mg total) by mouth daily before breakfast. 90 tablet 3   ferrous sulfate 325 (65 FE) MG tablet Take 325 mg by mouth daily.     fluticasone (FLONASE) 50 MCG/ACT nasal spray Place 2 sprays into both nostrils 2 (two) times daily.     fluticasone furoate-vilanterol (BREO ELLIPTA) 100-25 MCG/INH AEPB Inhale 1 puff into the lungs daily. 180 each 1   furosemide (LASIX) 20 MG tablet Take 20 mg once daily for 5 days and then take as needed for weight gain of 3 lbs in one day or 5 lbs in one week 5 tablet 0   levocetirizine (XYZAL) 5 MG tablet Take 1 tablet (5 mg total) by mouth daily. 90 tablet 1   levothyroxine (SYNTHROID) 88 MCG tablet Take 1 tablet (88 mcg total) by mouth daily. 90 tablet 1   losartan (COZAAR) 50 MG tablet Take 1 tablet (50 mg total) by mouth in the morning. 90 tablet 3   montelukast (SINGULAIR) 10 MG tablet Take 1 tablet (10 mg total) by mouth daily. 90 tablet 1   Multiple Vitamin (MULTIVITAMIN PO) Take 1 tablet by mouth daily.     naltrexone (DEPADE) 50 MG tablet 1/2 tablet     pantoprazole (PROTONIX) 40 MG tablet Take 1 tablet (40 mg total) by mouth daily. 90 tablet 1   polyethylene glycol (MIRALAX / GLYCOLAX) 17 g packet Take 17 g by mouth daily as needed.     rosuvastatin (CRESTOR) 20 MG tablet Take 1 tablet (20 mg total) by mouth daily. 30 tablet 0   Saw Palmetto, Serenoa repens, (SAW PALMETTO FRUIT PO) Take 450 mg by mouth 2 (two) times daily.     senna-docusate (SENOKOT-S) 8.6-50 MG tablet Take 1 tablet by mouth 2 (two) times daily.     triamcinolone cream (KENALOG) 0.1 % Apply topically daily.     No current facility-administered medications for this visit.    REVIEW OF SYSTEMS:   Constitutional: ( - ) fevers, ( - )  chills , ( - ) night sweats Eyes: ( - )  blurriness of vision, ( - ) double vision, ( - ) watery eyes Ears, nose, mouth, throat, and face: ( - ) mucositis, ( - ) sore throat Respiratory: ( - ) cough, ( - ) dyspnea, ( - ) wheezes Cardiovascular: ( - ) palpitation, ( - ) chest discomfort, ( - ) lower extremity swelling Gastrointestinal:  ( - ) nausea, ( - ) heartburn, ( - ) change in bowel habits Skin: ( - ) abnormal skin rashes Lymphatics: ( - ) new lymphadenopathy, ( - ) easy bruising Neurological: ( - ) numbness, ( - ) tingling, ( - ) new weaknesses Behavioral/Psych: ( - ) mood change, ( - ) new changes  All other systems were reviewed with the patient and are negative.  PHYSICAL EXAMINATION: ECOG PERFORMANCE STATUS: 1 - Symptomatic but completely ambulatory  Vitals:   10/31/21 1113  BP: (!) 150/50  Pulse: 72  Resp: 18  Temp: 97.7 F (36.5 C)  SpO2: 97%   Filed Weights   10/31/21 1113  Weight: 168 lb 12.8 oz (76.6 kg)    GENERAL: well appearing male in NAD  SKIN: skin color, texture, turgor are normal, no rashes or significant lesions. Scattered bruising noted on  forearms.  EYES: conjunctiva are pink and non-injected, sclera clear OROPHARYNX: no exudate, no erythema; lips, buccal mucosa, and tongue normal  LUNGS: clear to auscultation and percussion with normal breathing effort HEART: regular rate & rhythm and no murmurs and no lower extremity edema Musculoskeletal: no cyanosis of digits and no clubbing  PSYCH: alert & oriented x 3, fluent speech NEURO: no focal motor/sensory deficits  LABORATORY DATA:  I have reviewed the data as listed CBC Latest Ref Rng & Units 10/31/2021 10/13/2021 09/19/2021  WBC 4.0 - 10.5 K/uL 9.3 8.4 7.9  Hemoglobin 13.0 - 17.0 g/dL 12.0(L) 12.0(L) 12.8(L)  Hematocrit 39.0 - 52.0 % 35.6(L) 36.9(L) 38.5  Platelets 150 - 400 K/uL 391 357 307    CMP Latest Ref Rng & Units 10/23/2021 10/13/2021 09/24/2021  Glucose 70 - 99 mg/dL 78 98 95  BUN 8 - 27 mg/dL 18 15 17   Creatinine 0.76 - 1.27 mg/dL  1.36(H) 1.15 1.66(H)  Sodium 134 - 144 mmol/L 147(H) 140 143  Potassium 3.5 - 5.2 mmol/L 5.2 4.2 5.1  Chloride 96 - 106 mmol/L 103 105 100  CO2 20 - 29 mmol/L 25 27 24   Calcium 8.6 - 10.2 mg/dL 9.4 9.3 9.3  Total Protein 6.0 - 8.5 g/dL 6.4 - -  Total Bilirubin 0.0 - 1.2 mg/dL 0.3 - -  Alkaline Phos 44 - 121 IU/L 46 - -  AST 0 - 40 IU/L 18 - -  ALT 0 - 44 IU/L 16 - -    ASSESSMENT & PLAN Joshia Kitchings is a 88 y.o. male who returns for follow-up for iron deficiency anemia.  #Iron deficiency anemia 2/2 chronic blood loss: -- Secondary to intermittent bleeding AVMs.  GI advised against repeat endoscopic procedures given his age. Recommend to proceed with supportive care.  -- Currently on ferrous sulfate 325 mg once daily.  Patient supplements with MiraLAX to minimize constipation. --Labs today show overall stable hemoglobin of 11.9, MCV 105.5.  Iron panel shows that serum iron is 109, iron saturation 31% and ferritin levels are stable at 27. --Recommend to continue with oral supplementation and return to the clinic in 3 months with repeat labs.   No orders of the defined types were placed in this encounter.   All questions were answered. The patient knows to call the clinic with any problems, questions or concerns.  I have spent a total of 25 minutes minutes of face-to-face and non-face-to-face time, preparing to see the patient, performing a medically appropriate examination, counseling and educating the patient, ordering tests, documenting clinical information in the electronic health record, and care coordination.   Dede Query, PA-C Department of Hematology/Oncology [place] at [facility] Phone: [phone]

## 2021-10-31 NOTE — Progress Notes (Signed)
[place] [phone]   Fax:(336) [phone]  PROGRESS NOTE  Patient Care Team: [name], NP as PCP - General (Adult Health Nurse Practitioner) Vickie Epley, MD as PCP - Electrophysiology (Cardiology) Donato Heinz, MD as PCP - Cardiology (Cardiology) Jari Pigg, MD as Consulting Physician (Dermatology) Donato Heinz, MD as Consulting Physician (Cardiology) [name], NP (Adult Health Nurse Practitioner)  CHIEF COMPLAINTS/PURPOSE OF CONSULTATION:  "Iron deficiency anemia "  HISTORY OF PRESENTING ILLNESS:   Ronnie Joseph 88 y.o. male returns for a follow up for iron deficiency anemia. Patient is accompanied by her daughter for this visit.   On exam today, Ronnie Joseph reports that his energy levels are fairly stable.  He has been taking his oral ferrous sulfate once a day.  He has not noticed any blood in his stool.  He has lost little bit of weight since last visit since he does not tend to eat much at the senior living.  He says that the lunch and dinner are too close.  He eats breakfast well.  His energy is about the same, he is not very active, tries to walk to the dining room back-and-forth if he wants to exercise.  He recently had some issues with lower extremity swelling and had to take diuretics for few days. Rest of the pertinent 10 point ROS reviewed and negative.   MEDICAL HISTORY:  Past Medical History:  Diagnosis Date   Acute anemia 09/26/2020   Anemia due to acute blood loss 05/25/2019   Chronic maxillary sinusitis 08/06/2017   Coronary artery disease due to lipid rich plaque 07/27/2019   Essential hypertension 02/14/2003   Fainting 05/30/2019   Fall 05/25/1999   High cholesterol 03/14/2001   History of basal cell carcinoma 11/02/2019   History of pneumonia    History of snoring    History of upper respiratory infection    Irregular heartbeat 08/06/2003   Ischemic cardiomyopathy 05/30/2019   Mild  cognitive impairment 05/15/2019   NSTEMI (non-ST elevated myocardial infarction) ([place]) 06/03/2019   Poor renal function 03/19/2005   Primary osteoarthritis of shoulder 10/24/2015   Psoriasis 12/29/2004   Rash and nonspecific skin eruption 04/30/2020   Schatzki's ring 08/07/2020   Sepsis with acute hypoxic respiratory failure ([place]) 09/26/2020   Sleep apnea 08/13/2003    SURGICAL HISTORY: Past Surgical History:  Procedure Laterality Date   APPENDECTOMY     CATARACT EXTRACTION     COLONOSCOPY     HERNIA REPAIR     RIGHT HEART CATHETERIZATION WITH ADENOSINE STUDY     UPPER GI ENDOSCOPY      SOCIAL HISTORY: Social History   Socioeconomic History   Marital status: Widowed    Spouse name: Not on file   Number of children: Not on file   Years of education: Not on file   Highest education level: Not on file  Occupational History   Not on file  Tobacco Use   Smoking status: Former    Packs/day: 0.25    Types: Cigarettes    Quit date: 12/17/1958    Years since quitting: 62.9   Smokeless tobacco: Never  Vaping Use   Vaping Use: Never used  Substance and Sexual Activity   Alcohol use: Yes    Alcohol/week: 3.0 standard drinks    Types: 3 Standard drinks or equivalent per week   Drug use: Never   Sexual activity: Not Currently  Other Topics Concern   Not on file  Social  History Narrative   Tobacco use, amount per day now: 0   Past tobacco use, amount per day: Less than 1 pack   How many years did you use tobacco: 5 years, stopped in 1960   Alcohol use (drinks per week): 4   Diet: N/A   Do you drink/eat things with caffeine: Yes.   Marital status: Widowed                                  What year were you married? 1960   Do you live in a house, apartment, assisted living, condo, trailer, etc.? Assisted Living.   Is it one or more stories? 1    How many persons live in your home? 1   Do you have pets in your home?( please list) No.   Highest Level of education completed?  College   Current or past profession: Pharmacist   Do you exercise?  No.                                Type and how often?   Do you have a living will? Yes.   Do you have a DNR form?  Yes.                                 If not, do you want to discuss one?   Do you have signed POA/HPOA forms? Yes.                       If so, please bring to you appointment      Do you have any difficulty bathing or dressing yourself? Yes.   Do you have any difficulty preparing food or eating? Yes.   Do you have any difficulty managing your medications? Yes.   Do you have any difficulty managing your finances? No.   Do you have any difficulty affording your medications? No.   Social Determinants of Health   Financial Resource Strain: Not on file  Food Insecurity: Not on file  Transportation Needs: Not on file  Physical Activity: Not on file  Stress: Not on file  Social Connections: Not on file  Intimate Partner Violence: Not on file    FAMILY HISTORY: Family History  Problem Relation Age of Onset   Lung disease Mother    Heart disease Father    Heart attack Father    Brain cancer Brother    Breast cancer Daughter    Celiac disease Daughter    Diabetes Son    Diabetes Mellitus I Son    Stomach cancer Other    Pancreatic cancer Other    Esophageal cancer Other    Colon polyps Other    Colon cancer Other     ALLERGIES:  is allergic to lisinopril, sulfamethoxazole-trimethoprim, telmisartan, atorvastatin, and sulfa antibiotics.  MEDICATIONS:  Current Outpatient Medications  Medication Sig Dispense Refill   acetaminophen (TYLENOL) 500 MG tablet Take 500 mg by mouth 2 (two) times daily.     amiodarone (PACERONE) 200 MG tablet Take 1 tablet (200 mg total) by mouth daily. 90 tablet 3   apixaban (ELIQUIS) 2.5 MG TABS tablet Take 1 tablet (2.5 mg total) by mouth 2 (two) times daily. 180 tablet 3   aspirin EC 81 MG tablet Take  1 tablet (81 mg total) by mouth daily. Swallow whole. 30 tablet 11    bisacodyl (DULCOLAX) 5 MG EC tablet Take 5 mg by mouth daily as needed for moderate constipation.     CALCIUM CITRATE PO Take 1 tablet by mouth 2 (two) times daily.     empagliflozin (JARDIANCE) 10 MG TABS tablet Take 1 tablet (10 mg total) by mouth daily before breakfast. 90 tablet 3   ferrous sulfate 325 (65 FE) MG tablet Take 325 mg by mouth daily.     fluticasone (FLONASE) 50 MCG/ACT nasal spray Place 2 sprays into both nostrils 2 (two) times daily.     fluticasone furoate-vilanterol (BREO ELLIPTA) 100-25 MCG/INH AEPB Inhale 1 puff into the lungs daily. 180 each 1   furosemide (LASIX) 20 MG tablet Take 20 mg once daily for 5 days and then take as needed for weight gain of 3 lbs in one day or 5 lbs in one week 5 tablet 0   levocetirizine (XYZAL) 5 MG tablet Take 1 tablet (5 mg total) by mouth daily. 90 tablet 1   levothyroxine (SYNTHROID) 88 MCG tablet Take 1 tablet (88 mcg total) by mouth daily. 90 tablet 1   losartan (COZAAR) 50 MG tablet Take 1 tablet (50 mg total) by mouth in the morning. 90 tablet 3   montelukast (SINGULAIR) 10 MG tablet Take 1 tablet (10 mg total) by mouth daily. 90 tablet 1   Multiple Vitamin (MULTIVITAMIN PO) Take 1 tablet by mouth daily.     naltrexone (DEPADE) 50 MG tablet 1/2 tablet     pantoprazole (PROTONIX) 40 MG tablet Take 1 tablet (40 mg total) by mouth daily. 90 tablet 1   polyethylene glycol (MIRALAX / GLYCOLAX) 17 g packet Take 17 g by mouth daily as needed.     rosuvastatin (CRESTOR) 20 MG tablet Take 1 tablet (20 mg total) by mouth daily. 30 tablet 0   Saw Palmetto, Serenoa repens, (SAW PALMETTO FRUIT PO) Take 450 mg by mouth 2 (two) times daily.     senna-docusate (SENOKOT-S) 8.6-50 MG tablet Take 1 tablet by mouth 2 (two) times daily.     triamcinolone cream (KENALOG) 0.1 % Apply topically daily.     No current facility-administered medications for this visit.    REVIEW OF SYSTEMS:   Constitutional: ( - ) fevers, ( - )  chills , ( - ) night  sweats Eyes: ( - ) blurriness of vision, ( - ) double vision, ( - ) watery eyes Ears, nose, mouth, throat, and face: ( - ) mucositis, ( - ) sore throat Respiratory: ( - ) cough, ( - ) dyspnea, ( - ) wheezes Cardiovascular: ( - ) palpitation, ( - ) chest discomfort, ( - ) lower extremity swelling Gastrointestinal:  ( - ) nausea, ( - ) heartburn, ( - ) change in bowel habits Skin: ( - ) abnormal skin rashes Lymphatics: ( - ) new lymphadenopathy, ( - ) easy bruising Neurological: ( - ) numbness, ( - ) tingling, ( - ) new weaknesses Behavioral/Psych: ( - ) mood change, ( - ) new changes  All other systems were reviewed with the patient and are negative.  PHYSICAL EXAMINATION: ECOG PERFORMANCE STATUS: 1 - Symptomatic but completely ambulatory  Vitals:   10/31/21 1113  BP: (!) 150/50  Pulse: 72  Resp: 18  Temp: 97.7 F (36.5 C)  SpO2: 97%   Filed Weights   10/31/21 1113  Weight: 168 lb 12.8 oz (76.6 kg)  GENERAL: well appearing male in NAD, he was in a wheelchair. SKIN: skin color, texture, turgor are normal, no rashes or significant lesions. Scattered bruising noted on forearms.  EYES: conjunctiva are pink and non-injected, sclera clear OROPHARYNX: no exudate, no erythema; lips, buccal mucosa, and tongue normal  LUNGS: clear to auscultation and percussion with normal breathing effort HEART: regular rate & rhythm and no murmurs and no lower extremity edema Musculoskeletal: no cyanosis of digits and no clubbing  PSYCH: alert & oriented x 3, fluent speech NEURO: no focal motor/sensory deficits  LABORATORY DATA:  I have reviewed the data as listed CBC Latest Ref Rng & Units 10/31/2021 10/13/2021 09/19/2021  WBC 4.0 - 10.5 K/uL 9.3 8.4 7.9  Hemoglobin 13.0 - 17.0 g/dL 12.0(L) 12.0(L) 12.8(L)  Hematocrit 39.0 - 52.0 % 35.6(L) 36.9(L) 38.5  Platelets 150 - 400 K/uL 391 357 307    CMP Latest Ref Rng & Units 10/23/2021 10/13/2021 09/24/2021  Glucose 70 - 99 mg/dL 78 98 95  BUN 8 - 27  mg/dL 18 15 17   Creatinine 0.76 - 1.27 mg/dL 1.36(H) 1.15 1.66(H)  Sodium 134 - 144 mmol/L 147(H) 140 143  Potassium 3.5 - 5.2 mmol/L 5.2 4.2 5.1  Chloride 96 - 106 mmol/L 103 105 100  CO2 20 - 29 mmol/L 25 27 24   Calcium 8.6 - 10.2 mg/dL 9.4 9.3 9.3  Total Protein 6.0 - 8.5 g/dL 6.4 - -  Total Bilirubin 0.0 - 1.2 mg/dL 0.3 - -  Alkaline Phos 44 - 121 IU/L 46 - -  AST 0 - 40 IU/L 18 - -  ALT 0 - 44 IU/L 16 - -    ASSESSMENT & PLAN Ronnie Joseph is a 88 y.o. male who returns for follow-up for iron deficiency anemia.  #Iron deficiency anemia 2/2 chronic blood loss: -- Secondary to intermittent bleeding AVMs.  GI advised against repeat endoscopic procedures given his age. Recommend to proceed with supportive care.  -- Currently on ferrous sulfate 325 mg once daily.  Patient supplements with MiraLAX to minimize constipation. --Labs today show overall stable hemoglobin at 12 g, MCV slightly improved.  Iron panel and ferritin pending.  At this time given stable hemoglobin, we have recommended that he continue current dose of iron and return to clinic in 3 to 4 months unless he has any acute bleeding. He and daughter expressed understanding of the recommendations  No orders of the defined types were placed in this encounter.   All questions were answered. The patient knows to call the clinic with any problems, questions or concerns.  I have spent a total of 15 minutes minutes of face-to-face and non-face-to-face time, preparing to see the patient, performing a medically appropriate examination, counseling and educating the patient, ordering tests, documenting clinical information in the electronic health record, and care coordination.   Benay Pike MD

## 2021-11-01 ENCOUNTER — Other Ambulatory Visit: Payer: Self-pay | Admitting: Orthopedic Surgery

## 2021-11-24 ENCOUNTER — Encounter: Payer: Self-pay | Admitting: Cardiology

## 2021-11-25 NOTE — Telephone Encounter (Signed)
Yes if weight has been stable we can move to checking just once per week

## 2021-11-26 ENCOUNTER — Encounter: Payer: Self-pay | Admitting: *Deleted

## 2021-12-03 ENCOUNTER — Telehealth: Payer: Self-pay | Admitting: Cardiology

## 2021-12-03 NOTE — Telephone Encounter (Signed)
Pt c/o medication issue:  1. Name of Medication:   furosemide (LASIX) 20 MG tablet   2. How are you currently taking this medication (dosage and times per day)? Not taking   3. Are you having a reaction (difficulty breathing--STAT)? No   4. What is your medication issue? [facility] is calling requesting this medication be discontinued due to the patient no longer receiving daily weights in order for medication to be needed for weight gain.

## 2021-12-03 NOTE — Telephone Encounter (Signed)
Received a message from Russell from [facility]. I called back and spoke to [place]. They are requesting lasix be discontinued because the patient is not being weighed daily for fluid weight gain.  Do you still want lasix for weekly weight gain or discontinue it altogether? Please advise and I will fax the order.

## 2021-12-04 ENCOUNTER — Encounter: Payer: Self-pay | Admitting: *Deleted

## 2021-12-04 NOTE — Telephone Encounter (Signed)
Letter generated and faxed to [facility] at [phone].

## 2021-12-04 NOTE — Telephone Encounter (Signed)
Would continue as needed Lasix if weight gain above 5 pounds on weekly checks

## 2021-12-10 ENCOUNTER — Encounter: Payer: Self-pay | Admitting: Orthopedic Surgery

## 2021-12-11 ENCOUNTER — Other Ambulatory Visit: Payer: Self-pay

## 2021-12-11 ENCOUNTER — Encounter: Payer: Self-pay | Admitting: Nurse Practitioner

## 2021-12-11 ENCOUNTER — Ambulatory Visit (INDEPENDENT_AMBULATORY_CARE_PROVIDER_SITE_OTHER): Payer: Medicare Other | Admitting: Nurse Practitioner

## 2021-12-11 VITALS — BP 132/70 | HR 56 | Temp 97.1°F | Ht 68.0 in | Wt 172.8 lb

## 2021-12-11 DIAGNOSIS — I2510 Atherosclerotic heart disease of native coronary artery without angina pectoris: Secondary | ICD-10-CM

## 2021-12-11 DIAGNOSIS — I739 Peripheral vascular disease, unspecified: Secondary | ICD-10-CM | POA: Diagnosis not present

## 2021-12-11 DIAGNOSIS — D692 Other nonthrombocytopenic purpura: Secondary | ICD-10-CM | POA: Diagnosis not present

## 2021-12-11 DIAGNOSIS — I5042 Chronic combined systolic (congestive) and diastolic (congestive) heart failure: Secondary | ICD-10-CM

## 2021-12-11 DIAGNOSIS — I700 Atherosclerosis of aorta: Secondary | ICD-10-CM

## 2021-12-11 DIAGNOSIS — R053 Chronic cough: Secondary | ICD-10-CM | POA: Diagnosis not present

## 2021-12-11 DIAGNOSIS — D6869 Other thrombophilia: Secondary | ICD-10-CM

## 2021-12-11 DIAGNOSIS — I10 Essential (primary) hypertension: Secondary | ICD-10-CM

## 2021-12-11 DIAGNOSIS — I480 Paroxysmal atrial fibrillation: Secondary | ICD-10-CM

## 2021-12-11 DIAGNOSIS — N1831 Chronic kidney disease, stage 3a: Secondary | ICD-10-CM

## 2021-12-11 NOTE — Progress Notes (Signed)
Careteam: Patient Care Team: [name], NP as PCP - General (Adult Health Nurse Practitioner) Vickie Epley, MD as PCP - Electrophysiology (Cardiology) Donato Heinz, MD as PCP - Cardiology (Cardiology) Jari Pigg, MD as Consulting Physician (Dermatology) Donato Heinz, MD as Consulting Physician (Cardiology) [name], NP (Adult Health Nurse Practitioner)  PLACE OF SERVICE:  [place] Directive information Does Patient Have a Medical Advance Directive?: Yes, Type of Advance Directive: [place], Does patient want to make changes to medical advance directive?: No - Patient declined  Allergies  Allergen Reactions   Lisinopril Swelling   Sulfamethoxazole-Trimethoprim Other (See Comments)   Telmisartan Hives   Atorvastatin Rash   Sulfa Antibiotics Rash    Chief Complaint  Patient presents with   Medical Management of Chronic Issues    6 month follow up. Discuss the need for Shingrix vaccine, or post pone if patient refuses. Patient has cough. Coughing up phlegm every morning and would like something for it. Patient would like to discuss prostate concerns (testing) Patient would like to discuss something for arthritis.pain other than Tylenol arthritis that doesn't seem to be helping.     HPI: Patient is a 88 y.o. male for routine follow up.  He is followed frequently due to anemia by hematologist.  Going to Dr Delman Cheadle dermatology frequently due to ongoing itching on naltrexone and triamcinolone cream. Daughter reports he is complaining about itching less.  He reports he drinks plenty of water but daughter states "probably not" Does not like to bath often.  He keeps the heat on 81. Has an apartment in [place].   Has chronic cough that is gradually getting worse. He has been having a cough for at least 6 months and he has been on breo for longer, he has never seen pulmonary   CHF- on lasix not need often, had to increase  ~1 month ago, not on lasix now He is currently taking losartan, jardiance   A fib- on eliquis 2.5 mg twice daily, no abnormal bruising or bleeding. Taking amiodarone for rate control   Anemia- continues on iron supplement  Constipation- on miralax which controls bowels.   GERD- stable on protonix.   He has pain in bilateral shoulders and left knee- has done voltaren gel but does not wish to do that again. Has done PT/OT.    Review of Systems:  Review of Systems  Constitutional:  Negative for chills, fever and weight loss.  HENT:  Negative for tinnitus.   Respiratory:  Positive for cough. Negative for sputum production and shortness of breath.   Cardiovascular:  Negative for chest pain, palpitations and leg swelling.  Gastrointestinal:  Negative for abdominal pain, constipation, diarrhea and heartburn.  Genitourinary:  Negative for dysuria, frequency and urgency.  Musculoskeletal:  Positive for falls and joint pain. Negative for back pain and myalgias.  Skin: Negative.   Neurological:  Positive for weakness. Negative for dizziness and headaches.  Psychiatric/Behavioral:  Negative for depression and memory loss. The patient does not have insomnia.    Past Medical History:  Diagnosis Date   Acute anemia 09/26/2020   Anemia due to acute blood loss 05/25/2019   Chronic maxillary sinusitis 08/06/2017   Coronary artery disease due to lipid rich plaque 07/27/2019   Essential hypertension 02/14/2003   Fainting 05/30/2019   Fall 05/25/1999   High cholesterol 03/14/2001   History of basal cell carcinoma 11/02/2019   History of pneumonia  History of snoring    History of upper respiratory infection    Irregular heartbeat 08/06/2003   Ischemic cardiomyopathy 05/30/2019   Mild cognitive impairment 05/15/2019   NSTEMI (non-ST elevated myocardial infarction) ([place]) 06/03/2019   Poor renal function 03/19/2005   Primary osteoarthritis of shoulder 10/24/2015   Psoriasis 12/29/2004    Rash and nonspecific skin eruption 04/30/2020   Schatzki's ring 08/07/2020   Sepsis with acute hypoxic respiratory failure ([place]) 09/26/2020   Sleep apnea 08/13/2003   Past Surgical History:  Procedure Laterality Date   APPENDECTOMY     CATARACT EXTRACTION     COLONOSCOPY     HERNIA REPAIR     RIGHT HEART CATHETERIZATION WITH ADENOSINE STUDY     UPPER GI ENDOSCOPY     Social History:   reports that he quit smoking about 63 years ago. His smoking use included cigarettes. He smoked an average of .25 packs per day. He has never used smokeless tobacco. He reports current alcohol use of about 3.0 standard drinks per week. He reports that he does not use drugs.  Family History  Problem Relation Age of Onset   Lung disease Mother    Heart disease Father    Heart attack Father    Brain cancer Brother    Breast cancer Daughter    Celiac disease Daughter    Diabetes Son    Diabetes Mellitus I Son    Stomach cancer Other    Pancreatic cancer Other    Esophageal cancer Other    Colon polyps Other    Colon cancer Other     Medications: Patient's Medications  New Prescriptions   No medications on file  Previous Medications   ACETAMINOPHEN (TYLENOL) 500 MG TABLET    Take 500 mg by mouth 2 (two) times daily.   AMIODARONE (PACERONE) 200 MG TABLET    Take 1 tablet (200 mg total) by mouth daily.   APIXABAN (ELIQUIS) 2.5 MG TABS TABLET    Take 1 tablet (2.5 mg total) by mouth 2 (two) times daily.   ASPIRIN EC 81 MG TABLET    Take 1 tablet (81 mg total) by mouth daily. Swallow whole.   BISACODYL (DULCOLAX) 5 MG EC TABLET    Take 5 mg by mouth daily as needed for moderate constipation.   CALCIUM CITRATE PO    Take 1 tablet by mouth 2 (two) times daily.   EMPAGLIFLOZIN (JARDIANCE) 10 MG TABS TABLET    Take 1 tablet (10 mg total) by mouth daily before breakfast.   FERROUS SULFATE 325 (65 FE) MG TABLET    Take 325 mg by mouth daily.   FLUTICASONE (FLONASE) 50 MCG/ACT NASAL SPRAY    Place 2  sprays into both nostrils 2 (two) times daily.   FLUTICASONE FUROATE-VILANTEROL (BREO ELLIPTA) 100-25 MCG/ACT AEPB    USE 1 INHALATION ORALLY    DAILY   FUROSEMIDE (LASIX) 20 MG TABLET    Take 20 mg once daily for 5 days and then take as needed for weight gain of 3 lbs in one day or 5 lbs in one week   LEVOCETIRIZINE (XYZAL) 5 MG TABLET    TAKE 1 TABLET DAILY   LEVOTHYROXINE (SYNTHROID) 88 MCG TABLET    Take 1 tablet (88 mcg total) by mouth daily.   LOSARTAN (COZAAR) 50 MG TABLET    Take 1 tablet (50 mg total) by mouth in the morning.   MONTELUKAST (SINGULAIR) 10 MG TABLET    TAKE 1 TABLET DAILY  MULTIPLE VITAMIN (MULTIVITAMIN PO)    Take 1 tablet by mouth daily.   NALTREXONE (DEPADE) 50 MG TABLET    50 mg daily.   PANTOPRAZOLE (PROTONIX) 40 MG TABLET    TAKE 1 TABLET DAILY   POLYETHYLENE GLYCOL (MIRALAX / GLYCOLAX) 17 G PACKET    Take 17 g by mouth daily as needed.   ROSUVASTATIN (CRESTOR) 20 MG TABLET    Take 1 tablet (20 mg total) by mouth daily.   SAW PALMETTO, SERENOA REPENS, (SAW PALMETTO FRUIT PO)    Take 450 mg by mouth 2 (two) times daily.   SENNA-DOCUSATE (SENOKOT-S) 8.6-50 MG TABLET    Take 1 tablet by mouth 2 (two) times daily.   TRIAMCINOLONE CREAM (KENALOG) 0.1 %    Apply topically daily.  Modified Medications   No medications on file  Discontinued Medications   No medications on file    Physical Exam:  Vitals:   12/11/21 1556  BP: 132/70  Pulse: (!) 56  Temp: (!) 97.1 F (36.2 C)  TempSrc: Temporal  SpO2: 97%  Weight: 172 lb 12.8 oz (78.4 kg)  Height: 5\' 8"  (1.727 m)   Body mass index is 26.27 kg/m. Wt Readings from Last 3 Encounters:  12/11/21 172 lb 12.8 oz (78.4 kg)  10/31/21 168 lb 12.8 oz (76.6 kg)  10/23/21 163 lb (73.9 kg)    Physical Exam Constitutional:      General: He is not in acute distress.    Appearance: He is well-developed. He is not diaphoretic.  HENT:     Head: Normocephalic and atraumatic.     Right Ear: External ear normal.      Left Ear: External ear normal.     Mouth/Throat:     Pharynx: No oropharyngeal exudate.  Eyes:     Conjunctiva/sclera: Conjunctivae normal.     Pupils: Pupils are equal, round, and reactive to light.  Cardiovascular:     Rate and Rhythm: Normal rate and regular rhythm.     Heart sounds: Murmur heard.  Pulmonary:     Effort: Pulmonary effort is normal.     Breath sounds: Normal breath sounds.  Abdominal:     General: Bowel sounds are normal.     Palpations: Abdomen is soft.  Musculoskeletal:        General: No tenderness.     Cervical back: Normal range of motion and neck supple.     Right lower leg: No edema.     Left lower leg: No edema.  Skin:    General: Skin is warm and dry.     Findings: No lesion or rash.  Neurological:     Mental Status: He is alert. Mental status is at baseline.    Labs reviewed: Basic Metabolic Panel: Recent Labs    01/10/21 0946 01/17/21 1444 03/13/21 1118 03/20/21 0559 06/24/21 1113 09/19/21 1158 09/24/21 1527 10/13/21 1731 10/23/21 1427  NA 142   < > 143   < > 141   < > 143 140 147*  K 5.4*   < > 4.8   < > 5.1   < > 5.1 4.2 5.2  CL 102   < > 102   < > 103   < > 100 105 103  CO2 23   < > 24   < > 24   < > 24 27 25   GLUCOSE 124*   < > 124*   < > 89   < > 95 98 78  BUN 22   < >  18   < > 21   < > 17 15 18   CREATININE 1.62*   < > 1.58*   < > 1.74*   < > 1.66* 1.15 1.36*  CALCIUM 10.0   < > 9.7   < > 9.5   < > 9.3 9.3 9.4  MG 2.3  --   --   --  2.6*  --   --   --   --   TSH 4.000  --  1.880  --   --   --   --   --   --    < > = values in this interval not displayed.   Liver Function Tests: Recent Labs    03/13/21 1118 10/23/21 1427  AST 16 18  ALT 11 16  ALKPHOS 47 46  BILITOT 0.3 0.3  PROT 6.8 6.4  ALBUMIN 4.3 4.2   No results for input(s): LIPASE, AMYLASE in the last 8760 hours. No results for input(s): AMMONIA in the last 8760 hours. CBC: Recent Labs    06/05/21 1105 06/24/21 1113 08/01/21 1329 09/19/21 1158  10/13/21 1731 10/31/21 1102  WBC 8.7   < > 9.5 7.9 8.4 9.3  NEUTROABS 5.8  --  6.2  --   --  7.4  HGB 11.9*   < > 11.9* 12.8* 12.0* 12.0*  HCT 36.3*   < > 36.3* 38.5 36.9* 35.6*  MCV 104.3*   < > 105.5* 104* 105.1* 102.3*  PLT 297   < > 311 307 357 391   < > = values in this interval not displayed.   Lipid Panel: No results for input(s): CHOL, HDL, LDLCALC, TRIG, CHOLHDL, LDLDIRECT in the last 8760 hours. TSH: Recent Labs    01/10/21 0946 03/13/21 1118  TSH 4.000 1.880   A1C: No results found for: HGBA1C   Assessment/Plan 1. Senile purpura ([place]) Noted, continue to monitor.   2. Peripheral vascular disease (HCC) Stable, without pain, wounds or edema at this time.  3. Chronic cough Ongoing, he has been on breo but without diagnosis of COPD/asthma. Has persistant cough. Would benefit from PTFs and pulmonary recs. - Ambulatory referral to Pulmonology  4. Chronic combined systolic and diastolic heart failure (HCC) -stable at this time, follwed closely by cardiologist, has lasix PRN for weight gain.  Continues on losartan and jardiance.   5. Aortic atherosclerosis ([place]) -noted on imaging, continues on ASA and statin  6. Primary hypertension -Blood pressure well controlled Continue current medications Recheck metabolic panel   7. Chronic kidney disease, stage 3a (HCC) -Chronic and stable Encourage proper hydration Follow metabolic panel Avoid nephrotoxic meds (NSAIDS)  8. Paroxysmal atrial fibrillation (HCC) Reate controlled with amiodarone.   9. Acquired thrombophilia ([place]) -due to a fib, continues on eliquis.    Return in about 4 months (around 04/12/2022) for routine follow up . With PCP Amy Carlos American. [place], [facility] [phone]

## 2021-12-21 NOTE — Progress Notes (Unsigned)
Cardiology Office Note:    Date:  12/21/2021   ID:  Ronnie Joseph, DOB 13-Dec-1931, MRN [id number]  PCP:  [name], NP  Cardiologist:  Donato Heinz, MD  Electrophysiologist:  Vickie Epley, MD   Referring MD: [name], NP   No chief complaint on file.    History of Present Illness:    Ronnie Joseph is a 88 y.o. male with a hx of CAD status post PCI to LAD and LCx in 05/2019, ischemic cardiomyopathy (EF 25% post PCI, improved to 35-40 % on most recent echo), atrial fibrillation on Eliquis who presents for an initial visit.  His previous cardiologist was [name] in [facility].  Echocardiogram 09/26/2020 in [place] showed LVEF 35 to 40%, mildly reduced RV systolic function.  Cardiac catheterization on 06/01/2019 showed tandem diffuse 90% stenosis of mid LAD status post DES, 95% distal LCx stenosis status post DES.  Echocardiogram on 05/26/2019 showed LVEF 25 to 30%.  He was admitted from 12/16 through 10/02/20 with NSTEMI in [place].  He presented to ED with chest pain.  Was also found to have fever to 100.8, lactic acid of 12, hemoglobin 7.5.  Troponin peaked at 8300.  He received 2 units PRBCs.  He was evaluated by cardiology, who recommended medical management.  He has a history of GI bleeds with AVMs.  Underwent EGD/colonoscopy on 09/30/20 which showed duodenal AVMs status post APC and colonoscopy showed ascending colon AVM status post APC.  During admission was tolerating Plavix and apixaban without any further bleeding.  He was started on amiodarone for his atrial fibrillation, with plans for amiodarone 200 mg twice daily x1 month then 200 mg daily.  Echo 07/15/2021 showed EF 40 to 45%, normal RV function, mild MR, mild AI.  Since last clinic visit,  he presented to the ED earlier this month with lower extremity edema.  Reports started on January 1.  Left greater than right edema.  Ultrasound in the ED showed no DVT.  Has been using compression stockings  and took Lasix 20 mg x 5 days.  Reports edema significantly improved.  Denies any chest pain, dyspnea, lightheadedness, syncope, or palpitations.  Wt Readings from Last 3 Encounters:  12/11/21 172 lb 12.8 oz (78.4 kg)  10/31/21 168 lb 12.8 oz (76.6 kg)  10/23/21 163 lb (73.9 kg)    Past Medical History:  Diagnosis Date   Acute anemia 09/26/2020   Anemia due to acute blood loss 05/25/2019   Chronic maxillary sinusitis 08/06/2017   Coronary artery disease due to lipid rich plaque 07/27/2019   Essential hypertension 02/14/2003   Fainting 05/30/2019   Fall 05/25/1999   High cholesterol 03/14/2001   History of basal cell carcinoma 11/02/2019   History of pneumonia    History of snoring    History of upper respiratory infection    Irregular heartbeat 08/06/2003   Ischemic cardiomyopathy 05/30/2019   Mild cognitive impairment 05/15/2019   NSTEMI (non-ST elevated myocardial infarction) ([place]) 06/03/2019   Poor renal function 03/19/2005   Primary osteoarthritis of shoulder 10/24/2015   Psoriasis 12/29/2004   Rash and nonspecific skin eruption 04/30/2020   Schatzki's ring 08/07/2020   Sepsis with acute hypoxic respiratory failure ([place]) 09/26/2020   Sleep apnea 08/13/2003    Past Surgical History:  Procedure Laterality Date   APPENDECTOMY     CATARACT EXTRACTION     COLONOSCOPY     HERNIA REPAIR     RIGHT HEART CATHETERIZATION WITH ADENOSINE STUDY  UPPER GI ENDOSCOPY      Current Medications: No outpatient medications have been marked as taking for the 12/23/21 encounter (Appointment) with Donato Heinz, MD.     Allergies:   Lisinopril, Sulfamethoxazole-trimethoprim, Telmisartan, Atorvastatin, and Sulfa antibiotics   Social History   Socioeconomic History   Marital status: Widowed    Spouse name: Not on file   Number of children: Not on file   Years of education: Not on file   Highest education level: Not on file  Occupational History   Not on file   Tobacco Use   Smoking status: Former    Packs/day: 0.25    Types: Cigarettes    Quit date: 12/17/1958    Years since quitting: 63.0   Smokeless tobacco: Never  Vaping Use   Vaping Use: Never used  Substance and Sexual Activity   Alcohol use: Yes    Alcohol/week: 3.0 standard drinks    Types: 3 Standard drinks or equivalent per week   Drug use: Never   Sexual activity: Not Currently  Other Topics Concern   Not on file  Social History Narrative   Tobacco use, amount per day now: 0   Past tobacco use, amount per day: Less than 1 pack   How many years did you use tobacco: 5 years, stopped in 1960   Alcohol use (drinks per week): 4   Diet: N/A   Do you drink/eat things with caffeine: Yes.   Marital status: Widowed                                  What year were you married? 1960   Do you live in a house, apartment, assisted living, condo, trailer, etc.? Assisted Living.   Is it one or more stories? 1    How many persons live in your home? 1   Do you have pets in your home?( please list) No.   Highest Level of education completed? College   Current or past profession: Pharmacist   Do you exercise?  No.                                Type and how often?   Do you have a living will? Yes.   Do you have a DNR form?  Yes.                                 If not, do you want to discuss one?   Do you have signed POA/HPOA forms? Yes.                       If so, please bring to you appointment      Do you have any difficulty bathing or dressing yourself? Yes.   Do you have any difficulty preparing food or eating? Yes.   Do you have any difficulty managing your medications? Yes.   Do you have any difficulty managing your finances? No.   Do you have any difficulty affording your medications? No.   Social Determinants of Health   Financial Resource Strain: Not on file  Food Insecurity: Not on file  Transportation Needs: Not on file  Physical Activity: Not on file  Stress: Not on file   Social Connections: Not on file  Family History: The patient's family history includes Brain cancer in his brother; Breast cancer in his daughter; Celiac disease in his daughter; Colon cancer in an other family member; Colon polyps in an other family member; Diabetes in his son; Diabetes Mellitus I in his son; Esophageal cancer in an other family member; Heart attack in his father; Heart disease in his father; Lung disease in his mother; Pancreatic cancer in an other family member; Stomach cancer in an other family member.  ROS:   Please see the history of present illness.    All other systems reviewed and are negative.  EKGs/Labs/Other Studies Reviewed:    The following studies were reviewed today:   EKG:    09/19/21: Sinus bradycardia, rate 47, first-degree AV block, right bundle branch block, left anterior fascicular block, Q waves in inferior leads and V5/6 9/22-sinus bradycardia, rate 52, first-degree AV block, right bundle branch block, Q waves in inferior leads and lateral leads 6/22- The EKG ordered demonstrates Sinus rhythm, rate 59, non specific  intraventricular  conduction delay, first degree aV block, poor R wave progression, Q waves in inferior leads,  4/22- EKG was not ordered today 2/22- sinus bradycardia, rate 58, first-degree AV block, inferior Q waves, RBBB  Recent Labs: 03/13/2021: TSH 1.880 06/24/2021: Magnesium 2.6 10/23/2021: ALT 16; BUN 18; Creatinine, Ser 1.36; Potassium 5.2; Sodium 147 10/31/2021: Hemoglobin 12.0; Platelet Count 391  Recent Lipid Panel    Component Value Date/Time   CHOL 147 12/10/2020 0000   TRIG 176 (H) 12/10/2020 0000   HDL 51 12/10/2020 0000   CHOLHDL 2.9 12/10/2020 0000   LDLCALC 71 12/10/2020 0000    Physical Exam:    VS:  There were no vitals taken for this visit.    Wt Readings from Last 3 Encounters:  12/11/21 172 lb 12.8 oz (78.4 kg)  10/31/21 168 lb 12.8 oz (76.6 kg)  10/23/21 163 lb (73.9 kg)     GEN:  in no acute  distress HEENT: Normal NECK: No JVD CARDIAC: RRR, 2/6 systolic murmur RESPIRATORY:  Clear to auscultation without rales, wheezing or rhonchi  ABDOMEN: Soft, non-tender, non-distended MUSCULOSKELETAL:  Trace edema in bilateral LE SKIN: Warm and dry NEUROLOGIC:  Alert and oriented x 3 PSYCHIATRIC:  Normal affect   ASSESSMENT:    No diagnosis found.     PLAN:    CAD: Cardiac catheterization on 06/01/2019 showed tandem diffuse 90% stenosis of mid LAD status post DES, 95% distal LCx stenosis status post DES.  Admitted in [place] with NSTEMI 09/2020.  In setting of GI bleed, medical management was recommended. -Continue aspirin plus Eliquis.   -Continue rosuvastatin 20 mg daily  Sinus bradycardia: Heart rate previously in 40s, has improved with discontinuing metoprolol. 28s in clinic today.   Chronic combined systolic and diastolic heart failure: EF 35-40% on echo 09/2020.  Echo 07/15/2021 showed EF 40 to 45%, normal RV function, mild MR, mild AI.   -Discontinued Toprol-XL due to bradycardia as above -Continue losartan 50 mg daily -Continue Jardiance 10 mg daily -Continue spironolactone 12.5 mg daily -Appears euvolemic.  Continue Lasix 20 mg daily as needed for weight gain greater than 3 pounds in 1 day or 5 pounds in 1 week -Check CMET  LE edema: Recent ED visit with lower extremity edema.  Duplex negative for DVT.  Improved with Lasix 20 mg x 5 days.  Will check albumin level.  Continue Lasix 20 mg daily as needed for weight gain greater than 3 pounds 1 day  or 5 pounds in 1 week as above   Atrial fibrillation: Currently in sinus rhythm.  CHA2DS2-VASc score 5 (CHF, hypertension, age x2, CAD) -Continue amiodarone 200 mg daily -Discontinued Toprol-XL due to bradycardia as above -Continue Eliquis 2.5 mg BID given age over 46 and creatinine greater than 1.5.  Denies any recent bleeding. -While he has an elevated CHA2DS2-VASc score, he is a high risk anticoagulation candidate  given his history of GI bleeding and falls.  Referred to Dr. Quentin Ore for [facility] evaluation, recommended monitoring for now, if have further bleeding consider [facility]   Anemia: Had GI bleed, has iron deficiency anemia.    Denies any recent bleeding, reports difficult to tell given black-colored stools on iron supplements.  Reports has been having significant issues with constipation on p.o. iron.  Referred to GI for evaluation.  Has intermittently bleeding AVMs, GI advised against repeat endoscopic procedures given his age.  Was referred to  hematology, on p.o. iron   Hypertension: On spironolactone, and losartan.  Appears controlled   Hyperlipidemia: On rosuvastatin 20 mg daily.  LDL 71 on 12/10/2020   Hypothyroidism: TSH has been elevated, PCP adjusting levothyroxine dose.  TSH 03/13/21 was normal    RTC in***    Medication Adjustments/Labs and Tests Ordered: Current medicines are reviewed at length with the patient today.  Concerns regarding medicines are outlined above.  No orders of the defined types were placed in this encounter.    No orders of the defined types were placed in this encounter.     There are no Patient Instructions on file for this visit.      Signed, Donato Heinz, MD  12/21/2021 3:03 PM    [facility]

## 2021-12-23 ENCOUNTER — Encounter: Payer: Self-pay | Admitting: Cardiology

## 2021-12-23 ENCOUNTER — Ambulatory Visit (INDEPENDENT_AMBULATORY_CARE_PROVIDER_SITE_OTHER): Payer: Medicare Other | Admitting: Cardiology

## 2021-12-23 ENCOUNTER — Other Ambulatory Visit: Payer: Self-pay

## 2021-12-23 VITALS — BP 142/60 | HR 62 | Ht 70.0 in | Wt 176.6 lb

## 2021-12-23 DIAGNOSIS — I5042 Chronic combined systolic (congestive) and diastolic (congestive) heart failure: Secondary | ICD-10-CM | POA: Diagnosis not present

## 2021-12-23 DIAGNOSIS — I2510 Atherosclerotic heart disease of native coronary artery without angina pectoris: Secondary | ICD-10-CM | POA: Diagnosis not present

## 2021-12-23 DIAGNOSIS — I10 Essential (primary) hypertension: Secondary | ICD-10-CM

## 2021-12-23 DIAGNOSIS — I4891 Unspecified atrial fibrillation: Secondary | ICD-10-CM

## 2021-12-23 DIAGNOSIS — R001 Bradycardia, unspecified: Secondary | ICD-10-CM

## 2021-12-23 DIAGNOSIS — R600 Localized edema: Secondary | ICD-10-CM

## 2021-12-23 DIAGNOSIS — D649 Anemia, unspecified: Secondary | ICD-10-CM

## 2021-12-23 LAB — BASIC METABOLIC PANEL
BUN/Creatinine Ratio: 13 (ref 10–24)
BUN: 18 mg/dL (ref 8–27)
CO2: 26 mmol/L (ref 20–29)
Calcium: 9.7 mg/dL (ref 8.6–10.2)
Chloride: 105 mmol/L (ref 96–106)
Creatinine, Ser: 1.35 mg/dL — ABNORMAL HIGH (ref 0.76–1.27)
Glucose: 109 mg/dL — ABNORMAL HIGH (ref 70–99)
Potassium: 5.4 mmol/L — ABNORMAL HIGH (ref 3.5–5.2)
Sodium: 145 mmol/L — ABNORMAL HIGH (ref 134–144)
eGFR: 50 mL/min/{1.73_m2} — ABNORMAL LOW (ref >59)

## 2021-12-23 LAB — LIPID PANEL
Chol/HDL Ratio: 2.1 ratio (ref 0.0–5.0)
Cholesterol, Total: 130 mg/dL (ref 100–199)
HDL: 63 mg/dL (ref >39)
LDL Chol Calc (NIH): 51 mg/dL (ref 0–99)
Triglycerides: 81 mg/dL (ref 0–149)
VLDL Cholesterol Cal: 16 mg/dL (ref 5–40)

## 2021-12-23 NOTE — Patient Instructions (Addendum)
Medication Instructions:  ?Your physician recommends that you continue on your current medications as directed. Please refer to the Current Medication list given to you today. ? ?*If you need a refill on your cardiac medications before your next appointment, please call your pharmacy* ? ? ?Lab Work: ?BMET, Lipid today ? ?If you have labs (blood work) drawn today and your tests are completely normal, you will receive your results only by: ?MyChart Message (if you have MyChart) OR ?A paper copy in the mail ?If you have any lab test that is abnormal or we need to change your treatment, we will call you to review the results. ? ?Follow-Up: ?At [facility], you and your health needs are our priority.  As part of our continuing mission to provide you with exceptional heart care, we have created designated Provider Care Teams.  These Care Teams include your primary Cardiologist (physician) and Advanced Practice Providers (APPs -  Physician Assistants and Nurse Practitioners) who all work together to provide you with the care you need, when you need it. ? ?We recommend signing up for the patient portal called "MyChart".  Sign up information is provided on this After Visit Summary.  MyChart is used to connect with patients for Virtual Visits (Telemedicine).  Patients are able to view lab/test results, encounter notes, upcoming appointments, etc.  Non-urgent messages can be sent to your provider as well.   ?To learn more about what you can do with MyChart, go to [email or web address].   ? ?Your next appointment:   ?6 month(s) ? ?The format for your next appointment:   ?In Person ? ?Provider:   ?Donato Heinz, MD   ? ? ? ?

## 2021-12-24 ENCOUNTER — Other Ambulatory Visit: Payer: Self-pay | Admitting: *Deleted

## 2021-12-24 ENCOUNTER — Encounter: Payer: Self-pay | Admitting: Cardiology

## 2021-12-24 ENCOUNTER — Telehealth: Payer: Self-pay | Admitting: Cardiology

## 2021-12-24 DIAGNOSIS — E875 Hyperkalemia: Secondary | ICD-10-CM

## 2021-12-24 NOTE — Telephone Encounter (Signed)
Called [facility] to confirm order date (see mychart message)-requested order be refaxed.   Refaxed order. Will confirm order received tomorrow.  ?

## 2021-12-24 NOTE — Telephone Encounter (Signed)
[facility] calling, they said that they only received the cover sheet of the lab order and did not received the order itself. Need to refax it to them ?

## 2021-12-24 NOTE — Telephone Encounter (Signed)
Returned call to [facility]- advised that fax has been resent over. They state that fax has not been fully received yet. Advised them that [facility], RN will refax over. They verbalized understanding.  ?

## 2021-12-24 NOTE — Telephone Encounter (Signed)
Ronnie Joseph with [facility] is calling stating she received a fax to her attention from the office this morning, but it was only one page so she is unsure what it was in regards to. She is requesting it be re-faxed.  ?

## 2021-12-25 NOTE — Telephone Encounter (Signed)
Called to confirm order received.   States fax received but was accidentally put in shred bin, request to refax.   ? ?Order refaxed with date to be completed 3/23 (labs only completed on Tuesday and Thursday at facility). ?

## 2021-12-25 NOTE — Telephone Encounter (Signed)
Calling back to say they only received the cover sheet of the fax. Please advise [phone] ?

## 2021-12-25 NOTE — Telephone Encounter (Signed)
Attempt to return call-lmtcb. ? ?Order faxed multiple times from different fax machines-unsure why only receiving cover sheet?   ? ? ?

## 2021-12-26 ENCOUNTER — Other Ambulatory Visit: Payer: Self-pay | Admitting: Orthopedic Surgery

## 2021-12-26 DIAGNOSIS — E039 Hypothyroidism, unspecified: Secondary | ICD-10-CM

## 2021-12-26 NOTE — Telephone Encounter (Signed)
Ronnie Joseph with [facility] calling back. Phone: [phone] ? ? ?

## 2021-12-26 NOTE — Telephone Encounter (Signed)
Spoke to Ronnie Joseph at [facility] Bmet order faxed to fax # [phone]. ?

## 2021-12-29 ENCOUNTER — Other Ambulatory Visit: Payer: Self-pay

## 2021-12-29 ENCOUNTER — Telehealth: Payer: Self-pay | Admitting: Cardiology

## 2021-12-29 DIAGNOSIS — E875 Hyperkalemia: Secondary | ICD-10-CM

## 2021-12-29 DIAGNOSIS — I5042 Chronic combined systolic (congestive) and diastolic (congestive) heart failure: Secondary | ICD-10-CM

## 2021-12-29 NOTE — Telephone Encounter (Signed)
Received call from [place] from [facility] for a new BMP order because they shredded the last BMP order. New order for BMP placed and faxed to Aissatou at [place]. ?

## 2021-12-29 NOTE — Telephone Encounter (Signed)
Fax x3 didn't go through. Spoke with Scarlette Shorts at [facility] with [facility]. He gave me his fax number [phone] and he will give BMP order to [facility]. [facility] nurse. ?

## 2021-12-29 NOTE — Telephone Encounter (Signed)
New Message:  ? ? ? ? ?She need a lab order ?

## 2021-12-29 NOTE — Telephone Encounter (Signed)
?  Crystal w/ [facility] calling back saying that they are only receiving fax cover sheet and not the actual order for BMET. Verfied this fax number twice with Crystal [phone] ?

## 2021-12-30 ENCOUNTER — Encounter: Payer: Self-pay | Admitting: Pulmonary Disease

## 2021-12-30 ENCOUNTER — Other Ambulatory Visit: Payer: Self-pay

## 2021-12-30 ENCOUNTER — Ambulatory Visit (INDEPENDENT_AMBULATORY_CARE_PROVIDER_SITE_OTHER): Payer: Medicare Other | Admitting: Pulmonary Disease

## 2021-12-30 VITALS — BP 128/70 | HR 65 | Temp 98.1°F | Ht 70.0 in | Wt 171.2 lb

## 2021-12-30 DIAGNOSIS — R0982 Postnasal drip: Secondary | ICD-10-CM

## 2021-12-30 DIAGNOSIS — R053 Chronic cough: Secondary | ICD-10-CM | POA: Diagnosis not present

## 2021-12-30 MED ORDER — IPRATROPIUM BROMIDE 0.03 % NA SOLN: 2.0000 | Freq: Two times a day (BID) | NASAL | 12 refills | Status: DC

## 2021-12-30 NOTE — Progress Notes (Signed)
Synopsis: Referred in March 2023 for Cough by Abbey Chatters, NP  Subjective:   PATIENT ID: Ronnie Joseph GENDER: male DOB: [birth year], MRN: [id number]   HPI  Chief Complaint  Patient presents with   Consult    Referred by PCP for productive cough for the past 2 years. Phlegm tends to be yellowish in the mornings then turns clear during the day. Denies any increased SOB or wheezing.    Ronnie Joseph is an 88 year old male, former smoker with obstructive sleep apnea, hypertension, ischemic cardiomyopathy and chronic sinusitis who is referred to pulmonary clinic for chronic cough.   PCP note reviewed from 12/11/21. He has progressive cough over the past 6 months. He is taking Breo for presumed COPD/asthma. Has been taking for over year now. He is coughing up yellowish sputum in the morning which becomes clear throughout the day. He denies wheezing or shortness of breath. He does have sinus congestion with post-nasal drainage. He denies GERD. He has allergies throughout the year.   He is currently living in a retirement home. He smoked for a brief period and quit in 1960. He is a retired Teacher, early years/pre. He denies any harmful dust or chemical exposures. He enjoyed fishing and photography as hobbies.   Past Medical History:  Diagnosis Date   Acute anemia 09/26/2020   Anemia due to acute blood loss 05/25/2019   Chronic maxillary sinusitis 08/06/2017   Coronary artery disease due to lipid rich plaque 07/27/2019   Essential hypertension 02/14/2003   Fainting 05/30/2019   Fall 05/25/1999   High cholesterol 03/14/2001   History of basal cell carcinoma 11/02/2019   History of pneumonia    History of snoring    History of upper respiratory infection    Irregular heartbeat 08/06/2003   Ischemic cardiomyopathy 05/30/2019   Mild cognitive impairment 05/15/2019   NSTEMI (non-ST elevated myocardial infarction) (HCC) 06/03/2019   Poor renal function 03/19/2005   Primary osteoarthritis of shoulder  10/24/2015   Psoriasis 12/29/2004   Rash and nonspecific skin eruption 04/30/2020   Schatzki's ring 08/07/2020   Sepsis with acute hypoxic respiratory failure (HCC) 09/26/2020   Sleep apnea 08/13/2003     Family History  Problem Relation Age of Onset   Lung disease Mother    Heart disease Father    Heart attack Father    Brain cancer Brother    Breast cancer Daughter    Celiac disease Daughter    Diabetes Son    Diabetes Mellitus I Son    Stomach cancer Other    Pancreatic cancer Other    Esophageal cancer Other    Colon polyps Other    Colon cancer Other      Social History   Socioeconomic History   Marital status: Widowed    Spouse name: Not on file   Number of children: Not on file   Years of education: Not on file   Highest education level: Not on file  Occupational History   Not on file  Tobacco Use   Smoking status: Former    Packs/day: 0.25    Types: Cigarettes    Quit date: 12/17/1958    Years since quitting: 63.0   Smokeless tobacco: Never  Vaping Use   Vaping Use: Never used  Substance and Sexual Activity   Alcohol use: Yes    Alcohol/week: 3.0 standard drinks    Types: 3 Standard drinks or equivalent per week   Drug use: Never   Sexual activity: Not Currently  Other Topics Concern   Not on file  Social History Narrative   Tobacco use, amount per day now: 0   Past tobacco use, amount per day: Less than 1 pack   How many years did you use tobacco: 5 years, stopped in 1960   Alcohol use (drinks per week): 4   Diet: N/A   Do you drink/eat things with caffeine: Yes.   Marital status: Widowed                                  What year were you married? 1960   Do you live in a house, apartment, assisted living, condo, trailer, etc.? Assisted Living.   Is it one or more stories? 1    How many persons live in your home? 1   Do you have pets in your home?( please list) No.   Highest Level of education completed? College   Current or past profession:  Pharmacist   Do you exercise?  No.                                Type and how often?   Do you have a living will? Yes.   Do you have a DNR form?  Yes.                                 If not, do you want to discuss one?   Do you have signed POA/HPOA forms? Yes.                       If so, please bring to you appointment      Do you have any difficulty bathing or dressing yourself? Yes.   Do you have any difficulty preparing food or eating? Yes.   Do you have any difficulty managing your medications? Yes.   Do you have any difficulty managing your finances? No.   Do you have any difficulty affording your medications? No.   Social Determinants of Health   Financial Resource Strain: Not on file  Food Insecurity: Not on file  Transportation Needs: Not on file  Physical Activity: Not on file  Stress: Not on file  Social Connections: Not on file  Intimate Partner Violence: Not on file     Allergies  Allergen Reactions   Lisinopril Swelling   Sulfamethoxazole-Trimethoprim Other (See Comments)   Telmisartan Hives   Atorvastatin Rash   Sulfa Antibiotics Rash     Outpatient Medications Prior to Visit  Medication Sig Dispense Refill   acetaminophen (TYLENOL) 500 MG tablet Take 500 mg by mouth 2 (two) times daily.     amiodarone (PACERONE) 200 MG tablet Take 1 tablet (200 mg total) by mouth daily. 90 tablet 3   apixaban (ELIQUIS) 2.5 MG TABS tablet Take 1 tablet (2.5 mg total) by mouth 2 (two) times daily. 180 tablet 3   aspirin EC 81 MG tablet Take 1 tablet (81 mg total) by mouth daily. Swallow whole. 30 tablet 11   bisacodyl (DULCOLAX) 5 MG EC tablet Take 5 mg by mouth daily as needed for moderate constipation.     CALCIUM CITRATE PO Take 1 tablet by mouth 2 (two) times daily.     empagliflozin (JARDIANCE) 10 MG TABS tablet Take 1 tablet (10  mg total) by mouth daily before breakfast. 90 tablet 3   ferrous sulfate 325 (65 FE) MG tablet Take 325 mg by mouth daily.     fluticasone  (FLONASE) 50 MCG/ACT nasal spray Place 2 sprays into both nostrils 2 (two) times daily.     fluticasone furoate-vilanterol (BREO ELLIPTA) 100-25 MCG/ACT AEPB USE 1 INHALATION ORALLY    DAILY 180 each 1   furosemide (LASIX) 20 MG tablet Take 20 mg once daily for 5 days and then take as needed for weight gain of 3 lbs in one day or 5 lbs in one week 5 tablet 0   levocetirizine (XYZAL) 5 MG tablet TAKE 1 TABLET DAILY (Patient taking differently: 2 tablets daily) 90 tablet 1   levothyroxine (SYNTHROID) 88 MCG tablet Take 1 tablet (88 mcg total) by mouth daily. 90 tablet 1   losartan (COZAAR) 50 MG tablet Take 1 tablet (50 mg total) by mouth in the morning. 90 tablet 3   montelukast (SINGULAIR) 10 MG tablet TAKE 1 TABLET DAILY 90 tablet 1   Multiple Vitamin (MULTIVITAMIN PO) Take 1 tablet by mouth daily.     pantoprazole (PROTONIX) 40 MG tablet TAKE 1 TABLET DAILY 90 tablet 1   polyethylene glycol (MIRALAX / GLYCOLAX) 17 g packet Take 17 g by mouth daily as needed.     rosuvastatin (CRESTOR) 20 MG tablet Take 1 tablet (20 mg total) by mouth daily. 30 tablet 0   Saw Palmetto, Serenoa repens, (SAW PALMETTO FRUIT PO) Take 450 mg by mouth 2 (two) times daily.     senna-docusate (SENOKOT-S) 8.6-50 MG tablet Take 1 tablet by mouth 2 (two) times daily.     triamcinolone cream (KENALOG) 0.1 % Apply topically daily.     No facility-administered medications prior to visit.   Review of Systems  Constitutional:  Negative for chills, fever, malaise/fatigue and weight loss.  HENT:  Positive for congestion. Negative for sinus pain and sore throat.   Eyes: Negative.   Respiratory:  Positive for cough. Negative for hemoptysis, sputum production, shortness of breath and wheezing.   Cardiovascular:  Negative for chest pain, palpitations, orthopnea, claudication and leg swelling.  Gastrointestinal:  Negative for abdominal pain, heartburn, nausea and vomiting.  Genitourinary: Negative.   Musculoskeletal:  Negative  for joint pain and myalgias.  Skin:  Negative for rash.  Neurological:  Negative for weakness.  Endo/Heme/Allergies: Negative.   Psychiatric/Behavioral: Negative.     Objective:   Vitals:   12/30/21 0933  BP: 128/70  Pulse: 65  Temp: 98.1 F (36.7 C)  TempSrc: Oral  SpO2: 96%  Weight: 171 lb 3.2 oz (77.7 kg)  Height: 5\' 10"  (1.778 m)    Physical Exam Constitutional:      General: He is not in acute distress. HENT:     Head: Normocephalic and atraumatic.  Eyes:     Extraocular Movements: Extraocular movements intact.     Conjunctiva/sclera: Conjunctivae normal.     Pupils: Pupils are equal, round, and reactive to light.  Cardiovascular:     Rate and Rhythm: Normal rate and regular rhythm.     Pulses: Normal pulses.     Heart sounds: Normal heart sounds. No murmur heard. Abdominal:     General: Bowel sounds are normal.     Palpations: Abdomen is soft.  Musculoskeletal:     Right lower leg: No edema.     Left lower leg: No edema.  Lymphadenopathy:     Cervical: No cervical adenopathy.  Skin:  General: Skin is warm and dry.  Neurological:     General: No focal deficit present.     Mental Status: He is alert.  Psychiatric:        Mood and Affect: Mood normal.        Behavior: Behavior normal.        Thought Content: Thought content normal.        Judgment: Judgment normal.   CBC    Component Value Date/Time   WBC 9.3 10/31/2021 1102   WBC 8.4 10/13/2021 1731   RBC 3.48 (L) 10/31/2021 1102   HGB 12.0 (L) 10/31/2021 1102   HGB 12.8 (L) 09/19/2021 1158   HCT 35.6 (L) 10/31/2021 1102   HCT 38.5 09/19/2021 1158   PLT 391 10/31/2021 1102   PLT 307 09/19/2021 1158   MCV 102.3 (H) 10/31/2021 1102   MCV 104 (H) 09/19/2021 1158   MCH 34.5 (H) 10/31/2021 1102   MCHC 33.7 10/31/2021 1102   RDW 13.1 10/31/2021 1102   RDW 11.8 09/19/2021 1158   LYMPHSABS 0.8 10/31/2021 1102   MONOABS 1.1 (H) 10/31/2021 1102   EOSABS 0.0 10/31/2021 1102   BASOSABS 0.0 10/31/2021  1102   BMP Latest Ref Rng & Units 12/23/2021 10/23/2021 10/13/2021  Glucose 70 - 99 mg/dL 657(Q) 78 98  BUN 8 - 27 mg/dL 18 18 15   Creatinine 0.76 - 1.27 mg/dL 4.69(G) 2.95(M) 8.41  BUN/Creat Ratio 10 - 24 13 13  -  Sodium 134 - 144 mmol/L 145(H) 147(H) 140  Potassium 3.5 - 5.2 mmol/L 5.4(H) 5.2 4.2  Chloride 96 - 106 mmol/L 105 103 105  CO2 20 - 29 mmol/L 26 25 27   Calcium 8.6 - 10.2 mg/dL 9.7 9.4 9.3   Chest imaging: CT Chest 03/20/21 No abnormal findings. LLL nodule likely intrapulmonary lymph node.   PFT: No flowsheet data found.  Labs:  Path:  Echo 07/15/21: LV EF 40-45%. Grade I diastolic dysfunction. RV systolic function and size are normal.   Heart Catheterization:  Assessment & Plan:   Chronic cough - Plan: Pulmonary Function Test, ipratropium (ATROVENT) 0.03 % nasal spray  Post-nasal drainage - Plan: ipratropium (ATROVENT) 0.03 % nasal spray  Discussion: Ronnie Joseph is an 88 year old male, former smoker with obstructive sleep apnea, hypertension, ischemic cardiomyopathy and chronic sinusitis who is referred to pulmonary clinic for chronic cough.   His cough appears related to post-nasal drainage. He is to continue fluticasone nasal spray and start ipratropium nasal sprays. He is to continue Xyzal allergy medicine.  He is to continue breo ellipta 1 puff daily.   Follow up in 2 months with pulmonary function tests.  Melody Comas, MD [facility] Office: [phone]    Current Outpatient Medications:    acetaminophen (TYLENOL) 500 MG tablet, Take 500 mg by mouth 2 (two) times daily., Disp: , Rfl:    amiodarone (PACERONE) 200 MG tablet, Take 1 tablet (200 mg total) by mouth daily., Disp: 90 tablet, Rfl: 3   apixaban (ELIQUIS) 2.5 MG TABS tablet, Take 1 tablet (2.5 mg total) by mouth 2 (two) times daily., Disp: 180 tablet, Rfl: 3   aspirin EC 81 MG tablet, Take 1 tablet (81 mg total) by mouth daily. Swallow whole., Disp: 30 tablet, Rfl: 11    bisacodyl (DULCOLAX) 5 MG EC tablet, Take 5 mg by mouth daily as needed for moderate constipation., Disp: , Rfl:    CALCIUM CITRATE PO, Take 1 tablet by mouth 2 (two) times daily., Disp: ,  Rfl:    empagliflozin (JARDIANCE) 10 MG TABS tablet, Take 1 tablet (10 mg total) by mouth daily before breakfast., Disp: 90 tablet, Rfl: 3   ferrous sulfate 325 (65 FE) MG tablet, Take 325 mg by mouth daily., Disp: , Rfl:    fluticasone (FLONASE) 50 MCG/ACT nasal spray, Place 2 sprays into both nostrils 2 (two) times daily., Disp: , Rfl:    fluticasone furoate-vilanterol (BREO ELLIPTA) 100-25 MCG/ACT AEPB, USE 1 INHALATION ORALLY    DAILY, Disp: 180 each, Rfl: 1   furosemide (LASIX) 20 MG tablet, Take 20 mg once daily for 5 days and then take as needed for weight gain of 3 lbs in one day or 5 lbs in one week, Disp: 5 tablet, Rfl: 0   ipratropium (ATROVENT) 0.03 % nasal spray, Place 2 sprays into both nostrils every 12 (twelve) hours., Disp: 30 mL, Rfl: 12   levocetirizine (XYZAL) 5 MG tablet, TAKE 1 TABLET DAILY (Patient taking differently: 2 tablets daily), Disp: 90 tablet, Rfl: 1   levothyroxine (SYNTHROID) 88 MCG tablet, Take 1 tablet (88 mcg total) by mouth daily., Disp: 90 tablet, Rfl: 1   losartan (COZAAR) 50 MG tablet, Take 1 tablet (50 mg total) by mouth in the morning., Disp: 90 tablet, Rfl: 3   montelukast (SINGULAIR) 10 MG tablet, TAKE 1 TABLET DAILY, Disp: 90 tablet, Rfl: 1   Multiple Vitamin (MULTIVITAMIN PO), Take 1 tablet by mouth daily., Disp: , Rfl:    pantoprazole (PROTONIX) 40 MG tablet, TAKE 1 TABLET DAILY, Disp: 90 tablet, Rfl: 1   polyethylene glycol (MIRALAX / GLYCOLAX) 17 g packet, Take 17 g by mouth daily as needed., Disp: , Rfl:    rosuvastatin (CRESTOR) 20 MG tablet, Take 1 tablet (20 mg total) by mouth daily., Disp: 30 tablet, Rfl: 0   Saw Palmetto, Serenoa repens, (SAW PALMETTO FRUIT PO), Take 450 mg by mouth 2 (two) times daily., Disp: , Rfl:    senna-docusate (SENOKOT-S) 8.6-50 MG  tablet, Take 1 tablet by mouth 2 (two) times daily., Disp: , Rfl:    triamcinolone cream (KENALOG) 0.1 %, Apply topically daily., Disp: , Rfl:

## 2021-12-30 NOTE — Telephone Encounter (Signed)
Sharyn Lull w/ [facility] called again stating that they still have not received Mr. Akhavan BMP orders. They said what was faxed to [facility] yesterday afternoon they also did not receive. ?

## 2021-12-30 NOTE — Telephone Encounter (Signed)
Returned call to [facility] states they still are only receiving the face sheet.  Advised unsure why this is happening as we have tried multiple times from multiple fax machines.   She states there is not another fax # to send to.  Will reattempt via Epic and fax.  ?

## 2021-12-30 NOTE — Telephone Encounter (Signed)
Re-faxed blood work to [phone] from a different fax machine in the office. The transmission log states OK. ?

## 2021-12-30 NOTE — Patient Instructions (Signed)
Your cough may be related to post-nasal drainage and obstructive lung disease. ? ?Continue on breo ellipta 1 puff daily ?- rinse mouth out after each use ? ?Continue fluticasone nasal spray and Xyzal allergy medicine ? ?Start ipratropium nasal spray 2 sprays per nostril twice daily ? ?Follow up in 2 months for pulmonary function tests ?

## 2022-01-01 LAB — BASIC METABOLIC PANEL
BUN: 23 — AB (ref 4–21)
CO2: 25 — AB (ref 13–22)
Chloride: 103 (ref 99–108)
Creatinine: 1.4 — AB (ref 0.6–1.3)
Glucose: 115
Potassium: 4.9 mEq/L (ref 3.5–5.1)
Sodium: 141 (ref 137–147)

## 2022-01-01 LAB — COMPREHENSIVE METABOLIC PANEL: Calcium: 9.7 (ref 8.7–10.7)

## 2022-01-02 ENCOUNTER — Encounter: Payer: Self-pay | Admitting: *Deleted

## 2022-01-02 NOTE — Progress Notes (Signed)
[facility] ?

## 2022-01-05 ENCOUNTER — Other Ambulatory Visit: Payer: Self-pay

## 2022-01-05 ENCOUNTER — Emergency Department (HOSPITAL_COMMUNITY): Payer: Medicare Other

## 2022-01-05 ENCOUNTER — Emergency Department (HOSPITAL_COMMUNITY)
Admission: EM | Admit: 2022-01-05 | Discharge: 2022-01-05 | Disposition: A | Discharge status: Home / Self Care | Payer: Medicare Other | Attending: Emergency Medicine | Admitting: Emergency Medicine

## 2022-01-05 ENCOUNTER — Encounter (HOSPITAL_COMMUNITY): Payer: Self-pay

## 2022-01-05 DIAGNOSIS — W1839XA Other fall on same level, initial encounter: Secondary | ICD-10-CM | POA: Insufficient documentation

## 2022-01-05 DIAGNOSIS — J189 Pneumonia, unspecified organism: Secondary | ICD-10-CM | POA: Insufficient documentation

## 2022-01-05 DIAGNOSIS — S51811A Laceration without foreign body of right forearm, initial encounter: Secondary | ICD-10-CM | POA: Diagnosis not present

## 2022-01-05 DIAGNOSIS — M47812 Spondylosis without myelopathy or radiculopathy, cervical region: Secondary | ICD-10-CM | POA: Insufficient documentation

## 2022-01-05 DIAGNOSIS — J324 Chronic pansinusitis: Secondary | ICD-10-CM | POA: Diagnosis not present

## 2022-01-05 DIAGNOSIS — I700 Atherosclerosis of aorta: Secondary | ICD-10-CM | POA: Diagnosis not present

## 2022-01-05 DIAGNOSIS — Z7901 Long term (current) use of anticoagulants: Secondary | ICD-10-CM | POA: Insufficient documentation

## 2022-01-05 DIAGNOSIS — R780 Finding of alcohol in blood: Secondary | ICD-10-CM | POA: Insufficient documentation

## 2022-01-05 DIAGNOSIS — W19XXXA Unspecified fall, initial encounter: Secondary | ICD-10-CM

## 2022-01-05 DIAGNOSIS — S0181XA Laceration without foreign body of other part of head, initial encounter: Secondary | ICD-10-CM | POA: Diagnosis not present

## 2022-01-05 DIAGNOSIS — S7011XA Contusion of right thigh, initial encounter: Secondary | ICD-10-CM | POA: Insufficient documentation

## 2022-01-05 DIAGNOSIS — Z7982 Long term (current) use of aspirin: Secondary | ICD-10-CM | POA: Insufficient documentation

## 2022-01-05 DIAGNOSIS — S0990XA Unspecified injury of head, initial encounter: Secondary | ICD-10-CM | POA: Diagnosis present

## 2022-01-05 DIAGNOSIS — Y907 Blood alcohol level of 200-239 mg/100 ml: Secondary | ICD-10-CM | POA: Insufficient documentation

## 2022-01-05 DIAGNOSIS — M85859 Other specified disorders of bone density and structure, unspecified thigh: Secondary | ICD-10-CM | POA: Insufficient documentation

## 2022-01-05 DIAGNOSIS — R4182 Altered mental status, unspecified: Secondary | ICD-10-CM | POA: Diagnosis present

## 2022-01-05 LAB — CBC WITH DIFFERENTIAL/PLATELET
Abs Immature Granulocytes: 0.03 10*3/uL (ref 0.00–0.07)
Basophils Absolute: 0.1 10*3/uL (ref 0.0–0.1)
Basophils Relative: 1 %
Eosinophils Absolute: 0.5 10*3/uL (ref 0.0–0.5)
Eosinophils Relative: 6 %
HCT: 38.9 % — ABNORMAL LOW (ref 39.0–52.0)
Hemoglobin: 12.6 g/dL — ABNORMAL LOW (ref 13.0–17.0)
Immature Granulocytes: 0 %
Lymphocytes Relative: 18 %
Lymphs Abs: 1.5 10*3/uL (ref 0.7–4.0)
MCH: 34.6 pg — ABNORMAL HIGH (ref 26.0–34.0)
MCHC: 32.4 g/dL (ref 30.0–36.0)
MCV: 106.9 fL — ABNORMAL HIGH (ref 80.0–100.0)
Monocytes Absolute: 0.9 10*3/uL (ref 0.1–1.0)
Monocytes Relative: 11 %
Neutro Abs: 5.3 10*3/uL (ref 1.7–7.7)
Neutrophils Relative %: 64 %
Platelets: 275 10*3/uL (ref 150–400)
RBC: 3.64 MIL/uL — ABNORMAL LOW (ref 4.22–5.81)
RDW: 14.1 % (ref 11.5–15.5)
WBC: 8.4 10*3/uL (ref 4.0–10.5)
nRBC: 0 % (ref 0.0–0.2)

## 2022-01-05 LAB — COMPREHENSIVE METABOLIC PANEL
ALT: 19 U/L (ref 0–44)
AST: 24 U/L (ref 15–41)
Albumin: 3.7 g/dL (ref 3.5–5.0)
Alkaline Phosphatase: 30 U/L — ABNORMAL LOW (ref 38–126)
Anion gap: 10 (ref 5–15)
BUN: 17 mg/dL (ref 8–23)
CO2: 22 mmol/L (ref 22–32)
Calcium: 9 mg/dL (ref 8.9–10.3)
Chloride: 110 mmol/L (ref 98–111)
Creatinine, Ser: 1.29 mg/dL — ABNORMAL HIGH (ref 0.61–1.24)
GFR, Estimated: 53 mL/min — ABNORMAL LOW (ref >60)
Glucose, Bld: 81 mg/dL (ref 70–99)
Potassium: 3.9 mmol/L (ref 3.5–5.1)
Sodium: 142 mmol/L (ref 135–145)
Total Bilirubin: 0.3 mg/dL (ref 0.3–1.2)
Total Protein: 6.4 g/dL — ABNORMAL LOW (ref 6.5–8.1)

## 2022-01-05 LAB — I-STAT CHEM 8, ED
BUN: 20 mg/dL (ref 8–23)
Calcium, Ion: 1.05 mmol/L — ABNORMAL LOW (ref 1.15–1.40)
Chloride: 108 mmol/L (ref 98–111)
Creatinine, Ser: 1.7 mg/dL — ABNORMAL HIGH (ref 0.61–1.24)
Glucose, Bld: 81 mg/dL (ref 70–99)
HCT: 39 % (ref 39.0–52.0)
Hemoglobin: 13.3 g/dL (ref 13.0–17.0)
Potassium: 3.9 mmol/L (ref 3.5–5.1)
Sodium: 142 mmol/L (ref 135–145)
TCO2: 26 mmol/L (ref 22–32)

## 2022-01-05 LAB — RAPID URINE DRUG SCREEN, HOSP PERFORMED
Amphetamines: NOT DETECTED
Barbiturates: NOT DETECTED
Benzodiazepines: NOT DETECTED
Cocaine: NOT DETECTED
Opiates: NOT DETECTED
Tetrahydrocannabinol: NOT DETECTED

## 2022-01-05 LAB — ETHANOL: Alcohol, Ethyl (B): 213 mg/dL — ABNORMAL HIGH (ref <10)

## 2022-01-05 MED ORDER — DOXYCYCLINE HYCLATE 100 MG PO CAPS: 100.0000 mg | ORAL_CAPSULE | Freq: Two times a day (BID) | ORAL | 0 refills | Status: DC

## 2022-01-05 MED ORDER — LIDOCAINE-EPINEPHRINE (PF) 2 %-1:200000 IJ SOLN
10.0000 mL | Freq: Once | INTRAMUSCULAR | Status: AC
  Administered 2022-01-05: 10 mL
  Filled 2022-01-05: qty 20

## 2022-01-05 MED ORDER — IOHEXOL 350 MG/ML SOLN
100.0000 mL | Freq: Once | INTRAVENOUS | Status: AC | PRN
  Administered 2022-01-05: 100 mL via INTRAVENOUS

## 2022-01-05 NOTE — Discharge Instructions (Addendum)
The stitches can be taken out in around 5 to 7 days. ? ?Please give him his morning medicines when he gets back from the hospital. ?

## 2022-01-05 NOTE — ED Provider Notes (Addendum)
?[facility] ?Provider Note ? ? ?CSN: [id number] ?Arrival date & time: 01/05/22  0254 ? ?  ? ?History ? ?Chief Complaint  ?Patient presents with  ? Fall  ?  Level 2 on thinners   ? ? ?Ronnie Joseph is a 88 y.o. male. ? ? ?Fall ?Patient brought in as a level 2 trauma after fall.  Confused.  He is on anticoagulation.  Reported last seen around 4 in the morning seen in the bedside with a drink in his hand.  Found with hematoma and laceration to forehead.  Also forearm skin tear and right thigh bruise.  Patient does not know what happened.  Cervical collar in place. ? ?  ? ?Home Medications ?Prior to Admission medications   ?Medication Sig Start Date End Date Taking? Authorizing Provider  ?acetaminophen (TYLENOL) 500 MG tablet Take 1,000 mg by mouth every 8 (eight) hours as needed for mild pain.   Yes [provider]  ?amiodarone (PACERONE) 200 MG tablet Take 1 tablet (200 mg total) by mouth daily. 07/08/21  Yes Donato Heinz, MD  ?aspirin 81 MG chewable tablet Chew 81 mg by mouth daily.   Yes [provider]  ?CALCIUM CITRATE PO Take 200 mg by mouth 2 (two) times daily.   Yes [provider]  ?doxycycline (VIBRAMYCIN) 100 MG capsule Take 1 capsule (100 mg total) by mouth 2 (two) times daily. 01/05/22  Yes Davonna Belling, MD  ?empagliflozin (JARDIANCE) 10 MG TABS tablet Take 1 tablet (10 mg total) by mouth daily before breakfast. 07/08/21  Yes Donato Heinz, MD  ?ipratropium (ATROVENT) 0.03 % nasal spray Place 2 sprays into both nostrils every 12 (twelve) hours. 12/30/21  Yes Freddi Starr, MD  ?levocetirizine (XYZAL) 5 MG tablet TAKE 1 TABLET DAILY ?Patient taking differently: Take 10 mg by mouth every evening. 11/03/21  Yes Fargo, Amy E, NP  ?levothyroxine (SYNTHROID) 88 MCG tablet Take 1 tablet (88 mcg total) by mouth daily. 10/02/21  Yes Fargo, Amy E, NP  ?losartan (COZAAR) 50 MG tablet Take 1 tablet (50 mg total) by mouth in the  morning. 07/29/21  Yes Donato Heinz, MD  ?montelukast (SINGULAIR) 10 MG tablet TAKE 1 TABLET DAILY ?Patient taking differently: Take 10 mg by mouth at bedtime. 11/03/21  Yes [name], NP  ?Multiple Vitamin (MULTIVITAMIN PO) Take 1 tablet by mouth daily.   Yes [provider]  ?senna (SENOKOT) 8.6 MG TABS tablet Take 8.6 mg by mouth 2 (two) times daily.   Yes [provider]  ?triamcinolone ointment (KENALOG) 0.1 % Apply 1 application. topically 2 (two) times daily. 12/23/21  Yes [provider]  ?apixaban (ELIQUIS) 2.5 MG TABS tablet Take 1 tablet (2.5 mg total) by mouth 2 (two) times daily. ?Patient not taking: Reported on 01/05/2022 07/04/21   Donato Heinz, MD  ?fluticasone furoate-vilanterol (BREO ELLIPTA) 100-25 MCG/ACT AEPB USE 1 INHALATION ORALLY    DAILY ?Patient not taking: Reported on 01/05/2022 11/03/21   [name], NP  ?furosemide (LASIX) 20 MG tablet Take 20 mg once daily for 5 days and then take as needed for weight gain of 3 lbs in one day or 5 lbs in one week ?Patient not taking: Reported on 01/05/2022 10/16/21   Donato Heinz, MD  ?pantoprazole (PROTONIX) 40 MG tablet TAKE 1 TABLET DAILY ?Patient not taking: Reported on 01/05/2022 11/03/21   [name], NP  ?rosuvastatin (CRESTOR) 20 MG tablet Take 1 tablet (20  mg total) by mouth daily. ?Patient not taking: Reported on 01/05/2022 09/25/21   [name], NP  ?   ? ?Allergies    ?Lisinopril, Telmisartan, Atorvastatin, Sulfa antibiotics, and Sulfamethoxazole-trimethoprim   ? ?Review of Systems   ?Review of Systems ? ?Physical Exam ?Updated Vital Signs ?BP (!) 140/50   Pulse (!) 53   Temp 98.2 ?F (36.8 ?C) (Oral)   Resp 18   Ht '5\' 10"'$  (1.778 m)   Wt 77.7 kg   SpO2 94%   BMI 24.58 kg/m?  ?Physical Exam ?Vitals reviewed.  ?HENT:  ?   Head:  ?   Comments: Hematoma to right forehead with small laceration.  Eye movements intact.  Face symmetric.  No cervical spine tenderness with cervical collar  in place. ?Chest:  ?   Chest wall: No tenderness.  ?Abdominal:  ?   Tenderness: There is no abdominal tenderness.  ?Musculoskeletal:  ?   Comments: Skin tear right forearm.  No underlying bony tenderness.  ?Skin: ?   General: Skin is warm.  ?Neurological:  ?   Mental Status: He is alert.  ?   Comments: Awake and answers questions with some confusion.  Does not know what happened.  ? ? ?ED Results / Procedures / Treatments   ?Labs ?(all labs ordered are listed, but only abnormal results are displayed) ?Labs Reviewed  ?CBC WITH DIFFERENTIAL/PLATELET - Abnormal; Notable for the following components:  ?    Result Value  ? RBC 3.64 (*)   ? Hemoglobin 12.6 (*)   ? HCT 38.9 (*)   ? MCV 106.9 (*)   ? MCH 34.6 (*)   ? All other components within normal limits  ?COMPREHENSIVE METABOLIC PANEL - Abnormal; Notable for the following components:  ? Creatinine, Ser 1.29 (*)   ? Total Protein 6.4 (*)   ? Alkaline Phosphatase 30 (*)   ? GFR, Estimated 53 (*)   ? All other components within normal limits  ?ETHANOL - Abnormal; Notable for the following components:  ? Alcohol, Ethyl (B) 213 (*)   ? All other components within normal limits  ?I-STAT CHEM 8, ED - Abnormal; Notable for the following components:  ? Creatinine, Ser 1.70 (*)   ? Calcium, Ion 1.05 (*)   ? All other components within normal limits  ?RAPID URINE DRUG SCREEN, HOSP PERFORMED  ? ? ?EKG ?EKG Interpretation ? ?Date/Time:  Monday January 05 2022 63:78:58 EDT ?Ventricular Rate:  61 ?PR Interval:  274 ?QRS Duration: 156 ?QT Interval:  477 ?QTC Calculation: 481 ?R Axis:   -71 ?Text Interpretation: Sinus or ectopic atrial rhythm Prolonged PR interval IVCD, consider atypical RBBB No significant change since last tracing Confirmed by Davonna Belling [phone]) on 01/05/2022 7:22:48 AM ? ?Radiology ?CT HEAD WO CONTRAST (5MM) ? ?Result Date: 01/05/2022 ?CLINICAL DATA:  Fall injury on blood thinners. EXAM: CT HEAD WITHOUT CONTRAST CT MAXILLOFACIAL WITHOUT CONTRAST CT CERVICAL SPINE  WITHOUT CONTRAST CT CHEST, ABDOMEN AND PELVIS WITH CONTRAST TECHNIQUE: Contiguous axial images were obtained from the base of the skull through the vertex without intravenous contrast. Multidetector CT imaging of the maxillofacial structures was performed. Multiplanar CT image reconstructions were also generated. A small metallic BB was placed on the right temple in order to reliably differentiate right from left. Multidetector CT imaging of the cervical spine was performed without intravenous contrast. Multiplanar CT image reconstructions were also generated. Multidetector CT imaging of the chest, abdomen and pelvis was performed following the standard protocol during bolus administration  of intravenous contrast. RADIATION DOSE REDUCTION: This exam was performed according to the departmental dose-optimization program which includes automated exposure control, adjustment of the mA and/or kV according to patient size and/or use of iterative reconstruction technique. CONTRAST:  19m OMNIPAQUE IOHEXOL 350 MG/ML SOLN COMPARISON:  Head, cervical spine, and chest, abdomen and pelvis CT all performed 03/20/2021 FINDINGS: CT HEAD FINDINGS Brain: There is mild-to-moderate cerebral atrophy, small vessel disease and atrophic ventriculomegaly, mild cerebellar atrophy. No asymmetry is seen concerning for acute infarct, hemorrhage or mass. There is senescent mineralization in the posterior basal ganglia. There is no midline shift. Vascular: The carotid siphons, distal vertebral arteries are heavily calcified. There are no hyperdense central vessels. Skull: Broad-based right forehead frontal scalp hematoma. The calvarium, skull base and orbits are intact. Other: Trace chronic fluid both mastoid tips. The mastoid air cells are otherwise clear. CT MAXILLOFACIAL FINDINGS Osseous: No fracture or mandibular dislocation. No destructive process. There is carious dentition of the right maxillary teeth warranting follow-up with a dentist.  There are mandibular tori along the inner mandibular surface. Orbits: Small chronic bony defect again noted laterally in the left orbital floor, unchanged. No acute traumatic or inflammatory orbital findings

## 2022-01-05 NOTE — Progress Notes (Signed)
Orthopedic Tech Progress Note ?Patient Details:  ?Ronnie Joseph ?Apr 04, 1932 ?[id number] ? ?Patient ID: Mohanad Carsten, male   DOB: 1932-05-25, 88 y.o.   MRN: [id number] ?I attended trauma page. ?Karolee Stamps ?01/05/2022, 7:12 AM ? ?

## 2022-01-05 NOTE — ED Triage Notes (Signed)
Pt BIB EMS for a fall on eloqius. Last seen at 0400 and found at 066 by nursing staff. Hematoma and laceration to the right forehead, skin tear to the right forearm and a bruise to the right thigh.  ? ?A&O x2 ?134/58 ?58 HR  ?102 CBG ?

## 2022-01-14 ENCOUNTER — Encounter: Payer: Self-pay | Admitting: Orthopedic Surgery

## 2022-01-15 ENCOUNTER — Ambulatory Visit (INDEPENDENT_AMBULATORY_CARE_PROVIDER_SITE_OTHER): Payer: Medicare Other | Admitting: Orthopedic Surgery

## 2022-01-15 ENCOUNTER — Telehealth: Payer: Self-pay | Admitting: Hematology and Oncology

## 2022-01-15 ENCOUNTER — Encounter: Payer: Self-pay | Admitting: Orthopedic Surgery

## 2022-01-15 VITALS — BP 130/68 | HR 65 | Temp 98.0°F | Wt 173.4 lb

## 2022-01-15 DIAGNOSIS — I10 Essential (primary) hypertension: Secondary | ICD-10-CM

## 2022-01-15 DIAGNOSIS — S0181XD Laceration without foreign body of other part of head, subsequent encounter: Secondary | ICD-10-CM | POA: Diagnosis not present

## 2022-01-15 DIAGNOSIS — R911 Solitary pulmonary nodule: Secondary | ICD-10-CM | POA: Diagnosis not present

## 2022-01-15 DIAGNOSIS — I5042 Chronic combined systolic (congestive) and diastolic (congestive) heart failure: Secondary | ICD-10-CM | POA: Diagnosis not present

## 2022-01-15 DIAGNOSIS — R053 Chronic cough: Secondary | ICD-10-CM

## 2022-01-15 DIAGNOSIS — W19XXXD Unspecified fall, subsequent encounter: Secondary | ICD-10-CM

## 2022-01-15 DIAGNOSIS — I700 Atherosclerosis of aorta: Secondary | ICD-10-CM

## 2022-01-15 DIAGNOSIS — I480 Paroxysmal atrial fibrillation: Secondary | ICD-10-CM

## 2022-01-15 NOTE — Patient Instructions (Signed)
Continue to wash laceration with soap and water ? ?May apply antibiotic ointment over laceration 1-2x/daily as needed ?

## 2022-01-15 NOTE — Telephone Encounter (Signed)
Rescheduled appointment per providers template. Left message.  ? ?

## 2022-01-15 NOTE — Progress Notes (Signed)
? ? ?Careteam: ?Patient Care Team: ?[name], NP as PCP - General (Adult Health Nurse Practitioner) ?Vickie Epley, MD as PCP - Electrophysiology (Cardiology) ?Donato Heinz, MD as PCP - Cardiology (Cardiology) ?Jari Pigg, MD as Consulting Physician (Dermatology) ?Donato Heinz, MD as Consulting Physician (Cardiology) ?[name], NP (Adult Health Nurse Practitioner) ? ?Seen by: Windell Moulding, AGNP-C ? ?PLACE OF SERVICE:  ?[facility]  ?Advanced Directive information ?Does Patient Have a Medical Advance Directive?: Yes, Type of Advance Directive: [place], Does patient want to make changes to medical advance directive?: No - Patient declined ? ?Allergies  ?Allergen Reactions  ? Lisinopril Swelling  ? Telmisartan Hives  ? Atorvastatin Rash  ? Sulfa Antibiotics Rash  ? Sulfamethoxazole-Trimethoprim Rash  ? ? ?Chief Complaint  ?Patient presents with  ? Hospitalization Follow-up  ?  Discharged from emergency room.  ? ? ? ?HPI: Patient is a 88 y.o. male seen today for follow up due to recent ED visit.  ? ?Daughter present during encounter.  ? ?03/27 he fell and developed a laceration to forehead. He received 8 sutures in ED. He was reported to be confused during encounter. Confusion improved once daughter was present. Also admitted to alcohol use prior to incident. He had been drinking 2-3 scotch drinks per night. CT head/spine with no acute abnormalities or fracture. CT chest revealed ground- glass opacities in right lower lobe suggesting pneumonia, stable 7 x 3 mm left lower lobe nodule noted. He was discharged with prescription for doxycycline and advised to follow up with PCP. Today he has completed 5 day prescription of doxycycline. He denies pain to head and neck. Wearing left knee brace. Ambulates with Rolator. No additional falls. He has stopped having daily alcoholic drink. Plan to have staff administer spirits from now on.  ? ?Scheduled to see pulmonary 05/30.   ? ?8 sutures removed today.  ? ? ? ?Review of Systems:  ?Review of Systems  ?Constitutional:  Negative for chills, fever, malaise/fatigue and weight loss.  ?HENT:  Negative for congestion.   ?Eyes:  Negative for blurred vision and double vision.  ?Respiratory:  Positive for cough. Negative for shortness of breath and wheezing.   ?Cardiovascular:  Negative for chest pain and leg swelling.  ?Gastrointestinal:  Negative for abdominal pain, blood in stool, constipation, diarrhea, heartburn, nausea and vomiting.  ?Genitourinary:  Negative for dysuria and hematuria.  ?Musculoskeletal:  Positive for falls and joint pain. Negative for back pain and neck pain.  ?Skin:   ?     Forehead laceration  ?Neurological:  Positive for weakness. Negative for dizziness and headaches.  ?Psychiatric/Behavioral:  Negative for depression. The patient is not nervous/anxious and does not have insomnia.   ? ?Past Medical History:  ?Diagnosis Date  ? Acute anemia 09/26/2020  ? Anemia due to acute blood loss 05/25/2019  ? Chronic maxillary sinusitis 08/06/2017  ? Coronary artery disease due to lipid rich plaque 07/27/2019  ? Essential hypertension 02/14/2003  ? Fainting 05/30/2019  ? Fall 05/25/1999  ? High cholesterol 03/14/2001  ? History of basal cell carcinoma 11/02/2019  ? History of pneumonia   ? History of snoring   ? History of upper respiratory infection   ? Irregular heartbeat 08/06/2003  ? Ischemic cardiomyopathy 05/30/2019  ? Mild cognitive impairment 05/15/2019  ? NSTEMI (non-ST elevated myocardial infarction) ([place]) 06/03/2019  ? Poor renal function 03/19/2005  ? Primary osteoarthritis of shoulder 10/24/2015  ? Psoriasis 12/29/2004  ? Rash and  nonspecific skin eruption 04/30/2020  ? Schatzki's ring 08/07/2020  ? Sepsis with acute hypoxic respiratory failure ([place]) 09/26/2020  ? Sleep apnea 08/13/2003  ? ?Past Surgical History:  ?Procedure Laterality Date  ? APPENDECTOMY    ? CATARACT EXTRACTION    ? COLONOSCOPY    ? HERNIA REPAIR     ? RIGHT HEART CATHETERIZATION WITH ADENOSINE STUDY    ? UPPER GI ENDOSCOPY    ? ?Social History: ?  reports that he quit smoking about 63 years ago. His smoking use included cigarettes. He smoked an average of .25 packs per day. He has never used smokeless tobacco. He reports current alcohol use of about 3.0 standard drinks per week. He reports that he does not use drugs. ? ?Family History  ?Problem Relation Age of Onset  ? Lung disease Mother   ? Heart disease Father   ? Heart attack Father   ? Brain cancer Brother   ? Breast cancer Daughter   ? Celiac disease Daughter   ? Diabetes Son   ? Diabetes Mellitus I Son   ? Stomach cancer Other   ? Pancreatic cancer Other   ? Esophageal cancer Other   ? Colon polyps Other   ? Colon cancer Other   ? ? ?Medications: ?Patient's Medications  ?New Prescriptions  ? No medications on file  ?Previous Medications  ? ACETAMINOPHEN (TYLENOL) 500 MG TABLET    Take 1,000 mg by mouth every 8 (eight) hours as needed for mild pain.  ? AMIODARONE (PACERONE) 200 MG TABLET    Take 1 tablet (200 mg total) by mouth daily.  ? APIXABAN (ELIQUIS) 2.5 MG TABS TABLET    Take 1 tablet (2.5 mg total) by mouth 2 (two) times daily.  ? ASPIRIN 81 MG CHEWABLE TABLET    Chew 81 mg by mouth daily.  ? CALCIUM CITRATE PO    Take 200 mg by mouth 2 (two) times daily.  ? DOXYCYCLINE (VIBRAMYCIN) 100 MG CAPSULE    Take 1 capsule (100 mg total) by mouth 2 (two) times daily.  ? EMPAGLIFLOZIN (JARDIANCE) 10 MG TABS TABLET    Take 1 tablet (10 mg total) by mouth daily before breakfast.  ? FLUTICASONE FUROATE-VILANTEROL (BREO ELLIPTA) 100-25 MCG/ACT AEPB    USE 1 INHALATION ORALLY    DAILY  ? FUROSEMIDE (LASIX) 20 MG TABLET    Take 20 mg once daily for 5 days and then take as needed for weight gain of 3 lbs in one day or 5 lbs in one week  ? IPRATROPIUM (ATROVENT) 0.03 % NASAL SPRAY    Place 2 sprays into both nostrils every 12 (twelve) hours.  ? LEVOCETIRIZINE (XYZAL) 5 MG TABLET    TAKE 1 TABLET DAILY  ?  LEVOTHYROXINE (SYNTHROID) 88 MCG TABLET    Take 1 tablet (88 mcg total) by mouth daily.  ? LOSARTAN (COZAAR) 50 MG TABLET    Take 1 tablet (50 mg total) by mouth in the morning.  ? MONTELUKAST (SINGULAIR) 10 MG TABLET    TAKE 1 TABLET DAILY  ? MULTIPLE VITAMIN (MULTIVITAMIN PO)    Take 1 tablet by mouth daily.  ? PANTOPRAZOLE (PROTONIX) 40 MG TABLET    TAKE 1 TABLET DAILY  ? ROSUVASTATIN (CRESTOR) 20 MG TABLET    Take 1 tablet (20 mg total) by mouth daily.  ? SENNA (SENOKOT) 8.6 MG TABS TABLET    Take 8.6 mg by mouth 2 (two) times daily.  ? TRIAMCINOLONE OINTMENT (KENALOG) 0.1 %  Apply 1 application. topically 2 (two) times daily.  ?Modified Medications  ? No medications on file  ?Discontinued Medications  ? No medications on file  ? ? ?Physical Exam: ? ?There were no vitals filed for this visit. ?There is no height or weight on file to calculate BMI. ?Wt Readings from Last 3 Encounters:  ?01/05/22 171 lb 4.8 oz (77.7 kg)  ?12/30/21 171 lb 3.2 oz (77.7 kg)  ?12/23/21 176 lb 9.6 oz (80.1 kg)  ? ? ?Physical Exam ?Vitals reviewed.  ?Constitutional:   ?   General: He is not in acute distress. ?HENT:  ?   Head: Normocephalic.  ?Eyes:  ?   General:     ?   Right eye: No discharge.     ?   Left eye: No discharge.  ?Cardiovascular:  ?   Rate and Rhythm: Normal rate. Rhythm irregular.  ?   Pulses: Normal pulses.  ?   Heart sounds: Murmur heard.  ?Pulmonary:  ?   Effort: Pulmonary effort is normal. No respiratory distress.  ?   Breath sounds: Normal breath sounds. No wheezing or rales.  ?Abdominal:  ?   General: Bowel sounds are normal. There is no distension.  ?   Palpations: Abdomen is soft.  ?   Tenderness: There is no abdominal tenderness.  ?Musculoskeletal:  ?   Cervical back: Neck supple.  ?   Right lower leg: No edema.  ?   Left lower leg: No edema.  ?Skin: ?   General: Skin is warm and dry.  ?   Capillary Refill: Capillary refill takes less than 2 seconds.  ?   Comments: 2 cm laceration over right browline, CDI,  scab intact, 8 sutures removed, no bleeding or drainage, surrounding skin intact.   ?Neurological:  ?   General: No focal deficit present.  ?   Mental Status: He is alert and oriented to person, place, and time.

## 2022-02-03 ENCOUNTER — Encounter: Payer: Self-pay | Admitting: *Deleted

## 2022-02-18 ENCOUNTER — Telehealth: Payer: Self-pay | Admitting: *Deleted

## 2022-02-18 NOTE — Telephone Encounter (Signed)
She will have to wait till tomorrow, if she cannot accept paperwork that was already signed. Thank you for bringing to my attention.

## 2022-02-18 NOTE — Telephone Encounter (Signed)
Ronnie Joseph with  Ronnie Joseph called and stated that she has faxed paperwork that needs to be signed and faxed back. Stated that it has been over a month and they still haven't received it back. Paperwork is in Media done but not stamped faxed. Ronnie Joseph stated to resign the paperwork with new date.  ? ?Included in the fax is: ?-Vitamin B1 Lab results.  ?-Physician's Orders ? ?Placed fax in Ronnie Joseph's  folder to review and sign.  ?To be faxed back to [facility] once completed. Fax: [phone] ?

## 2022-02-26 ENCOUNTER — Other Ambulatory Visit: Payer: Medicare Other

## 2022-02-26 ENCOUNTER — Ambulatory Visit: Payer: Medicare Other | Admitting: Hematology and Oncology

## 2022-02-27 ENCOUNTER — Ambulatory Visit: Payer: Medicare Other | Admitting: Hematology and Oncology

## 2022-02-27 ENCOUNTER — Other Ambulatory Visit: Payer: Medicare Other

## 2022-03-05 ENCOUNTER — Other Ambulatory Visit: Payer: Self-pay | Admitting: Hematology and Oncology

## 2022-03-05 ENCOUNTER — Other Ambulatory Visit: Payer: Self-pay

## 2022-03-05 ENCOUNTER — Inpatient Hospital Stay (HOSPITAL_BASED_OUTPATIENT_CLINIC_OR_DEPARTMENT_OTHER): Payer: Medicare Other | Admitting: Hematology and Oncology

## 2022-03-05 ENCOUNTER — Encounter: Payer: Self-pay | Admitting: Hematology and Oncology

## 2022-03-05 ENCOUNTER — Inpatient Hospital Stay: Payer: Medicare Other | Attending: Hematology and Oncology

## 2022-03-05 VITALS — BP 128/46 | HR 71 | Temp 97.9°F | Resp 16 | Ht 70.0 in | Wt 168.5 lb

## 2022-03-05 DIAGNOSIS — D500 Iron deficiency anemia secondary to blood loss (chronic): Secondary | ICD-10-CM

## 2022-03-05 DIAGNOSIS — D649 Anemia, unspecified: Secondary | ICD-10-CM

## 2022-03-05 DIAGNOSIS — D509 Iron deficiency anemia, unspecified: Secondary | ICD-10-CM | POA: Diagnosis present

## 2022-03-05 LAB — CBC WITH DIFFERENTIAL/PLATELET
Abs Immature Granulocytes: 0.02 10*3/uL (ref 0.00–0.07)
Basophils Absolute: 0.1 10*3/uL (ref 0.0–0.1)
Basophils Relative: 1 %
Eosinophils Absolute: 0.4 10*3/uL (ref 0.0–0.5)
Eosinophils Relative: 5 %
HCT: 39.6 % (ref 39.0–52.0)
Hemoglobin: 13.4 g/dL (ref 13.0–17.0)
Immature Granulocytes: 0 %
Lymphocytes Relative: 19 %
Lymphs Abs: 1.6 10*3/uL (ref 0.7–4.0)
MCH: 34.6 pg — ABNORMAL HIGH (ref 26.0–34.0)
MCHC: 33.8 g/dL (ref 30.0–36.0)
MCV: 102.3 fL — ABNORMAL HIGH (ref 80.0–100.0)
Monocytes Absolute: 1 10*3/uL (ref 0.1–1.0)
Monocytes Relative: 12 %
Neutro Abs: 5 10*3/uL (ref 1.7–7.7)
Neutrophils Relative %: 63 %
Platelets: 315 10*3/uL (ref 150–400)
RBC: 3.87 MIL/uL — ABNORMAL LOW (ref 4.22–5.81)
RDW: 14 % (ref 11.5–15.5)
WBC: 8.1 10*3/uL (ref 4.0–10.5)
nRBC: 0 % (ref 0.0–0.2)

## 2022-03-05 LAB — IRON AND IRON BINDING CAPACITY (CC-WL,HP ONLY)
Iron: 86 ug/dL (ref 45–182)
Saturation Ratios: 27 % (ref 17.9–39.5)
TIBC: 322 ug/dL (ref 250–450)
UIBC: 236 ug/dL (ref 117–376)

## 2022-03-05 LAB — FERRITIN: Ferritin: 37 ng/mL (ref 24–336)

## 2022-03-05 NOTE — Progress Notes (Signed)
[place] [phone]) [phone]   Fax:([phone]) [phone]  PROGRESS NOTE  Patient Care Team: [name], NP as PCP - General (Adult Health Nurse Practitioner) Vickie Epley, MD as PCP - Electrophysiology (Cardiology) Donato Heinz, MD as PCP - Cardiology (Cardiology) Jari Pigg, MD as Consulting Physician (Dermatology) Donato Heinz, MD as Consulting Physician (Cardiology) [name], NP (Adult Health Nurse Practitioner)  CHIEF COMPLAINTS/PURPOSE OF CONSULTATION:  "Iron deficiency anemia "  HISTORY OF PRESENTING ILLNESS:   Ronnie Joseph 88 y.o. male returns for a follow up for iron deficiency anemia. Patient is accompanied by her daughter for this visit.   On exam today, Mr. Ryback reports that his energy levels are fairly stable.  He has been taking his oral ferrous sulfate once a day.  He has not noticed any blood in his stool.   He does notice some black stools which are hard and he takes MiraLAX every other day for constipation. He might have dropped a little bit of weight but this is because he does not like the food in the facility.  He otherwise has been doing well.  No concerns. Rest of the pertinent 10 point ROS reviewed and negative  MEDICAL HISTORY:  Past Medical History:  Diagnosis Date   Acute anemia 09/26/2020   Anemia due to acute blood loss 05/25/2019   Chronic maxillary sinusitis 08/06/2017   Coronary artery disease due to lipid rich plaque 07/27/2019   Essential hypertension 02/14/2003   Fainting 05/30/2019   Fall 05/25/1999   High cholesterol 03/14/2001   History of basal cell carcinoma 11/02/2019   History of pneumonia    History of snoring    History of upper respiratory infection    Irregular heartbeat 08/06/2003   Ischemic cardiomyopathy 05/30/2019   Mild cognitive impairment 05/15/2019   NSTEMI (non-ST elevated myocardial infarction) ([place]) 06/03/2019   Poor renal function 03/19/2005   Primary  osteoarthritis of shoulder 10/24/2015   Psoriasis 12/29/2004   Rash and nonspecific skin eruption 04/30/2020   Schatzki's ring 08/07/2020   Sepsis with acute hypoxic respiratory failure ([place]) 09/26/2020   Sleep apnea 08/13/2003    SURGICAL HISTORY: Past Surgical History:  Procedure Laterality Date   APPENDECTOMY     CATARACT EXTRACTION     COLONOSCOPY     HERNIA REPAIR     RIGHT HEART CATHETERIZATION WITH ADENOSINE STUDY     UPPER GI ENDOSCOPY      SOCIAL HISTORY: Social History   Socioeconomic History   Marital status: Widowed    Spouse name: Not on file   Number of children: Not on file   Years of education: Not on file   Highest education level: Not on file  Occupational History   Not on file  Tobacco Use   Smoking status: Former    Packs/day: 0.25    Types: Cigarettes    Quit date: 12/17/1958    Years since quitting: 63.2   Smokeless tobacco: Never  Vaping Use   Vaping Use: Never used  Substance and Sexual Activity   Alcohol use: Yes    Alcohol/week: 3.0 standard drinks    Types: 3 Standard drinks or equivalent per week   Drug use: Never   Sexual activity: Not Currently  Other Topics Concern   Not on file  Social History Narrative   Tobacco use, amount per day now: 0   Past tobacco use, amount per day: Less than 1 pack   How many years did you use  tobacco: 5 years, stopped in 1960   Alcohol use (drinks per week): 4   Diet: N/A   Do you drink/eat things with caffeine: Yes.   Marital status: Widowed                                  What year were you married? 1960   Do you live in a house, apartment, assisted living, condo, trailer, etc.? Assisted Living.   Is it one or more stories? 1    How many persons live in your home? 1   Do you have pets in your home?( please list) No.   Highest Level of education completed? College   Current or past profession: Pharmacist   Do you exercise?  No.                                Type and how often?   Do you have a  living will? Yes.   Do you have a DNR form?  Yes.                                 If not, do you want to discuss one?   Do you have signed POA/HPOA forms? Yes.                       If so, please bring to you appointment      Do you have any difficulty bathing or dressing yourself? Yes.   Do you have any difficulty preparing food or eating? Yes.   Do you have any difficulty managing your medications? Yes.   Do you have any difficulty managing your finances? No.   Do you have any difficulty affording your medications? No.   Social Determinants of Health   Financial Resource Strain: Not on file  Food Insecurity: Not on file  Transportation Needs: Not on file  Physical Activity: Not on file  Stress: Not on file  Social Connections: Not on file  Intimate Partner Violence: Not on file    FAMILY HISTORY: Family History  Problem Relation Age of Onset   Lung disease Mother    Heart disease Father    Heart attack Father    Brain cancer Brother    Breast cancer Daughter    Celiac disease Daughter    Diabetes Son    Diabetes Mellitus I Son    Stomach cancer Other    Pancreatic cancer Other    Esophageal cancer Other    Colon polyps Other    Colon cancer Other     ALLERGIES:  is allergic to lisinopril, telmisartan, atorvastatin, sulfa antibiotics, and sulfamethoxazole-trimethoprim.  MEDICATIONS:  Current Outpatient Medications  Medication Sig Dispense Refill   acetaminophen (TYLENOL) 500 MG tablet Take 1,000 mg by mouth every 8 (eight) hours as needed for mild pain.     amiodarone (PACERONE) 200 MG tablet Take 1 tablet (200 mg total) by mouth daily. 90 tablet 3   apixaban (ELIQUIS) 2.5 MG TABS tablet Take 1 tablet (2.5 mg total) by mouth 2 (two) times daily. 180 tablet 3   aspirin 81 MG chewable tablet Chew 81 mg by mouth daily.     CALCIUM CITRATE PO Take 200 mg by mouth 2 (two) times daily.     Emollient (  CERAVE) LOTN Apply topically in the morning and at bedtime.      empagliflozin (JARDIANCE) 10 MG TABS tablet Take 1 tablet (10 mg total) by mouth daily before breakfast. 90 tablet 3   ferrous sulfate 325 (65 FE) MG tablet Take 325 mg by mouth daily with breakfast.     Fexofenadine HCl (MUCINEX ALLERGY PO) Take by mouth daily.     fluticasone (FLONASE) 50 MCG/ACT nasal spray Place 2 sprays into both nostrils daily.     fluticasone furoate-vilanterol (BREO ELLIPTA) 100-25 MCG/ACT AEPB USE 1 INHALATION ORALLY    DAILY 180 each 1   furosemide (LASIX) 20 MG tablet Take 20 mg once daily for 5 days and then take as needed for weight gain of 3 lbs in one day or 5 lbs in one week 5 tablet 0   ipratropium (ATROVENT) 0.03 % nasal spray Place 2 sprays into both nostrils every 12 (twelve) hours. 30 mL 12   levocetirizine (XYZAL) 5 MG tablet TAKE 1 TABLET DAILY 90 tablet 1   levothyroxine (SYNTHROID) 88 MCG tablet Take 1 tablet (88 mcg total) by mouth daily. 90 tablet 1   losartan (COZAAR) 50 MG tablet Take 1 tablet (50 mg total) by mouth in the morning. 90 tablet 3   montelukast (SINGULAIR) 10 MG tablet TAKE 1 TABLET DAILY 90 tablet 1   Multiple Vitamin (MULTIVITAMIN PO) Take 1 tablet by mouth daily.     naltrexone (DEPADE) 50 MG tablet Take 50 mg by mouth daily.     pantoprazole (PROTONIX) 40 MG tablet TAKE 1 TABLET DAILY 90 tablet 1   polyethylene glycol (MIRALAX / GLYCOLAX) 17 g packet Take 17 g by mouth daily.     rosuvastatin (CRESTOR) 20 MG tablet Take 1 tablet (20 mg total) by mouth daily. 30 tablet 0   senna (SENOKOT) 8.6 MG TABS tablet Take 8.6 mg by mouth 2 (two) times daily.     triamcinolone ointment (KENALOG) 0.1 % Apply 1 application. topically 2 (two) times daily.     No current facility-administered medications for this visit.    REVIEW OF SYSTEMS:   Constitutional: ( - ) fevers, ( - )  chills , ( - ) night sweats Eyes: ( - ) blurriness of vision, ( - ) double vision, ( - ) watery eyes Ears, nose, mouth, throat, and face: ( - ) mucositis, ( - ) sore  throat Respiratory: ( - ) cough, ( - ) dyspnea, ( - ) wheezes Cardiovascular: ( - ) palpitation, ( - ) chest discomfort, ( - ) lower extremity swelling Gastrointestinal:  ( - ) nausea, ( - ) heartburn, ( - ) change in bowel habits Skin: ( - ) abnormal skin rashes Lymphatics: ( - ) new lymphadenopathy, ( - ) easy bruising Neurological: ( - ) numbness, ( - ) tingling, ( - ) new weaknesses Behavioral/Psych: ( - ) mood change, ( - ) new changes  All other systems were reviewed with the patient and are negative.  PHYSICAL EXAMINATION: ECOG PERFORMANCE STATUS: 1 - Symptomatic but completely ambulatory  Vitals:   03/05/22 1346  BP: (!) 128/46  Pulse: 71  Resp: 16  Temp: 97.9 F (36.6 C)  SpO2: 98%    Filed Weights   03/05/22 1346  Weight: 168 lb 8 oz (76.4 kg)    Physical Exam Constitutional:      Appearance: Normal appearance.  Cardiovascular:     Rate and Rhythm: Normal rate and regular rhythm.  Pulmonary:  Effort: Pulmonary effort is normal.     Breath sounds: Normal breath sounds.  Musculoskeletal:        General: Normal range of motion.     Cervical back: Normal range of motion and neck supple. No rigidity.  Lymphadenopathy:     Cervical: No cervical adenopathy.  Skin:    General: Skin is warm and dry.  Neurological:     General: No focal deficit present.     Mental Status: He is alert.     LABORATORY DATA:  I have reviewed the data as listed    Latest Ref Rng & Units 03/05/2022    1:17 PM 01/05/2022    7:06 AM 01/05/2022    7:04 AM  CBC  WBC 4.0 - 10.5 K/uL 8.1   8.4     Hemoglobin 13.0 - 17.0 g/dL 13.4   12.6   13.3    Hematocrit 39.0 - 52.0 % 39.6   38.9   39.0    Platelets 150 - 400 K/uL 315   275          Latest Ref Rng & Units 01/05/2022    7:06 AM 01/05/2022    7:04 AM 01/01/2022   12:00 AM  CMP  Glucose 70 - 99 mg/dL 81   81     BUN 8 - 23 mg/dL '17   20   23       '$ Creatinine 0.61 - 1.24 mg/dL 1.29   1.70   1.4       Sodium 135 - 145 mmol/L  142   142   141       Potassium 3.5 - 5.1 mmol/L 3.9   3.9   4.9       Chloride 98 - 111 mmol/L 110   108   103       CO2 22 - 32 mmol/L 22    25       Calcium 8.9 - 10.3 mg/dL 9.0    9.7       Total Protein 6.5 - 8.1 g/dL 6.4      Total Bilirubin 0.3 - 1.2 mg/dL 0.3      Alkaline Phos 38 - 126 U/L 30      AST 15 - 41 U/L 24      ALT 0 - 44 U/L 19         This result is from an external source.    ASSESSMENT & PLAN Ronnie Joseph is a 88 y.o. male who returns for follow-up for iron deficiency anemia.  #Iron deficiency anemia 2/2 chronic blood loss:  -- Secondary to intermittent bleeding AVMs.  GI advised against repeat endoscopic procedures given his age. Recommend to proceed with supportive care.  -- Currently on ferrous sulfate 325 mg once daily.  Patient supplements with MiraLAX to minimize constipation. --CBC from today showed a hemoglobin of 13.4 g/dL, mild macrocytosis, no leukopenia or thrombocytopenia.  Iron binding panel unremarkable. --At this time he has been tolerating oral iron once a day well and his anemia has completely resolved, so we will try to continue once a day iron supplementation and return to clinic in 6 months. He and daughter expressed understanding of the recommendations  No orders of the defined types were placed in this encounter.   All questions were answered. The patient knows to call the clinic with any problems, questions or concerns.  I have spent a total of 15 minutes minutes of face-to-face and non-face-to-face time, preparing to  see the patient, performing a medically appropriate examination, counseling and educating the patient, ordering tests, documenting clinical information in the electronic health record, and care coordination.   Benay Pike MD

## 2022-03-07 ENCOUNTER — Other Ambulatory Visit: Payer: Self-pay | Admitting: Orthopedic Surgery

## 2022-03-07 DIAGNOSIS — E039 Hypothyroidism, unspecified: Secondary | ICD-10-CM

## 2022-03-10 ENCOUNTER — Encounter: Payer: Self-pay | Admitting: Pulmonary Disease

## 2022-03-10 ENCOUNTER — Other Ambulatory Visit: Payer: Self-pay | Admitting: Orthopedic Surgery

## 2022-03-10 ENCOUNTER — Ambulatory Visit (INDEPENDENT_AMBULATORY_CARE_PROVIDER_SITE_OTHER): Payer: Medicare Other | Admitting: Pulmonary Disease

## 2022-03-10 ENCOUNTER — Other Ambulatory Visit: Payer: Self-pay | Admitting: Cardiology

## 2022-03-10 VITALS — BP 142/64 | HR 72 | Ht 70.0 in | Wt 179.0 lb

## 2022-03-10 DIAGNOSIS — R053 Chronic cough: Secondary | ICD-10-CM

## 2022-03-10 DIAGNOSIS — J324 Chronic pansinusitis: Secondary | ICD-10-CM | POA: Diagnosis not present

## 2022-03-10 DIAGNOSIS — R0982 Postnasal drip: Secondary | ICD-10-CM

## 2022-03-10 LAB — PULMONARY FUNCTION TEST
DL/VA % pred: 95 %
DL/VA: 3.6 ml/min/mmHg/L
DLCO cor % pred: 78 %
DLCO cor: 18.2 ml/min/mmHg
DLCO unc % pred: 75 %
DLCO unc: 17.56 ml/min/mmHg
FEF 25-75 Post: 2.42 L/sec
FEF 25-75 Pre: 2.79 L/sec
FEF2575-%Change-Post: -13 %
FEF2575-%Pred-Post: 153 %
FEF2575-%Pred-Pre: 177 %
FEV1-%Change-Post: -5 %
FEV1-%Pred-Post: 88 %
FEV1-%Pred-Pre: 93 %
FEV1-Post: 2.23 L
FEV1-Pre: 2.36 L
FEV1FVC-%Change-Post: -4 %
FEV1FVC-%Pred-Pre: 121 %
FEV6-%Change-Post: -1 %
FEV6-%Pred-Post: 80 %
FEV6-%Pred-Pre: 81 %
FEV6-Post: 2.74 L
FEV6-Pre: 2.78 L
FEV6FVC-%Change-Post: 0 %
FEV6FVC-%Pred-Post: 108 %
FEV6FVC-%Pred-Pre: 108 %
FVC-%Change-Post: -1 %
FVC-%Pred-Post: 74 %
FVC-%Pred-Pre: 75 %
FVC-Post: 2.74 L
FVC-Pre: 2.78 L
Post FEV1/FVC ratio: 81 %
Post FEV6/FVC ratio: 100 %
Pre FEV1/FVC ratio: 85 %
Pre FEV6/FVC Ratio: 100 %
RV % pred: 91 %
RV: 2.58 L
TLC % pred: 79 %
TLC: 5.65 L

## 2022-03-10 NOTE — Progress Notes (Signed)
Synopsis: Referred in March 2023 for Cough by Sherrie Mustache, NP  Subjective:   PATIENT ID: Ronnie Joseph GENDER: male DOB: Apr 04, 1932, MRN: [id number]   Dupixent Breo Singulair Fexofenadine pantoprazole  HPI  Chief Complaint  Patient presents with   Follow-up    F/U after PFT. States he still has a productive cough with yellow phlegm. Increased chest tightness.    Ronnie Joseph is an 88 year old male, former smoker with obstructive sleep apnea, hypertension, ischemic cardiomyopathy and chronic sinusitis who returns to pulmonary clinic for chronic cough.   He continues to have cough that is productive with yellow phlegm. He has increased chest tightness. His cough was thought to be mainly from post-nasal drainage and ipratropium nasal spray was added to the fluticasone nasal spray with out much benefit. He is on breo ellipta 1 puff daily. He has been started on dupixent for his eczema per dermatology. He remains on fexofenadine and singulair for allergies. He is on pantoprazole for GERD.     Initial OV 12/30/21 PCP note reviewed from 12/11/21. He has progressive cough over the past 6 months. He is taking Breo for presumed COPD/asthma. Has been taking for over year now. He is coughing up yellowish sputum in the morning which becomes clear throughout the day. He denies wheezing or shortness of breath. He does have sinus congestion with post-nasal drainage. He denies GERD. He has allergies throughout the year.   He is currently living in a retirement home. He smoked for a brief period and quit in 1960. He is a retired Software engineer. He denies any harmful dust or chemical exposures. He enjoyed fishing and photography as hobbies.   Past Medical History:  Diagnosis Date   Acute anemia 09/26/2020   Anemia due to acute blood loss 05/25/2019   Chronic maxillary sinusitis 08/06/2017   Coronary artery disease due to lipid rich plaque 07/27/2019   Essential hypertension 02/14/2003   Fainting  05/30/2019   Fall 05/25/1999   High cholesterol 03/14/2001   History of basal cell carcinoma 11/02/2019   History of pneumonia    History of snoring    History of upper respiratory infection    Irregular heartbeat 08/06/2003   Ischemic cardiomyopathy 05/30/2019   Mild cognitive impairment 05/15/2019   NSTEMI (non-ST elevated myocardial infarction) ([place]) 06/03/2019   Poor renal function 03/19/2005   Primary osteoarthritis of shoulder 10/24/2015   Psoriasis 12/29/2004   Rash and nonspecific skin eruption 04/30/2020   Schatzki's ring 08/07/2020   Sepsis with acute hypoxic respiratory failure ([place]) 09/26/2020   Sleep apnea 08/13/2003     Family History  Problem Relation Age of Onset   Lung disease Mother    Heart disease Father    Heart attack Father    Brain cancer Brother    Breast cancer Daughter    Celiac disease Daughter    Diabetes Son    Diabetes Mellitus I Son    Stomach cancer Other    Pancreatic cancer Other    Esophageal cancer Other    Colon polyps Other    Colon cancer Other      Social History   Socioeconomic History   Marital status: Widowed    Spouse name: Not on file   Number of children: Not on file   Years of education: Not on file   Highest education level: Not on file  Occupational History   Not on file  Tobacco Use   Smoking status: Former    Packs/day: 0.25  Types: Cigarettes    Quit date: 12/17/1958    Years since quitting: 63.2   Smokeless tobacco: Never  Vaping Use   Vaping Use: Never used  Substance and Sexual Activity   Alcohol use: Yes    Alcohol/week: 3.0 standard drinks    Types: 3 Standard drinks or equivalent per week   Drug use: Never   Sexual activity: Not Currently  Other Topics Concern   Not on file  Social History Narrative   Tobacco use, amount per day now: 0   Past tobacco use, amount per day: Less than 1 pack   How many years did you use tobacco: 5 years, stopped in 1960   Alcohol use (drinks per week): 4    Diet: N/A   Do you drink/eat things with caffeine: Yes.   Marital status: Widowed                                  What year were you married? 1960   Do you live in a house, apartment, assisted living, condo, trailer, etc.? Assisted Living.   Is it one or more stories? 1    How many persons live in your home? 1   Do you have pets in your home?( please list) No.   Highest Level of education completed? College   Current or past profession: Pharmacist   Do you exercise?  No.                                Type and how often?   Do you have a living will? Yes.   Do you have a DNR form?  Yes.                                 If not, do you want to discuss one?   Do you have signed POA/HPOA forms? Yes.                       If so, please bring to you appointment      Do you have any difficulty bathing or dressing yourself? Yes.   Do you have any difficulty preparing food or eating? Yes.   Do you have any difficulty managing your medications? Yes.   Do you have any difficulty managing your finances? No.   Do you have any difficulty affording your medications? No.   Social Determinants of Health   Financial Resource Strain: Not on file  Food Insecurity: Not on file  Transportation Needs: Not on file  Physical Activity: Not on file  Stress: Not on file  Social Connections: Not on file  Intimate Partner Violence: Not on file     Allergies  Allergen Reactions   Lisinopril Swelling   Telmisartan Hives   Atorvastatin Rash   Sulfa Antibiotics Rash   Sulfamethoxazole-Trimethoprim Rash     Outpatient Medications Prior to Visit  Medication Sig Dispense Refill   acetaminophen (TYLENOL) 500 MG tablet Take 1,000 mg by mouth every 8 (eight) hours as needed for mild pain.     amiodarone (PACERONE) 200 MG tablet TAKE 1 TABLET DAILY 90 tablet 3   apixaban (ELIQUIS) 2.5 MG TABS tablet Take 1 tablet (2.5 mg total) by mouth 2 (two) times daily. 180 tablet 3   aspirin  81 MG chewable tablet Chew 81 mg  by mouth daily.     CALCIUM CITRATE PO Take 200 mg by mouth 2 (two) times daily.     Dupilumab (DUPIXENT [place]) Inject into the skin every 14 (fourteen) days.     Emollient (CERAVE) LOTN Apply topically in the morning and at bedtime.     ferrous sulfate 325 (65 FE) MG tablet Take 325 mg by mouth daily with breakfast.     Fexofenadine HCl (MUCINEX ALLERGY PO) Take by mouth daily.     fluticasone (FLONASE) 50 MCG/ACT nasal spray Place 2 sprays into both nostrils daily as needed.     furosemide (LASIX) 20 MG tablet Take 20 mg once daily for 5 days and then take as needed for weight gain of 3 lbs in one day or 5 lbs in one week 5 tablet 0   JARDIANCE 10 MG TABS tablet TAKE 1 TABLET DAILY BEFORE BREAKFAST 90 tablet 3   levocetirizine (XYZAL) 5 MG tablet TAKE 1 TABLET DAILY 90 tablet 1   levothyroxine (SYNTHROID) 88 MCG tablet TAKE 1 TABLET DAILY 90 tablet 1   losartan (COZAAR) 50 MG tablet Take 1 tablet (50 mg total) by mouth in the morning. 90 tablet 3   montelukast (SINGULAIR) 10 MG tablet TAKE 1 TABLET DAILY 90 tablet 1   Multiple Vitamin (MULTIVITAMIN PO) Take 1 tablet by mouth daily.     pantoprazole (PROTONIX) 40 MG tablet TAKE 1 TABLET DAILY 90 tablet 1   polyethylene glycol (MIRALAX / GLYCOLAX) 17 g packet Take 17 g by mouth daily.     rosuvastatin (CRESTOR) 20 MG tablet Take 1 tablet (20 mg total) by mouth daily. 30 tablet 0   senna (SENOKOT) 8.6 MG TABS tablet Take 8.6 mg by mouth 2 (two) times daily.     triamcinolone ointment (KENALOG) 0.1 % Apply 1 application. topically 2 (two) times daily.     fluticasone furoate-vilanterol (BREO ELLIPTA) 100-25 MCG/ACT AEPB USE 1 INHALATION ORALLY    DAILY 180 each 1   ipratropium (ATROVENT) 0.03 % nasal spray Place 2 sprays into both nostrils every 12 (twelve) hours. 30 mL 12   amiodarone (PACERONE) 200 MG tablet Take 1 tablet (200 mg total) by mouth daily. 90 tablet 3   empagliflozin (JARDIANCE) 10 MG TABS tablet Take 1 tablet (10 mg total) by mouth  daily before breakfast. 90 tablet 3   naltrexone (DEPADE) 50 MG tablet Take 50 mg by mouth daily.     No facility-administered medications prior to visit.   Review of Systems  Constitutional:  Negative for chills, fever, malaise/fatigue and weight loss.  HENT:  Positive for congestion. Negative for sinus pain and sore throat.   Eyes: Negative.   Respiratory:  Positive for cough. Negative for hemoptysis, sputum production, shortness of breath and wheezing.   Cardiovascular:  Negative for chest pain, palpitations, orthopnea, claudication and leg swelling.  Gastrointestinal:  Negative for abdominal pain, heartburn, nausea and vomiting.  Genitourinary: Negative.   Musculoskeletal:  Negative for joint pain and myalgias.  Skin:  Negative for rash.  Neurological:  Negative for weakness.  Endo/Heme/Allergies: Negative.   Psychiatric/Behavioral: Negative.     Objective:   Vitals:   03/10/22 1513  BP: (!) 142/64  Pulse: 72  SpO2: 99%  Weight: 179 lb (81.2 kg)  Height: '5\' 10"'$  (1.778 m)    Physical Exam Constitutional:      General: He is not in acute distress. HENT:     Head: Normocephalic  and atraumatic.  Eyes:     Extraocular Movements: Extraocular movements intact.     Conjunctiva/sclera: Conjunctivae normal.     Pupils: Pupils are equal, round, and reactive to light.  Cardiovascular:     Rate and Rhythm: Normal rate and regular rhythm.     Pulses: Normal pulses.     Heart sounds: Normal heart sounds. No murmur heard. Abdominal:     General: Bowel sounds are normal.     Palpations: Abdomen is soft.  Musculoskeletal:     Right lower leg: No edema.     Left lower leg: No edema.  Lymphadenopathy:     Cervical: No cervical adenopathy.  Skin:    General: Skin is warm and dry.  Neurological:     General: No focal deficit present.     Mental Status: He is alert.  Psychiatric:        Mood and Affect: Mood normal.        Behavior: Behavior normal.        Thought Content:  Thought content normal.        Judgment: Judgment normal.   CBC    Component Value Date/Time   WBC 8.1 03/05/2022 1317   RBC 3.87 (L) 03/05/2022 1317   HGB 13.4 03/05/2022 1317   HGB 12.0 (L) 10/31/2021 1102   HGB 12.8 (L) 09/19/2021 1158   HCT 39.6 03/05/2022 1317   HCT 38.5 09/19/2021 1158   PLT 315 03/05/2022 1317   PLT 391 10/31/2021 1102   PLT 307 09/19/2021 1158   MCV 102.3 (H) 03/05/2022 1317   MCV 104 (H) 09/19/2021 1158   MCH 34.6 (H) 03/05/2022 1317   MCHC 33.8 03/05/2022 1317   RDW 14.0 03/05/2022 1317   RDW 11.8 09/19/2021 1158   LYMPHSABS 1.6 03/05/2022 1317   MONOABS 1.0 03/05/2022 1317   EOSABS 0.4 03/05/2022 1317   BASOSABS 0.1 03/05/2022 1317      Latest Ref Rng & Units 01/05/2022    7:06 AM 01/05/2022    7:04 AM 01/01/2022   12:00 AM  BMP  Glucose 70 - 99 mg/dL 81   81     BUN 8 - 23 mg/dL '17   20   23       '$ Creatinine 0.61 - 1.24 mg/dL 1.29   1.70   1.4       Sodium 135 - 145 mmol/L 142   142   141       Potassium 3.5 - 5.1 mmol/L 3.9   3.9   4.9       Chloride 98 - 111 mmol/L 110   108   103       CO2 22 - 32 mmol/L 22    25       Calcium 8.9 - 10.3 mg/dL 9.0    9.7          This result is from an external source.   Chest imaging: CT Head and Chest 01/05/22 Sinuses: Complete opacification noted of the bilateral frontal sinus, right ethmoid, right sphenoid and right maxillary sinuses, near-complete opacification of the left ethmoid air cells and moderate circumferential disease in the left maxillary sinus is seen with trace fluid, relatively mild membrane thickening in the left sphenoid air cell.   There is hyperostosis of the sinus walls consistent with chronic disease, relative demineralization of the ethmoid bony septa as before. Nasal septum is slightly left deviated.  Mediastinum/Nodes: No enlarged mediastinal, hilar, or axillary lymph nodes. Thyroid gland,  trachea, and esophagus demonstrate no significant findings.   Lungs/Pleura: No  pleural effusion, thickening or pneumothorax. There are scattered alveolar/ground-glass opacities in the right lower lobe probably due to a small pneumonia. There are additional scattered lower zonal linear scar-like opacities.   There is stable 7 x 3 mm fissural based left lower lobe nodule on 5:97. The lungs are slightly emphysematous with posterior atelectatic changes. Mucoid septations noted right main bronchus but no fluid.  CT Chest 03/20/21 No abnormal findings. LLL nodule likely intrapulmonary lymph node.   PFT:    Latest Ref Rng & Units 03/10/2022    1:57 PM  PFT Results  FVC-Pre L 2.78    FVC-Predicted Pre % 75    FVC-Post L 2.74    FVC-Predicted Post % 74    Pre FEV1/FVC % % 85    Post FEV1/FCV % % 81    FEV1-Pre L 2.36    FEV1-Predicted Pre % 93    FEV1-Post L 2.23    DLCO uncorrected ml/min/mmHg 17.56    DLCO UNC% % 75    DLCO corrected ml/min/mmHg 18.20    DLCO COR %Predicted % 78    DLVA Predicted % 95    TLC L 5.65    TLC % Predicted % 79    RV % Predicted % 91    PFT 2023: Mild restriction and mild diffusion defect  Labs:  Path:  Echo 07/15/21: LV EF 40-45%. Grade I diastolic dysfunction. RV systolic function and size are normal.   Heart Catheterization:  Assessment & Plan:   Chronic cough  Post-nasal drainage  Chronic pansinusitis - Plan: Ambulatory referral to ENT  Discussion: Ronnie Joseph is an 88 year old male, former smoker with obstructive sleep apnea, hypertension, ischemic cardiomyopathy and chronic sinusitis who returns to pulmonary clinic for chronic cough.   His cough appears related to post-nasal drainage and chronic sinusitis based on recent head CT findings. He has not noted benefit with fluticasone nasal spray or ipratropium. He can stop ipratropium nasal spray and use fluticasone as needed. We will refer to ENT for further evaluation and recommendations.  His PFTs show mild restriction and diffusion defects. He has scattered areas  of ground glass opacities on CT Chest scan from 12/2021 which are non-specific. He does not have concerning clinical history for autoimmune condition. We will continue to monitor at this time. He can stop breo ellipta since this has not helped his cough.   Follow up in 6 months.  Freda Jackson, MD [facility] Office: [phone]    Current Outpatient Medications:    acetaminophen (TYLENOL) 500 MG tablet, Take 1,000 mg by mouth every 8 (eight) hours as needed for mild pain., Disp: , Rfl:    amiodarone (PACERONE) 200 MG tablet, TAKE 1 TABLET DAILY, Disp: 90 tablet, Rfl: 3   apixaban (ELIQUIS) 2.5 MG TABS tablet, Take 1 tablet (2.5 mg total) by mouth 2 (two) times daily., Disp: 180 tablet, Rfl: 3   aspirin 81 MG chewable tablet, Chew 81 mg by mouth daily., Disp: , Rfl:    CALCIUM CITRATE PO, Take 200 mg by mouth 2 (two) times daily., Disp: , Rfl:    Dupilumab (DUPIXENT [place]), Inject into the skin every 14 (fourteen) days., Disp: , Rfl:    Emollient (CERAVE) LOTN, Apply topically in the morning and at bedtime., Disp: , Rfl:    ferrous sulfate 325 (65 FE) MG tablet, Take 325 mg by mouth daily with breakfast., Disp: , Rfl:  Fexofenadine HCl (MUCINEX ALLERGY PO), Take by mouth daily., Disp: , Rfl:    fluticasone (FLONASE) 50 MCG/ACT nasal spray, Place 2 sprays into both nostrils daily as needed., Disp: , Rfl:    furosemide (LASIX) 20 MG tablet, Take 20 mg once daily for 5 days and then take as needed for weight gain of 3 lbs in one day or 5 lbs in one week, Disp: 5 tablet, Rfl: 0   JARDIANCE 10 MG TABS tablet, TAKE 1 TABLET DAILY BEFORE BREAKFAST, Disp: 90 tablet, Rfl: 3   levocetirizine (XYZAL) 5 MG tablet, TAKE 1 TABLET DAILY, Disp: 90 tablet, Rfl: 1   levothyroxine (SYNTHROID) 88 MCG tablet, TAKE 1 TABLET DAILY, Disp: 90 tablet, Rfl: 1   losartan (COZAAR) 50 MG tablet, Take 1 tablet (50 mg total) by mouth in the morning., Disp: 90 tablet, Rfl: 3   montelukast (SINGULAIR)  10 MG tablet, TAKE 1 TABLET DAILY, Disp: 90 tablet, Rfl: 1   Multiple Vitamin (MULTIVITAMIN PO), Take 1 tablet by mouth daily., Disp: , Rfl:    pantoprazole (PROTONIX) 40 MG tablet, TAKE 1 TABLET DAILY, Disp: 90 tablet, Rfl: 1   polyethylene glycol (MIRALAX / GLYCOLAX) 17 g packet, Take 17 g by mouth daily., Disp: , Rfl:    rosuvastatin (CRESTOR) 20 MG tablet, Take 1 tablet (20 mg total) by mouth daily., Disp: 30 tablet, Rfl: 0   senna (SENOKOT) 8.6 MG TABS tablet, Take 8.6 mg by mouth 2 (two) times daily., Disp: , Rfl:    triamcinolone ointment (KENALOG) 0.1 %, Apply 1 application. topically 2 (two) times daily., Disp: , Rfl:

## 2022-03-10 NOTE — Patient Instructions (Addendum)
Stop Breo Ellipta daily  Stop ipratropium nasal spray daily  Fluticasone nasal spray can be used as needed instead of scheduled daily  Recommend checking the need for Fexofenadine and Xyzal   We will refer you to ENT for further evaluation of your sinuses.   Follow up in 6 months

## 2022-03-10 NOTE — Progress Notes (Signed)
Full PFT Performed Today  

## 2022-03-10 NOTE — Patient Instructions (Signed)
Full PFT Performed Today  

## 2022-03-11 ENCOUNTER — Telehealth: Payer: Self-pay

## 2022-03-11 NOTE — Telephone Encounter (Addendum)
Faxed paperwork to [employer].   Fax number: [phone].   Sent to additional fax number: [phone]

## 2022-03-11 NOTE — Telephone Encounter (Signed)
Error. Please disregard

## 2022-03-13 ENCOUNTER — Other Ambulatory Visit: Payer: Self-pay | Admitting: Orthopedic Surgery

## 2022-03-15 ENCOUNTER — Encounter: Payer: Self-pay | Admitting: Pulmonary Disease

## 2022-03-17 ENCOUNTER — Other Ambulatory Visit: Payer: Self-pay

## 2022-03-17 ENCOUNTER — Emergency Department (HOSPITAL_COMMUNITY): Payer: Medicare Other

## 2022-03-17 ENCOUNTER — Emergency Department (HOSPITAL_COMMUNITY)
Admission: EM | Admit: 2022-03-17 | Discharge: 2022-03-17 | Disposition: A | Discharge status: Home / Self Care | Payer: Medicare Other | Attending: Emergency Medicine | Admitting: Emergency Medicine

## 2022-03-17 DIAGNOSIS — R509 Fever, unspecified: Secondary | ICD-10-CM | POA: Diagnosis present

## 2022-03-17 DIAGNOSIS — Z7901 Long term (current) use of anticoagulants: Secondary | ICD-10-CM | POA: Insufficient documentation

## 2022-03-17 DIAGNOSIS — Z7982 Long term (current) use of aspirin: Secondary | ICD-10-CM | POA: Insufficient documentation

## 2022-03-17 DIAGNOSIS — I10 Essential (primary) hypertension: Secondary | ICD-10-CM | POA: Insufficient documentation

## 2022-03-17 DIAGNOSIS — I2510 Atherosclerotic heart disease of native coronary artery without angina pectoris: Secondary | ICD-10-CM | POA: Insufficient documentation

## 2022-03-17 DIAGNOSIS — Z20822 Contact with and (suspected) exposure to covid-19: Secondary | ICD-10-CM | POA: Diagnosis not present

## 2022-03-17 DIAGNOSIS — J189 Pneumonia, unspecified organism: Secondary | ICD-10-CM | POA: Insufficient documentation

## 2022-03-17 DIAGNOSIS — Z79899 Other long term (current) drug therapy: Secondary | ICD-10-CM | POA: Diagnosis not present

## 2022-03-17 LAB — CBC WITH DIFFERENTIAL/PLATELET
Abs Immature Granulocytes: 0.05 10*3/uL (ref 0.00–0.07)
Basophils Absolute: 0 10*3/uL (ref 0.0–0.1)
Basophils Relative: 0 %
Eosinophils Absolute: 0.4 10*3/uL (ref 0.0–0.5)
Eosinophils Relative: 4 %
HCT: 33.5 % — ABNORMAL LOW (ref 39.0–52.0)
Hemoglobin: 11.4 g/dL — ABNORMAL LOW (ref 13.0–17.0)
Immature Granulocytes: 1 %
Lymphocytes Relative: 13 %
Lymphs Abs: 1.3 10*3/uL (ref 0.7–4.0)
MCH: 35.2 pg — ABNORMAL HIGH (ref 26.0–34.0)
MCHC: 34 g/dL (ref 30.0–36.0)
MCV: 103.4 fL — ABNORMAL HIGH (ref 80.0–100.0)
Monocytes Absolute: 1 10*3/uL (ref 0.1–1.0)
Monocytes Relative: 10 %
Neutro Abs: 7.5 10*3/uL (ref 1.7–7.7)
Neutrophils Relative %: 72 %
Platelets: 297 10*3/uL (ref 150–400)
RBC: 3.24 MIL/uL — ABNORMAL LOW (ref 4.22–5.81)
RDW: 13.4 % (ref 11.5–15.5)
WBC: 10.3 10*3/uL (ref 4.0–10.5)
nRBC: 0 % (ref 0.0–0.2)

## 2022-03-17 LAB — COMPREHENSIVE METABOLIC PANEL
ALT: 74 U/L — ABNORMAL HIGH (ref 0–44)
AST: 78 U/L — ABNORMAL HIGH (ref 15–41)
Albumin: 2.8 g/dL — ABNORMAL LOW (ref 3.5–5.0)
Alkaline Phosphatase: 44 U/L (ref 38–126)
Anion gap: 8 (ref 5–15)
BUN: 26 mg/dL — ABNORMAL HIGH (ref 8–23)
CO2: 24 mmol/L (ref 22–32)
Calcium: 8.5 mg/dL — ABNORMAL LOW (ref 8.9–10.3)
Chloride: 108 mmol/L (ref 98–111)
Creatinine, Ser: 1.43 mg/dL — ABNORMAL HIGH (ref 0.61–1.24)
GFR, Estimated: 47 mL/min — ABNORMAL LOW (ref >60)
Glucose, Bld: 106 mg/dL — ABNORMAL HIGH (ref 70–99)
Potassium: 3.4 mmol/L — ABNORMAL LOW (ref 3.5–5.1)
Sodium: 140 mmol/L (ref 135–145)
Total Bilirubin: 0.7 mg/dL (ref 0.3–1.2)
Total Protein: 6.1 g/dL — ABNORMAL LOW (ref 6.5–8.1)

## 2022-03-17 LAB — URINALYSIS, ROUTINE W REFLEX MICROSCOPIC
Bilirubin Urine: NEGATIVE
Glucose, UA: >=500 mg/dL — AB
Hgb urine dipstick: NEGATIVE
Ketones, ur: NEGATIVE mg/dL
Leukocytes,Ua: NEGATIVE
Nitrite: NEGATIVE
Protein, ur: 30 mg/dL — AB
Specific Gravity, Urine: 1.018 (ref 1.005–1.030)
pH: 5 (ref 5.0–8.0)

## 2022-03-17 LAB — RESP PANEL BY RT-PCR (FLU A&B, COVID) ARPGX2
Influenza A by PCR: NEGATIVE
Influenza B by PCR: NEGATIVE
SARS Coronavirus 2 by RT PCR: NEGATIVE

## 2022-03-17 LAB — LACTIC ACID, PLASMA: Lactic Acid, Venous: 1.1 mmol/L (ref 0.5–1.9)

## 2022-03-17 MED ORDER — SODIUM CHLORIDE 0.9 % IV SOLN
1.0000 g | Freq: Once | INTRAVENOUS | Status: AC
  Administered 2022-03-17: 1 g via INTRAVENOUS
  Filled 2022-03-17: qty 10

## 2022-03-17 MED ORDER — AMOXICILLIN-POT CLAVULANATE 875-125 MG PO TABS: 1.0000 | ORAL_TABLET | Freq: Two times a day (BID) | ORAL | 0 refills | Status: DC

## 2022-03-17 MED ORDER — SODIUM CHLORIDE 0.9 % IV SOLN
500.0000 mg | Freq: Once | INTRAVENOUS | Status: AC
  Administered 2022-03-17: 500 mg via INTRAVENOUS
  Filled 2022-03-17: qty 5

## 2022-03-17 MED ORDER — SODIUM CHLORIDE 0.9 % IV BOLUS
500.0000 mL | Freq: Once | INTRAVENOUS | Status: AC
  Administered 2022-03-17: 500 mL via INTRAVENOUS

## 2022-03-17 MED ORDER — AZITHROMYCIN 250 MG PO TABS
ORAL_TABLET | ORAL | 0 refills | Status: DC
Start: 2022-03-17 — End: 2022-03-24

## 2022-03-17 NOTE — ED Notes (Signed)
Pt. Transported from [employer].  Pt unable to urinate at this time.

## 2022-03-17 NOTE — Discharge Instructions (Addendum)
You came to the emergency department today to be evaluated for your fever and chills.  Your chest x-ray showed that you have pneumonia in your right lung.  Due to this she was started on the antibiotic Augmentin and azithromycin.  Please take them as prescribed.  Please follow-up with your primary care doctor in the outpatient setting for repeat evaluation.  You may have diarrhea from the antibiotics.  It is very important that you continue to take the antibiotics even if you get diarrhea unless a medical professional tells you that you may stop taking them.  If you stop too early the bacteria you are being treated for will become stronger and you may need different, more powerful antibiotics that have more side effects and worsening diarrhea.  Please stay well hydrated and consider probiotics as they may decrease the severity of your diarrhea.   Get help right away if: Your shortness of breath becomes worse. Your chest pain increases. Your sickness becomes worse, especially if you are an older adult or have a weak immune system. You cough up blood.

## 2022-03-17 NOTE — ED Provider Triage Note (Signed)
Emergency Medicine Provider Triage Evaluation Note  Ronnie Joseph , a 88 y.o. male  was evaluated in triage.  Pt complains of fever and chills.  Patient began having fever and chills yesterday.  This morning awoke with chills and sweating through his close.  Assisted living called 911 to bring him to the emergency department for further evaluation.  No known sick contacts.  Review of Systems  Positive: Fever, chills Negative: Rhinorrhea, nasal congestion, cough, shortness of breath, chest pain, abdominal pain, nausea, vomiting, diarrhea, dysuria, hematuria, urinary urgency, urinary frequency  Physical Exam  BP 137/87 (BP Location: Right Arm)   Pulse 73   Temp 98.1 F (36.7 C) (Oral)   Resp 17   Ht '5\' 9"'$  (1.753 m)   Wt 77.1 kg   SpO2 94%   BMI 25.10 kg/m  Gen:   Awake, no distress   Resp:  Normal effort, clear to auscultation bilaterally MSK:   Moves extremities without difficulty  Other:  Abdomen soft, nondistended, nontender no guarding or rebound tenderness.  Medical Decision Making  Medically screening exam initiated at 8:52 AM.  Appropriate orders placed.  Ronnie Joseph was informed that the remainder of the evaluation will be completed by another provider, this initial triage assessment does not replace that evaluation, and the importance of remaining in the ED until their evaluation is complete.     Loni Beckwith, [place] 03/17/22 [phone]

## 2022-03-17 NOTE — ED Triage Notes (Signed)
EMS stated, had chills and fever x 2 days denies any other symptoms. Urine had a bad odor. Low oxygen at 91% but that is normal . Tylenol 500 mg 2 hours ago.

## 2022-03-17 NOTE — ED Notes (Signed)
Ambulated in hall way with walker patient sats stayed 92-96% RA

## 2022-03-17 NOTE — ED Provider Notes (Signed)
[facility] EMERGENCY DEPARTMENT Provider Note   CSN: [id number] Arrival date & time: 03/17/22  2992     History  Chief Complaint  Patient presents with   Fever   Chills    Ronnie Joseph is a 88 y.o. male with a past medical history of chronic maxillary sinusitis, ischemic cardiomyopathy, NSTEMI, CAD, hypertension, high cholesterol, status post appendectomy.  Presents emerged department complaint of fever and chills.  Patient comes from assisted living facility.  Patient has been having fever and chills over the last 2 days.  Tmax of 101 F orally in the last 24 hours.  Patient did receive Tylenol 2 hours prior to arriving in the emergency department.  Patient denies any known sick contacts, rhinorrhea, nasal congestion, cough, shortness of breath, chest pain, abdominal pain, nausea, vomiting, diarrhea, dysuria, hematuria, urinary urgency, urinary frequency.  Endorses alcohol use however states he has not had anything to drink in 3 to 4 months.   Fever Associated symptoms: chills   Associated symptoms: no chest pain, no confusion, no congestion, no cough, no diarrhea, no dysuria, no headaches, no nausea, no rash, no rhinorrhea and no vomiting       Home Medications Prior to Admission medications   Medication Sig Start Date End Date Taking? Authorizing Provider  acetaminophen (TYLENOL) 500 MG tablet Take 1,000 mg by mouth every 8 (eight) hours as needed for mild pain.    [provider]  amiodarone (PACERONE) 200 MG tablet TAKE 1 TABLET DAILY 03/10/22   Donato Heinz, MD  apixaban (ELIQUIS) 2.5 MG TABS tablet Take 1 tablet (2.5 mg total) by mouth 2 (two) times daily. 07/04/21   Donato Heinz, MD  aspirin 81 MG chewable tablet Chew 81 mg by mouth daily.    [provider]  CALCIUM CITRATE PO Take 200 mg by mouth 2 (two) times daily.    [provider]  Dupilumab (DUPIXENT [place]) Inject into the skin every 14 (fourteen) days.     [provider]  Emollient (CERAVE) LOTN Apply topically in the morning and at bedtime.    [provider]  ferrous sulfate 325 (65 FE) MG tablet Take 325 mg by mouth daily with breakfast.    [provider]  Fexofenadine HCl (MUCINEX ALLERGY PO) Take by mouth daily.    [provider]  fluticasone (FLONASE) 50 MCG/ACT nasal spray Place 2 sprays into both nostrils daily as needed.    [provider]  furosemide (LASIX) 20 MG tablet Take 20 mg once daily for 5 days and then take as needed for weight gain of 3 lbs in one day or 5 lbs in one week 10/16/21   Donato Heinz, MD  JARDIANCE 10 MG TABS tablet TAKE 1 TABLET DAILY BEFORE BREAKFAST 03/10/22   Donato Heinz, MD  levocetirizine (XYZAL) 5 MG tablet TAKE 1 TABLET DAILY 03/16/22   Fargo, Amy E, NP  levothyroxine (SYNTHROID) 88 MCG tablet TAKE 1 TABLET DAILY 03/10/22   Fargo, Amy E, NP  losartan (COZAAR) 50 MG tablet Take 1 tablet (50 mg total) by mouth in the morning. 07/29/21   Donato Heinz, MD  montelukast (SINGULAIR) 10 MG tablet TAKE 1 TABLET DAILY 11/03/21   Fargo, Amy E, NP  Multiple Vitamin (MULTIVITAMIN PO) Take 1 tablet by mouth daily.    [provider]  pantoprazole (PROTONIX) 40 MG tablet TAKE 1 TABLET DAILY 11/03/21   Fargo, Amy E, NP  polyethylene glycol (MIRALAX / GLYCOLAX)  17 g packet Take 17 g by mouth daily.    [provider]  rosuvastatin (CRESTOR) 20 MG tablet Take 1 tablet (20 mg total) by mouth daily. 09/25/21   Fargo, Amy E, NP  senna (SENOKOT) 8.6 MG TABS tablet Take 8.6 mg by mouth 2 (two) times daily.    [provider]  triamcinolone ointment (KENALOG) 0.1 % Apply 1 application. topically 2 (two) times daily. 12/23/21   [provider]      Allergies    Lisinopril, Telmisartan, Atorvastatin, Sulfa antibiotics, and Sulfamethoxazole-trimethoprim    Review of Systems   Review of Systems  Constitutional:  Positive  for chills and fever.  HENT:  Negative for congestion and rhinorrhea.   Eyes:  Negative for visual disturbance.  Respiratory:  Negative for cough and shortness of breath.   Cardiovascular:  Negative for chest pain.  Gastrointestinal:  Negative for abdominal pain, diarrhea, nausea and vomiting.  Genitourinary:  Negative for difficulty urinating, dysuria, flank pain, frequency, hematuria, penile discharge, penile pain, penile swelling, scrotal swelling and testicular pain.  Musculoskeletal:  Negative for back pain and neck pain.  Skin:  Negative for color change and rash.  Neurological:  Negative for dizziness, syncope, light-headedness and headaches.  Psychiatric/Behavioral:  Negative for confusion.    Physical Exam Updated Vital Signs BP 124/71   Pulse 69   Temp (!) 97.5 F (36.4 C) (Oral)   Resp (!) 27   Ht '5\' 9"'$  (1.753 m)   Wt 77.1 kg   SpO2 94%   BMI 25.10 kg/m  Physical Exam Vitals and nursing note reviewed.  Constitutional:      General: He is not in acute distress.    Appearance: He is ill-appearing. He is not toxic-appearing or diaphoretic.  HENT:     Head: Normocephalic.  Eyes:     General: No scleral icterus.       Right eye: No discharge.        Left eye: No discharge.  Cardiovascular:     Rate and Rhythm: Normal rate.  Pulmonary:     Effort: Pulmonary effort is normal. No tachypnea, bradypnea or respiratory distress.     Breath sounds: Normal breath sounds. No stridor.     Comments: Speaks in full complete sentences without difficulty Abdominal:     General: Abdomen is flat. There is no distension. There are no signs of injury.     Palpations: Abdomen is soft. There is no mass or pulsatile mass.     Tenderness: There is no abdominal tenderness. There is no right CVA tenderness, left CVA tenderness, guarding or rebound. Negative signs include Murphy's sign.     Hernia: There is no hernia in the umbilical area or ventral area.  Musculoskeletal:     Comments: No  swelling or tenderness to bilateral lower extremities.  No wounds, rash, or erythema noted to bilateral upper or lower extremities  Skin:    General: Skin is warm and dry.  Neurological:     General: No focal deficit present.     Mental Status: He is alert.  Psychiatric:        Behavior: Behavior is cooperative.    ED Results / Procedures / Treatments   Labs (all labs ordered are listed, but only abnormal results are displayed) Labs Reviewed  URINALYSIS, ROUTINE W REFLEX MICROSCOPIC - Abnormal; Notable for the following components:      Result Value   Glucose, UA >=500 (*)    Protein, ur 30 (*)  Bacteria, UA RARE (*)    All other components within normal limits  COMPREHENSIVE METABOLIC PANEL - Abnormal; Notable for the following components:   Potassium 3.4 (*)    Glucose, Bld 106 (*)    BUN 26 (*)    Creatinine, Ser 1.43 (*)    Calcium 8.5 (*)    Total Protein 6.1 (*)    Albumin 2.8 (*)    AST 78 (*)    ALT 74 (*)    GFR, Estimated 47 (*)    All other components within normal limits  CBC WITH DIFFERENTIAL/PLATELET - Abnormal; Notable for the following components:   RBC 3.24 (*)    Hemoglobin 11.4 (*)    HCT 33.5 (*)    MCV 103.4 (*)    MCH 35.2 (*)    All other components within normal limits  URINE CULTURE  RESP PANEL BY RT-PCR (FLU A&B, COVID) ARPGX2  LACTIC ACID, PLASMA  LACTIC ACID, PLASMA    EKG None  Radiology DG Chest 2 View  Result Date: 03/17/2022 CLINICAL DATA:  Fever EXAM: CHEST - 2 VIEW COMPARISON:  01/05/2022 FINDINGS: Stable mild cardiomegaly. Aortic atherosclerosis. Hazy right infrahilar opacity. No pleural effusion or pneumothorax. 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 IMPRESSION: Hazy right infrahilar opacity could reflect atelectasis or pneumonia. Electronically Signed   By: Davina Poke D.O.   On: 03/17/2022 09:26    Procedures Procedures    Medications Ordered in ED Medications   sodium chloride 0.9 % bolus 500 mL (has no administration in time range)    ED Course/ Medical Decision Making/ A&P                           Medical Decision Making Amount and/or Complexity of Data Reviewed Labs: ordered. Radiology: ordered.  Risk Prescription drug management.   Alert 88 year old male in no acute distress, nontoxic-appearing.  Patient does appear ill.  Presents to the emergency department with a chief complaint of fever and chills.  Information obtained from patient and patient's daughter at bedside.  I have reviewed patient's past medical records including previous further notes, labs, and imaging.  Patient has medical history as outlined in HPI which complicates his care.  Due to reports of fever and chills concern for possible infection, will look for source of infection with COVID-19/flu testing, urinalysis, chest x-ray.  Additionally will check basic lab work and lactic acid.  I personally viewed and interpreted patient's chest x-ray which shows hazy right infrahilar opacity which could reflect atelectasis or pneumonia.  I personally viewed interpret patient's lab results.  Pertinent findings include -AKI with BUN 26 and creatinine 1.43 -Transaminitis with AST of 70 and ALT 74 -No leukocytosis -Anemia with hemoglobin 11.4 -Lactic acid within normal limits  Due to concern for pneumonia chest x-ray we will start patient on ceftriaxone and azithromycin.  Curb 65 score of 2 putting patient in moderate risk category.  Patient able to stand and ambulate with oxygen saturation 92 to 95% on room air.  Patient received IV ceftriaxone and azithromycin.  Shared decision making with patient about admission versus discharge with close PCP follow-up.  Patient and patient's daughter are agreeable with discharge at this time.  Will discharge patient on course of Augmentin and azithromycin.  Patient to follow-up with PCP.  Patient was discussed with and evaluated by Dr. Jeanell Sparrow.           Final Clinical  Impression(s) / ED Diagnoses Final diagnoses:  Community acquired pneumonia of right lung, unspecified part of lung    Rx / DC Orders ED Discharge Orders          Ordered    amoxicillin-clavulanate (AUGMENTIN) 875-125 MG tablet  Every 12 hours        03/17/22 1243    azithromycin (ZITHROMAX Z-PAK) 250 MG tablet        03/17/22 [address] PA-C 03/17/22 1402    Pattricia Boss, MD 03/18/22 [phone]

## 2022-03-18 ENCOUNTER — Telehealth: Payer: Self-pay | Admitting: *Deleted

## 2022-03-18 LAB — URINE CULTURE: Culture: <10000 — AB

## 2022-03-18 NOTE — Telephone Encounter (Signed)
From: Leward Quan [email or web address]> Sent: Wednesday, March 18, 2022 2:16:24 PM To: Nuala Alpha [email or web address]> Subject: Call Back    Patsi Sears Director at [facility] is asking for a call back from Glass blower/designer when you have some time please. She stated that she have called and spoken to the clinical person but still had not gotten the response she needed. Please call her at Ph# [phone]      I have called and she stated that they have not received orders back from 02/19/2022 for patient's medications. I see that it was signed, so I faxed to them. Confirmed fax #: [phone]

## 2022-03-22 ENCOUNTER — Other Ambulatory Visit: Payer: Self-pay

## 2022-03-22 ENCOUNTER — Emergency Department (HOSPITAL_COMMUNITY): Payer: Medicare Other

## 2022-03-22 ENCOUNTER — Encounter (HOSPITAL_COMMUNITY): Payer: Self-pay

## 2022-03-22 ENCOUNTER — Inpatient Hospital Stay (HOSPITAL_COMMUNITY)
Admission: EM | Admit: 2022-03-22 | Discharge: 2022-04-11 | DRG: 871 | Disposition: E | Payer: Medicare Other | Attending: Internal Medicine | Admitting: Internal Medicine

## 2022-03-22 ENCOUNTER — Encounter: Payer: Self-pay | Admitting: Pulmonary Disease

## 2022-03-22 DIAGNOSIS — Z833 Family history of diabetes mellitus: Secondary | ICD-10-CM

## 2022-03-22 DIAGNOSIS — Z881 Allergy status to other antibiotic agents status: Secondary | ICD-10-CM

## 2022-03-22 DIAGNOSIS — Z808 Family history of malignant neoplasm of other organs or systems: Secondary | ICD-10-CM

## 2022-03-22 DIAGNOSIS — J9601 Acute respiratory failure with hypoxia: Secondary | ICD-10-CM | POA: Diagnosis present

## 2022-03-22 DIAGNOSIS — D500 Iron deficiency anemia secondary to blood loss (chronic): Secondary | ICD-10-CM | POA: Diagnosis present

## 2022-03-22 DIAGNOSIS — Z8379 Family history of other diseases of the digestive system: Secondary | ICD-10-CM

## 2022-03-22 DIAGNOSIS — Z803 Family history of malignant neoplasm of breast: Secondary | ICD-10-CM

## 2022-03-22 DIAGNOSIS — E876 Hypokalemia: Secondary | ICD-10-CM | POA: Diagnosis present

## 2022-03-22 DIAGNOSIS — E7800 Pure hypercholesterolemia, unspecified: Secondary | ICD-10-CM | POA: Diagnosis present

## 2022-03-22 DIAGNOSIS — Z8249 Family history of ischemic heart disease and other diseases of the circulatory system: Secondary | ICD-10-CM

## 2022-03-22 DIAGNOSIS — E039 Hypothyroidism, unspecified: Secondary | ICD-10-CM | POA: Diagnosis present

## 2022-03-22 DIAGNOSIS — I472 Ventricular tachycardia, unspecified: Secondary | ICD-10-CM | POA: Diagnosis not present

## 2022-03-22 DIAGNOSIS — Z87891 Personal history of nicotine dependence: Secondary | ICD-10-CM

## 2022-03-22 DIAGNOSIS — I340 Nonrheumatic mitral (valve) insufficiency: Secondary | ICD-10-CM | POA: Diagnosis present

## 2022-03-22 DIAGNOSIS — Z888 Allergy status to other drugs, medicaments and biological substances status: Secondary | ICD-10-CM

## 2022-03-22 DIAGNOSIS — R031 Nonspecific low blood-pressure reading: Secondary | ICD-10-CM | POA: Diagnosis not present

## 2022-03-22 DIAGNOSIS — R6520 Severe sepsis without septic shock: Secondary | ICD-10-CM | POA: Diagnosis present

## 2022-03-22 DIAGNOSIS — G4733 Obstructive sleep apnea (adult) (pediatric): Secondary | ICD-10-CM | POA: Diagnosis present

## 2022-03-22 DIAGNOSIS — Z79890 Hormone replacement therapy (postmenopausal): Secondary | ICD-10-CM

## 2022-03-22 DIAGNOSIS — R9431 Abnormal electrocardiogram [ECG] [EKG]: Secondary | ICD-10-CM | POA: Diagnosis present

## 2022-03-22 DIAGNOSIS — Z85828 Personal history of other malignant neoplasm of skin: Secondary | ICD-10-CM

## 2022-03-22 DIAGNOSIS — I480 Paroxysmal atrial fibrillation: Secondary | ICD-10-CM | POA: Diagnosis present

## 2022-03-22 DIAGNOSIS — Z955 Presence of coronary angioplasty implant and graft: Secondary | ICD-10-CM

## 2022-03-22 DIAGNOSIS — E875 Hyperkalemia: Secondary | ICD-10-CM | POA: Diagnosis not present

## 2022-03-22 DIAGNOSIS — A419 Sepsis, unspecified organism: Secondary | ICD-10-CM | POA: Diagnosis not present

## 2022-03-22 DIAGNOSIS — I440 Atrioventricular block, first degree: Secondary | ICD-10-CM | POA: Diagnosis present

## 2022-03-22 DIAGNOSIS — I130 Hypertensive heart and chronic kidney disease with heart failure and stage 1 through stage 4 chronic kidney disease, or unspecified chronic kidney disease: Secondary | ICD-10-CM | POA: Diagnosis present

## 2022-03-22 DIAGNOSIS — I452 Bifascicular block: Secondary | ICD-10-CM | POA: Diagnosis present

## 2022-03-22 DIAGNOSIS — I2510 Atherosclerotic heart disease of native coronary artery without angina pectoris: Secondary | ICD-10-CM | POA: Diagnosis present

## 2022-03-22 DIAGNOSIS — Z79899 Other long term (current) drug therapy: Secondary | ICD-10-CM

## 2022-03-22 DIAGNOSIS — J189 Pneumonia, unspecified organism: Secondary | ICD-10-CM | POA: Diagnosis not present

## 2022-03-22 DIAGNOSIS — N179 Acute kidney failure, unspecified: Secondary | ICD-10-CM | POA: Diagnosis not present

## 2022-03-22 DIAGNOSIS — I252 Old myocardial infarction: Secondary | ICD-10-CM

## 2022-03-22 DIAGNOSIS — I4891 Unspecified atrial fibrillation: Secondary | ICD-10-CM | POA: Diagnosis present

## 2022-03-22 DIAGNOSIS — R627 Adult failure to thrive: Secondary | ICD-10-CM | POA: Diagnosis present

## 2022-03-22 DIAGNOSIS — Z7984 Long term (current) use of oral hypoglycemic drugs: Secondary | ICD-10-CM

## 2022-03-22 DIAGNOSIS — Z7982 Long term (current) use of aspirin: Secondary | ICD-10-CM

## 2022-03-22 DIAGNOSIS — Z66 Do not resuscitate: Secondary | ICD-10-CM | POA: Diagnosis present

## 2022-03-22 DIAGNOSIS — R053 Chronic cough: Secondary | ICD-10-CM | POA: Diagnosis present

## 2022-03-22 DIAGNOSIS — G4700 Insomnia, unspecified: Secondary | ICD-10-CM | POA: Diagnosis not present

## 2022-03-22 DIAGNOSIS — I255 Ischemic cardiomyopathy: Secondary | ICD-10-CM | POA: Diagnosis present

## 2022-03-22 DIAGNOSIS — Z800 Family history of malignant neoplasm of digestive organs: Secondary | ICD-10-CM

## 2022-03-22 DIAGNOSIS — N1831 Chronic kidney disease, stage 3a: Secondary | ICD-10-CM | POA: Diagnosis present

## 2022-03-22 DIAGNOSIS — Z7901 Long term (current) use of anticoagulants: Secondary | ICD-10-CM

## 2022-03-22 DIAGNOSIS — F419 Anxiety disorder, unspecified: Secondary | ICD-10-CM | POA: Diagnosis present

## 2022-03-22 DIAGNOSIS — I5023 Acute on chronic systolic (congestive) heart failure: Secondary | ICD-10-CM

## 2022-03-22 DIAGNOSIS — I5043 Acute on chronic combined systolic (congestive) and diastolic (congestive) heart failure: Secondary | ICD-10-CM | POA: Diagnosis not present

## 2022-03-22 DIAGNOSIS — R54 Age-related physical debility: Secondary | ICD-10-CM | POA: Diagnosis present

## 2022-03-22 DIAGNOSIS — G3184 Mild cognitive impairment, so stated: Secondary | ICD-10-CM | POA: Diagnosis present

## 2022-03-22 DIAGNOSIS — Z6825 Body mass index (BMI) 25.0-25.9, adult: Secondary | ICD-10-CM

## 2022-03-22 DIAGNOSIS — Z20822 Contact with and (suspected) exposure to covid-19: Secondary | ICD-10-CM | POA: Diagnosis present

## 2022-03-22 DIAGNOSIS — E1122 Type 2 diabetes mellitus with diabetic chronic kidney disease: Secondary | ICD-10-CM | POA: Diagnosis present

## 2022-03-22 DIAGNOSIS — Z515 Encounter for palliative care: Secondary | ICD-10-CM

## 2022-03-22 DIAGNOSIS — K219 Gastro-esophageal reflux disease without esophagitis: Secondary | ICD-10-CM | POA: Diagnosis present

## 2022-03-22 DIAGNOSIS — Z8371 Family history of colonic polyps: Secondary | ICD-10-CM

## 2022-03-22 LAB — CBC WITH DIFFERENTIAL/PLATELET
Abs Immature Granulocytes: 0.07 10*3/uL (ref 0.00–0.07)
Basophils Absolute: 0.1 10*3/uL (ref 0.0–0.1)
Basophils Relative: 1 %
Eosinophils Absolute: 0.5 10*3/uL (ref 0.0–0.5)
Eosinophils Relative: 4 %
HCT: 32.3 % — ABNORMAL LOW (ref 39.0–52.0)
Hemoglobin: 10.7 g/dL — ABNORMAL LOW (ref 13.0–17.0)
Immature Granulocytes: 1 %
Lymphocytes Relative: 7 %
Lymphs Abs: 0.9 10*3/uL (ref 0.7–4.0)
MCH: 34.7 pg — ABNORMAL HIGH (ref 26.0–34.0)
MCHC: 33.1 g/dL (ref 30.0–36.0)
MCV: 104.9 fL — ABNORMAL HIGH (ref 80.0–100.0)
Monocytes Absolute: 1.2 10*3/uL — ABNORMAL HIGH (ref 0.1–1.0)
Monocytes Relative: 9 %
Neutro Abs: 10.3 10*3/uL — ABNORMAL HIGH (ref 1.7–7.7)
Neutrophils Relative %: 78 %
Platelets: 467 10*3/uL — ABNORMAL HIGH (ref 150–400)
RBC: 3.08 MIL/uL — ABNORMAL LOW (ref 4.22–5.81)
RDW: 13.6 % (ref 11.5–15.5)
WBC: 13.1 10*3/uL — ABNORMAL HIGH (ref 4.0–10.5)
nRBC: 0 % (ref 0.0–0.2)

## 2022-03-22 LAB — COMPREHENSIVE METABOLIC PANEL
ALT: 54 U/L — ABNORMAL HIGH (ref 0–44)
AST: 55 U/L — ABNORMAL HIGH (ref 15–41)
Albumin: 2.6 g/dL — ABNORMAL LOW (ref 3.5–5.0)
Alkaline Phosphatase: 53 U/L (ref 38–126)
Anion gap: 11 (ref 5–15)
BUN: 16 mg/dL (ref 8–23)
CO2: 23 mmol/L (ref 22–32)
Calcium: 8.2 mg/dL — ABNORMAL LOW (ref 8.9–10.3)
Chloride: 105 mmol/L (ref 98–111)
Creatinine, Ser: 1.42 mg/dL — ABNORMAL HIGH (ref 0.61–1.24)
GFR, Estimated: 48 mL/min — ABNORMAL LOW (ref >60)
Glucose, Bld: 127 mg/dL — ABNORMAL HIGH (ref 70–99)
Potassium: 3.4 mmol/L — ABNORMAL LOW (ref 3.5–5.1)
Sodium: 139 mmol/L (ref 135–145)
Total Bilirubin: 0.8 mg/dL (ref 0.3–1.2)
Total Protein: 5.9 g/dL — ABNORMAL LOW (ref 6.5–8.1)

## 2022-03-22 NOTE — ED Triage Notes (Addendum)
Seen here on tues dx with PNA and now has no energy weakness and dyspnea with rest and exertion. Denies fever. Family reports oxygen sat at home was 87-88%.  Family reports patient complains of chest pain as well.

## 2022-03-22 NOTE — ED Provider Triage Note (Signed)
Emergency Medicine Provider Triage Evaluation Note  Ronnie Joseph , a 88 y.o. male  was evaluated in triage.  Pt complains of shortness of breath, chills, fever, symptoms have been ongoing for the past several days.  He was treated for pneumonia last week.  According to daughter at the bedside patient has almost finished all his antibiotics.  Review of Systems  Positive: Chills, fever, sob Negative: Chest pain, urinary symptoms  Physical Exam  BP 115/63 (BP Location: Right Arm)   Pulse 73   Temp 98.1 F (36.7 C) (Oral)   Resp (!) 26   Ht '5\' 8"'$  (1.727 m)   Wt 77.1 kg   SpO2 92%   BMI 25.85 kg/m  Gen:   Awake, no distress   Resp:  Normal effort  MSK:   Moves extremities without difficulty  Other:  Lungs are clear to auscultation   Medical Decision Making  Medically screening exam initiated at 7:03 PM.  Appropriate orders placed.  Demyan Fugate was informed that the remainder of the evaluation will be completed by another provider, this initial triage assessment does not replace that evaluation, and the importance of remaining in the ED until their evaluation is complete.     Janeece Fitting, PA-C 04/03/2022 1903

## 2022-03-23 ENCOUNTER — Other Ambulatory Visit: Payer: Self-pay | Admitting: Orthopedic Surgery

## 2022-03-23 ENCOUNTER — Other Ambulatory Visit: Payer: Self-pay | Admitting: Cardiology

## 2022-03-23 ENCOUNTER — Encounter (HOSPITAL_COMMUNITY): Payer: Self-pay | Admitting: Internal Medicine

## 2022-03-23 DIAGNOSIS — I2510 Atherosclerotic heart disease of native coronary artery without angina pectoris: Secondary | ICD-10-CM | POA: Diagnosis present

## 2022-03-23 DIAGNOSIS — Z66 Do not resuscitate: Secondary | ICD-10-CM | POA: Diagnosis present

## 2022-03-23 DIAGNOSIS — Z515 Encounter for palliative care: Secondary | ICD-10-CM | POA: Diagnosis not present

## 2022-03-23 DIAGNOSIS — I130 Hypertensive heart and chronic kidney disease with heart failure and stage 1 through stage 4 chronic kidney disease, or unspecified chronic kidney disease: Secondary | ICD-10-CM | POA: Diagnosis present

## 2022-03-23 DIAGNOSIS — I5023 Acute on chronic systolic (congestive) heart failure: Secondary | ICD-10-CM | POA: Diagnosis not present

## 2022-03-23 DIAGNOSIS — I480 Paroxysmal atrial fibrillation: Secondary | ICD-10-CM | POA: Diagnosis present

## 2022-03-23 DIAGNOSIS — I452 Bifascicular block: Secondary | ICD-10-CM | POA: Diagnosis present

## 2022-03-23 DIAGNOSIS — J189 Pneumonia, unspecified organism: Principal | ICD-10-CM | POA: Diagnosis present

## 2022-03-23 DIAGNOSIS — J9601 Acute respiratory failure with hypoxia: Secondary | ICD-10-CM

## 2022-03-23 DIAGNOSIS — G3184 Mild cognitive impairment, so stated: Secondary | ICD-10-CM | POA: Diagnosis present

## 2022-03-23 DIAGNOSIS — R54 Age-related physical debility: Secondary | ICD-10-CM | POA: Diagnosis present

## 2022-03-23 DIAGNOSIS — Z7189 Other specified counseling: Secondary | ICD-10-CM | POA: Diagnosis not present

## 2022-03-23 DIAGNOSIS — Z20822 Contact with and (suspected) exposure to covid-19: Secondary | ICD-10-CM | POA: Diagnosis present

## 2022-03-23 DIAGNOSIS — R627 Adult failure to thrive: Secondary | ICD-10-CM | POA: Diagnosis present

## 2022-03-23 DIAGNOSIS — N1831 Chronic kidney disease, stage 3a: Secondary | ICD-10-CM | POA: Diagnosis present

## 2022-03-23 DIAGNOSIS — Z7982 Long term (current) use of aspirin: Secondary | ICD-10-CM | POA: Diagnosis not present

## 2022-03-23 DIAGNOSIS — E039 Hypothyroidism, unspecified: Secondary | ICD-10-CM | POA: Diagnosis present

## 2022-03-23 DIAGNOSIS — D500 Iron deficiency anemia secondary to blood loss (chronic): Secondary | ICD-10-CM | POA: Diagnosis present

## 2022-03-23 DIAGNOSIS — I5042 Chronic combined systolic (congestive) and diastolic (congestive) heart failure: Secondary | ICD-10-CM | POA: Diagnosis not present

## 2022-03-23 DIAGNOSIS — R6520 Severe sepsis without septic shock: Secondary | ICD-10-CM | POA: Diagnosis present

## 2022-03-23 DIAGNOSIS — N179 Acute kidney failure, unspecified: Secondary | ICD-10-CM | POA: Diagnosis not present

## 2022-03-23 DIAGNOSIS — I340 Nonrheumatic mitral (valve) insufficiency: Secondary | ICD-10-CM | POA: Diagnosis present

## 2022-03-23 DIAGNOSIS — Z7901 Long term (current) use of anticoagulants: Secondary | ICD-10-CM | POA: Diagnosis not present

## 2022-03-23 DIAGNOSIS — E876 Hypokalemia: Secondary | ICD-10-CM

## 2022-03-23 DIAGNOSIS — E1122 Type 2 diabetes mellitus with diabetic chronic kidney disease: Secondary | ICD-10-CM | POA: Diagnosis present

## 2022-03-23 DIAGNOSIS — I472 Ventricular tachycardia, unspecified: Secondary | ICD-10-CM | POA: Diagnosis not present

## 2022-03-23 DIAGNOSIS — A419 Sepsis, unspecified organism: Secondary | ICD-10-CM | POA: Diagnosis present

## 2022-03-23 DIAGNOSIS — I5043 Acute on chronic combined systolic (congestive) and diastolic (congestive) heart failure: Secondary | ICD-10-CM | POA: Diagnosis not present

## 2022-03-23 LAB — LACTIC ACID, PLASMA: Lactic Acid, Venous: 1.7 mmol/L (ref 0.5–1.9)

## 2022-03-23 LAB — URINALYSIS, ROUTINE W REFLEX MICROSCOPIC
Bilirubin Urine: NEGATIVE
Glucose, UA: >=500 mg/dL — AB
Hgb urine dipstick: NEGATIVE
Ketones, ur: NEGATIVE mg/dL
Leukocytes,Ua: NEGATIVE
Nitrite: NEGATIVE
Protein, ur: 30 mg/dL — AB
Specific Gravity, Urine: 1.023 (ref 1.005–1.030)
pH: 5 (ref 5.0–8.0)

## 2022-03-23 LAB — BASIC METABOLIC PANEL
Anion gap: 11 (ref 5–15)
BUN: 15 mg/dL (ref 8–23)
CO2: 21 mmol/L — ABNORMAL LOW (ref 22–32)
Calcium: 8 mg/dL — ABNORMAL LOW (ref 8.9–10.3)
Chloride: 107 mmol/L (ref 98–111)
Creatinine, Ser: 1.33 mg/dL — ABNORMAL HIGH (ref 0.61–1.24)
GFR, Estimated: 51 mL/min — ABNORMAL LOW (ref >60)
Glucose, Bld: 110 mg/dL — ABNORMAL HIGH (ref 70–99)
Potassium: 3.6 mmol/L (ref 3.5–5.1)
Sodium: 139 mmol/L (ref 135–145)

## 2022-03-23 LAB — HEMOGLOBIN A1C
Hgb A1c MFr Bld: 5.8 % — ABNORMAL HIGH (ref 4.8–5.6)
Mean Plasma Glucose: 119.76 mg/dL

## 2022-03-23 LAB — CBC
HCT: 35.9 % — ABNORMAL LOW (ref 39.0–52.0)
Hemoglobin: 12.1 g/dL — ABNORMAL LOW (ref 13.0–17.0)
MCH: 34.9 pg — ABNORMAL HIGH (ref 26.0–34.0)
MCHC: 33.7 g/dL (ref 30.0–36.0)
MCV: 103.5 fL — ABNORMAL HIGH (ref 80.0–100.0)
Platelets: 440 10*3/uL — ABNORMAL HIGH (ref 150–400)
RBC: 3.47 MIL/uL — ABNORMAL LOW (ref 4.22–5.81)
RDW: 13.5 % (ref 11.5–15.5)
WBC: 12.8 10*3/uL — ABNORMAL HIGH (ref 4.0–10.5)
nRBC: 0 % (ref 0.0–0.2)

## 2022-03-23 LAB — SARS CORONAVIRUS 2 BY RT PCR: SARS Coronavirus 2 by RT PCR: NEGATIVE

## 2022-03-23 LAB — STREP PNEUMONIAE URINARY ANTIGEN: Strep Pneumo Urinary Antigen: NEGATIVE

## 2022-03-23 LAB — GLUCOSE, CAPILLARY: Glucose-Capillary: 97 mg/dL (ref 70–99)

## 2022-03-23 LAB — PROCALCITONIN: Procalcitonin: <0.1 ng/mL

## 2022-03-23 LAB — CBG MONITORING, ED
Glucose-Capillary: 118 mg/dL — ABNORMAL HIGH (ref 70–99)
Glucose-Capillary: 93 mg/dL (ref 70–99)
Glucose-Capillary: 112 mg/dL — ABNORMAL HIGH (ref 70–99)

## 2022-03-23 LAB — MRSA NEXT GEN BY PCR, NASAL: MRSA by PCR Next Gen: NOT DETECTED

## 2022-03-23 LAB — MAGNESIUM: Magnesium: 2.5 mg/dL — ABNORMAL HIGH (ref 1.7–2.4)

## 2022-03-23 MED ORDER — AMIODARONE HCL 200 MG PO TABS
200.0000 mg | ORAL_TABLET | Freq: Every day | ORAL | Status: DC
  Administered 2022-03-23 – 2022-03-29 (×7): 200 mg via ORAL
  Filled 2022-03-23 (×7): qty 1

## 2022-03-23 MED ORDER — LORATADINE 10 MG PO TABS
10.0000 mg | ORAL_TABLET | Freq: Every day | ORAL | Status: DC
Start: 2022-03-23 — End: 2022-03-31
  Administered 2022-03-23 – 2022-03-31 (×9): 10 mg via ORAL
  Filled 2022-03-23 (×9): qty 1

## 2022-03-23 MED ORDER — INSULIN ASPART 100 UNIT/ML IJ SOLN
0.0000 [IU] | Freq: Every day | INTRAMUSCULAR | Status: DC
  Administered 2022-03-28: 3 [IU] via SUBCUTANEOUS

## 2022-03-23 MED ORDER — PANTOPRAZOLE SODIUM 40 MG PO TBEC
40.0000 mg | DELAYED_RELEASE_TABLET | Freq: Every day | ORAL | Status: DC
Start: 2022-03-23 — End: 2022-03-31
  Administered 2022-03-23 – 2022-03-31 (×9): 40 mg via ORAL
  Filled 2022-03-23 (×9): qty 1

## 2022-03-23 MED ORDER — ROSUVASTATIN CALCIUM 20 MG PO TABS
20.0000 mg | ORAL_TABLET | Freq: Every day | ORAL | Status: DC
  Administered 2022-03-23 – 2022-03-31 (×9): 20 mg via ORAL
  Filled 2022-03-23 (×9): qty 1

## 2022-03-23 MED ORDER — MONTELUKAST SODIUM 10 MG PO TABS
10.0000 mg | ORAL_TABLET | Freq: Every day | ORAL | Status: DC
  Administered 2022-03-23 – 2022-03-31 (×9): 10 mg via ORAL
  Filled 2022-03-23 (×9): qty 1

## 2022-03-23 MED ORDER — CEFEPIME HCL 2 G IV SOLR
2.0000 g | Freq: Two times a day (BID) | INTRAVENOUS | Status: DC
  Administered 2022-03-23 – 2022-03-29 (×12): 2 g via INTRAVENOUS
  Filled 2022-03-23 (×12): qty 12.5

## 2022-03-23 MED ORDER — LEVOTHYROXINE SODIUM 88 MCG PO TABS
88.0000 ug | ORAL_TABLET | Freq: Every day | ORAL | Status: DC
  Administered 2022-03-23 – 2022-03-31 (×9): 88 ug via ORAL
  Filled 2022-03-23 (×10): qty 1

## 2022-03-23 MED ORDER — APIXABAN 2.5 MG PO TABS
2.5000 mg | ORAL_TABLET | Freq: Two times a day (BID) | ORAL | Status: DC
  Administered 2022-03-23 – 2022-03-31 (×17): 2.5 mg via ORAL
  Filled 2022-03-23 (×17): qty 1

## 2022-03-23 MED ORDER — VANCOMYCIN HCL 750 MG/150ML IV SOLN
750.0000 mg | INTRAVENOUS | Status: DC
Start: 2022-03-24 — End: 2022-03-24
  Administered 2022-03-24: 750 mg via INTRAVENOUS
  Filled 2022-03-23 (×2): qty 150

## 2022-03-23 MED ORDER — VANCOMYCIN HCL 1500 MG/300ML IV SOLN
1500.0000 mg | Freq: Once | INTRAVENOUS | Status: AC
  Administered 2022-03-23: 1500 mg via INTRAVENOUS
  Filled 2022-03-23: qty 300

## 2022-03-23 MED ORDER — INSULIN ASPART 100 UNIT/ML IJ SOLN
0.0000 [IU] | Freq: Three times a day (TID) | INTRAMUSCULAR | Status: DC
  Administered 2022-03-29: 3 [IU] via SUBCUTANEOUS
  Administered 2022-03-29: 2 [IU] via SUBCUTANEOUS
  Administered 2022-03-30: 3 [IU] via SUBCUTANEOUS
  Administered 2022-03-30: 2 [IU] via SUBCUTANEOUS
  Administered 2022-03-30: 3 [IU] via SUBCUTANEOUS
  Administered 2022-03-31: 1 [IU] via SUBCUTANEOUS

## 2022-03-23 MED ORDER — FUROSEMIDE 20 MG PO TABS
20.0000 mg | ORAL_TABLET | Freq: Every day | ORAL | Status: DC
  Filled 2022-03-23: qty 1

## 2022-03-23 MED ORDER — SODIUM CHLORIDE 0.9 % IV SOLN
2.0000 g | Freq: Once | INTRAVENOUS | Status: AC
  Administered 2022-03-23: 2 g via INTRAVENOUS
  Filled 2022-03-23: qty 12.5

## 2022-03-23 MED ORDER — POTASSIUM CHLORIDE CRYS ER 20 MEQ PO TBCR
40.0000 meq | EXTENDED_RELEASE_TABLET | Freq: Once | ORAL | Status: AC
Start: 2022-03-23 — End: 2022-03-23
  Administered 2022-03-23: 40 meq via ORAL
  Filled 2022-03-23: qty 2

## 2022-03-23 MED ORDER — EMPAGLIFLOZIN 10 MG PO TABS
10.0000 mg | ORAL_TABLET | Freq: Every day | ORAL | Status: DC
Start: 2022-03-23 — End: 2022-03-31
  Administered 2022-03-24 – 2022-03-31 (×8): 10 mg via ORAL
  Filled 2022-03-23 (×12): qty 1

## 2022-03-23 MED ORDER — ASPIRIN 81 MG PO CHEW
81.0000 mg | CHEWABLE_TABLET | Freq: Every day | ORAL | Status: DC
  Administered 2022-03-23 – 2022-03-31 (×9): 81 mg via ORAL
  Filled 2022-03-23 (×9): qty 1

## 2022-03-23 MED ORDER — FERROUS SULFATE 325 (65 FE) MG PO TABS
325.0000 mg | ORAL_TABLET | Freq: Every day | ORAL | Status: DC
  Administered 2022-03-23 – 2022-03-31 (×9): 325 mg via ORAL
  Filled 2022-03-23 (×9): qty 1

## 2022-03-23 NOTE — Progress Notes (Signed)
Pharmacy Antibiotic Note  Ronnie Joseph is a 88 y.o. male admitted on 03/19/2022 with pneumonia.  Pharmacy has been consulted for vancomycin an cefepime dosing. Patient recently (03/17/22) started outpatient oral azithromycin and augmentin for pneumonia, but had continued symptoms. WBC 13.1, CrCl 1.4 (baseline 1.2-1.7 recently)  Plan: Vancomycin '1500mg'$  x1 then '750mg'$  IV q24 (eAUC 407) Cefepime 2G IV q12 hours  Height: '5\' 8"'$  (172.7 cm) Weight: 77.1 kg (170 lb) IBW/kg (Calculated) : 68.4  Temp (24hrs), Avg:98.3 F (36.8 C), Min:97.9 F (36.6 C), Max:99 F (37.2 C)  Recent Labs  Lab 03/17/22 0858 03/17/22 1005 03/30/2022 1903 03/23/22 0349  WBC 10.3  --  13.1*  --   CREATININE 1.43*  --  1.42*  --   LATICACIDVEN  --  1.1  --  1.7    Estimated Creatinine Clearance: 34.8 mL/min (A) (by C-G formula based on SCr of 1.42 mg/dL (H)).    Allergies  Allergen Reactions   Lisinopril Swelling   Telmisartan Hives   Atorvastatin Rash   Sulfa Antibiotics Rash   Sulfamethoxazole-Trimethoprim Rash    Antimicrobials this admission: Cefepime 6/12>> Vancomycin 6/12>>  Microbiology results: pending  Thank you for allowing pharmacy to be a part of this patient's care.  Jodean Lima Kaydie Petsch 03/23/2022 5:08 AM

## 2022-03-23 NOTE — ED Provider Notes (Signed)
[facility] EMERGENCY DEPARTMENT Provider Note   CSN: [id number] Arrival date & time: 03/18/2022  1824     History  Chief Complaint  Patient presents with   Shortness of Breath    Lavance Beazer is a 88 y.o. male.  The history is provided by the patient and a relative.  Shortness of Breath Associated symptoms: cough    87 year old male with history of A-fib on Eliquis, coronary artery disease, hyperlipidemia, presenting to the ED with worsening shortness of breath.  Evaluated here on 03/17/22 and diagnosed with CAP.  Was given dose of rocephin and azithromycin in the ED, discharged home with course of Augmentin and azithromycin.  He has been taking as directed but reports symptoms continue worsening.  He continues having chills but no noted fever.  Cough is intermittently productive.  He denies any chest pain.  Patient was noted to become hypoxic while in the waiting room to 87%, was placed on supplemental oxygen.  States he does feel much better with supplemental O2.  He is not generally oxygen dependent.  Home Medications Prior to Admission medications   Medication Sig Start Date End Date Taking? Authorizing Provider  acetaminophen (TYLENOL) 500 MG tablet Take 1,000 mg by mouth every 8 (eight) hours as needed for mild pain.    [provider]  amiodarone (PACERONE) 200 MG tablet TAKE 1 TABLET DAILY 03/10/22   Donato Heinz, MD  amoxicillin-clavulanate (AUGMENTIN) 875-125 MG tablet Take 1 tablet by mouth every 12 (twelve) hours. 03/17/22   Loni Beckwith, PA-C  apixaban (ELIQUIS) 2.5 MG TABS tablet Take 1 tablet (2.5 mg total) by mouth 2 (two) times daily. 07/04/21   Donato Heinz, MD  aspirin 81 MG chewable tablet Chew 81 mg by mouth daily.    [provider]  azithromycin (ZITHROMAX Z-PAK) 250 MG tablet 500 mg PO on day 1, followed by 250 mg PO once daily  03/17/22   Loni Beckwith, PA-C  CALCIUM CITRATE PO Take 200 mg by mouth  2 (two) times daily.    [provider]  Dupilumab (DUPIXENT [place]) Inject into the skin every 14 (fourteen) days.    [provider]  Emollient (CERAVE) LOTN Apply topically in the morning and at bedtime.    [provider]  ferrous sulfate 325 (65 FE) MG tablet Take 325 mg by mouth daily with breakfast.    [provider]  Fexofenadine HCl (MUCINEX ALLERGY PO) Take by mouth daily.    [provider]  fluticasone (FLONASE) 50 MCG/ACT nasal spray Place 2 sprays into both nostrils daily as needed.    [provider]  furosemide (LASIX) 20 MG tablet Take 20 mg once daily for 5 days and then take as needed for weight gain of 3 lbs in one day or 5 lbs in one week 10/16/21   Donato Heinz, MD  JARDIANCE 10 MG TABS tablet TAKE 1 TABLET DAILY BEFORE BREAKFAST 03/10/22   Donato Heinz, MD  levocetirizine (XYZAL) 5 MG tablet TAKE 1 TABLET DAILY 03/16/22   Fargo, Amy E, NP  levothyroxine (SYNTHROID) 88 MCG tablet TAKE 1 TABLET DAILY 03/10/22   Fargo, Amy E, NP  losartan (COZAAR) 50 MG tablet Take 1 tablet (50 mg total) by mouth in the morning. 07/29/21   Donato Heinz, MD  montelukast (SINGULAIR) 10 MG tablet TAKE 1 TABLET DAILY 11/03/21   Fargo, Amy E, NP  Multiple Vitamin (MULTIVITAMIN PO) Take 1 tablet by mouth  daily.    [provider]  pantoprazole (PROTONIX) 40 MG tablet TAKE 1 TABLET DAILY 11/03/21   Fargo, Amy E, NP  polyethylene glycol (MIRALAX / GLYCOLAX) 17 g packet Take 17 g by mouth daily.    [provider]  rosuvastatin (CRESTOR) 20 MG tablet Take 1 tablet (20 mg total) by mouth daily. 09/25/21   Fargo, Amy E, NP  senna (SENOKOT) 8.6 MG TABS tablet Take 8.6 mg by mouth 2 (two) times daily.    [provider]  triamcinolone ointment (KENALOG) 0.1 % Apply 1 application. topically 2 (two) times daily. 12/23/21   [provider]      Allergies    Lisinopril, Telmisartan, Atorvastatin,  Sulfa antibiotics, and Sulfamethoxazole-trimethoprim    Review of Systems   Review of Systems  Respiratory:  Positive for cough and shortness of breath.   All other systems reviewed and are negative.   Physical Exam Updated Vital Signs BP (!) 128/48 (BP Location: Right Arm)   Pulse 75   Temp 97.9 F (36.6 C) (Oral)   Resp (!) 22   Ht '5\' 8"'$  (1.727 m)   Wt 77.1 kg   SpO2 92%   BMI 25.85 kg/m   Physical Exam Vitals and nursing note reviewed.  Constitutional:      Appearance: He is well-developed.  HENT:     Head: Normocephalic and atraumatic.  Eyes:     Conjunctiva/sclera: Conjunctivae normal.     Pupils: Pupils are equal, round, and reactive to light.  Cardiovascular:     Rate and Rhythm: Normal rate and regular rhythm.     Heart sounds: Normal heart sounds.  Pulmonary:     Effort: Pulmonary effort is normal.     Breath sounds: Rhonchi present.     Comments: On 3L supplemental O2 on exam, does get winded if talking quickly or prolonged period of time Abdominal:     General: Bowel sounds are normal.     Palpations: Abdomen is soft.  Musculoskeletal:        General: Normal range of motion.     Cervical back: Normal range of motion.  Skin:    General: Skin is warm and dry.  Neurological:     Mental Status: He is alert and oriented to person, place, and time.     ED Results / Procedures / Treatments   Labs (all labs ordered are listed, but only abnormal results are displayed) Labs Reviewed  CBC WITH DIFFERENTIAL/PLATELET - Abnormal; Notable for the following components:      Result Value   WBC 13.1 (*)    RBC 3.08 (*)    Hemoglobin 10.7 (*)    HCT 32.3 (*)    MCV 104.9 (*)    MCH 34.7 (*)    Platelets 467 (*)    Neutro Abs 10.3 (*)    Monocytes Absolute 1.2 (*)    All other components within normal limits  COMPREHENSIVE METABOLIC PANEL - Abnormal; Notable for the following components:   Potassium 3.4 (*)    Glucose, Bld 127 (*)    Creatinine, Ser 1.42  (*)    Calcium 8.2 (*)    Total Protein 5.9 (*)    Albumin 2.6 (*)    AST 55 (*)    ALT 54 (*)    GFR, Estimated 48 (*)    All other components within normal limits  CULTURE, BLOOD (ROUTINE X 2)  CULTURE, BLOOD (ROUTINE X 2)  URINALYSIS, ROUTINE W REFLEX MICROSCOPIC  LACTIC ACID, PLASMA  LACTIC ACID, PLASMA    EKG None  Radiology DG Chest 2 View  Result Date: 04/02/2022 CLINICAL DATA:  Shortness of breath, pneumonia EXAM: CHEST - 2 VIEW COMPARISON:  03/17/2022 FINDINGS: Cardiomegaly. New, heterogeneous airspace opacity most conspicuously present in the right upper lobe although likely bilateral. Small bilateral pleural effusions. Disc degenerative disease of the thoracic spine. IMPRESSION: 1. New, heterogeneous airspace opacity most conspicuously present in the right upper lobe although likely bilateral. Small bilateral pleural effusions. Findings are consistent with multifocal infection. 2. Cardiomegaly. Electronically Signed   By: Delanna Ahmadi M.D.   On: 03/21/2022 19:27    Procedures Procedures    CRITICAL CARE Performed by: Larene Pickett   Total critical care time: 45 minutes  Critical care time was exclusive of separately billable procedures and treating other patients.  Critical care was necessary to treat or prevent imminent or life-threatening deterioration.  Critical care was time spent personally by me on the following activities: development of treatment plan with patient and/or surrogate as well as nursing, discussions with consultants, evaluation of patient's response to treatment, examination of patient, obtaining history from patient or surrogate, ordering and performing treatments and interventions, ordering and review of laboratory studies, ordering and review of radiographic studies, pulse oximetry and re-evaluation of patient's condition.   Medications Ordered in ED Medications  ceFEPIme (MAXIPIME) 2 g in sodium chloride 0.9 % 100 mL IVPB (has no  administration in time range)  vancomycin (VANCOREADY) IVPB 1500 mg/300 mL (has no administration in time range)    ED Course/ Medical Decision Making/ A&P                           Medical Decision Making Amount and/or Complexity of Data Reviewed Radiology: ordered and independent interpretation performed. ECG/medicine tests: ordered and independent interpretation performed.  Risk Prescription drug management. Decision regarding hospitalization.   88 year old male presenting to the ED with shortness of breath.  Diagnosed with pneumonia on 03/17/2022, received Rocephin and azithromycin in the ED, discharged home with azithromycin and Augmentin.  Has been taking as directed but symptoms worsening.  Afebrile, nontoxic, but did become hypoxic while in the lobby down to 87% on room air.  He is currently on 3 L supplemental oxygen and maintaining sats 95% or greater.  He does get winded with fluid conversation.  Does have some rhonchi on exam.  Labs with leukocytosis, no significant electrolyte derangement.  Chest x-ray with findings of multifocal pneumonia.  We will add on lactate, blood cultures.  Given he has failed outpatient treatment, will broaden antibiotics to vancomycin and cefepime.  He will require admission given oxygen requirement.  Patient is agreeable.  Discussed with hospitalist, Dr. Marlowe Sax-- will admit for ongoing care.  Final Clinical Impression(s) / ED Diagnoses Final diagnoses:  Multifocal pneumonia    Rx / DC Orders ED Discharge Orders     None         Larene Pickett, PA-C 03/23/22 0345    Fatima Blank, MD 03/23/22 [phone]

## 2022-03-23 NOTE — ED Notes (Signed)
Condom cath connected to urinary bag.

## 2022-03-23 NOTE — ED Notes (Signed)
CONDOM CATH HOOKED TOCANSTER

## 2022-03-23 NOTE — H&P (Signed)
History and Physical    Heinz Eckert JSH:[id number] DOB: [birth year] DOA: 03/13/2022  PCP: [name], NP  Patient coming from: Home  Chief Complaint: Shortness of breath  HPI: Ronnie Joseph is a 88 y.o. male with medical history significant of CAD status post PCI 05/2019, ischemic cardiomyopathy, paroxysmal A-fib on Eliquis, hypertension, hyperlipidemia, mild cognitive impairment, type 2 diabetes, hypothyroidism, OSA, chronic cough, GERD, iron deficiency anemia due to chronic blood loss from AVMs and GI advised against repeat endoscopic procedures given his age.  Recently seen in the ED on 6/6 for fevers, chills, and generalized weakness.  He was diagnosed with pneumonia (chest x-ray showing hazy right infrahilar opacity) and discharged on Augmentin and azithromycin.  He returns to the ED today complaining of generalized weakness, shortness of breath, and cough.  SPO2 86-87% on room air and placed on 3 L supplemental oxygen.  Afebrile and not tachycardic or hypotensive.  Labs showing WBC 13.1, hemoglobin 10.7 (was 11.4 on 03/17/2022), platelet count 467k.  Sodium 139, potassium 3.4, chloride 105, bicarb 23, BUN 16, creatinine 1.4 (stable), glucose 127.  Transaminases mildly elevated and improved compared to prior labs.  Alk phos and T. bili normal.  Lactate and blood cultures pending.  Chest x-ray showing findings consistent with multifocal pneumonia. Patient was given vancomycin and cefepime.  Patient reports 1 week history of generalized weakness, cough, shortness of breath, and poor appetite.  States he feels so weak that he is barely able to get up and walk.  States he was seen in the emergency room a few days ago and was diagnosed with pneumonia and prescribed antibiotics which she has been taking but he is not improving.  He does not use oxygen at home.  Denies nausea, vomiting, or abdominal pain.  He takes a laxative and his stool is loose.  Reports chronic bilateral lower extremity edema for which  she takes furosemide as needed if he gains weight.  He is vaccinated against COVID.  He is a retired Software engineer.  Review of Systems:  Review of Systems  All other systems reviewed and are negative.   Past Medical History:  Diagnosis Date   Acute anemia 09/26/2020   Anemia due to acute blood loss 05/25/2019   Chronic maxillary sinusitis 08/06/2017   Coronary artery disease due to lipid rich plaque 07/27/2019   Essential hypertension 02/14/2003   Fainting 05/30/2019   Fall 05/25/1999   High cholesterol 03/14/2001   History of basal cell carcinoma 11/02/2019   History of pneumonia    History of snoring    History of upper respiratory infection    Irregular heartbeat 08/06/2003   Ischemic cardiomyopathy 05/30/2019   Mild cognitive impairment 05/15/2019   NSTEMI (non-ST elevated myocardial infarction) ([place]) 06/03/2019   Poor renal function 03/19/2005   Primary osteoarthritis of shoulder 10/24/2015   Psoriasis 12/29/2004   Rash and nonspecific skin eruption 04/30/2020   Schatzki's ring 08/07/2020   Sepsis with acute hypoxic respiratory failure ([place]) 09/26/2020   Sleep apnea 08/13/2003    Past Surgical History:  Procedure Laterality Date   APPENDECTOMY     CATARACT EXTRACTION     COLONOSCOPY     HERNIA REPAIR     RIGHT HEART CATHETERIZATION WITH ADENOSINE STUDY     UPPER GI ENDOSCOPY       reports that he quit smoking about 63 years ago. His smoking use included cigarettes. He smoked an average of .25 packs per day. He has never used smokeless tobacco. He reports current  alcohol use of about 3.0 standard drinks of alcohol per week. He reports that he does not use drugs.  Allergies  Allergen Reactions   Lisinopril Swelling   Telmisartan Hives   Atorvastatin Rash   Sulfa Antibiotics Rash   Sulfamethoxazole-Trimethoprim Rash    Family History  Problem Relation Age of Onset   Lung disease Mother    Heart disease Father    Heart attack Father    Brain cancer Brother     Breast cancer Daughter    Celiac disease Daughter    Diabetes Son    Diabetes Mellitus I Son    Stomach cancer Other    Pancreatic cancer Other    Esophageal cancer Other    Colon polyps Other    Colon cancer Other     Prior to Admission medications   Medication Sig Start Date End Date Taking? Authorizing Provider  acetaminophen (TYLENOL) 500 MG tablet Take 1,000 mg by mouth every 8 (eight) hours as needed for mild pain.    [provider]  amiodarone (PACERONE) 200 MG tablet TAKE 1 TABLET DAILY 03/10/22   Donato Heinz, MD  amoxicillin-clavulanate (AUGMENTIN) 875-125 MG tablet Take 1 tablet by mouth every 12 (twelve) hours. 03/17/22   Loni Beckwith, PA-C  apixaban (ELIQUIS) 2.5 MG TABS tablet Take 1 tablet (2.5 mg total) by mouth 2 (two) times daily. 07/04/21   Donato Heinz, MD  aspirin 81 MG chewable tablet Chew 81 mg by mouth daily.    [provider]  azithromycin (ZITHROMAX Z-PAK) 250 MG tablet 500 mg PO on day 1, followed by 250 mg PO once daily  03/17/22   Loni Beckwith, PA-C  CALCIUM CITRATE PO Take 200 mg by mouth 2 (two) times daily.    [provider]  Dupilumab (DUPIXENT [place]) Inject into the skin every 14 (fourteen) days.    [provider]  Emollient (CERAVE) LOTN Apply topically in the morning and at bedtime.    [provider]  ferrous sulfate 325 (65 FE) MG tablet Take 325 mg by mouth daily with breakfast.    [provider]  Fexofenadine HCl (MUCINEX ALLERGY PO) Take by mouth daily.    [provider]  fluticasone (FLONASE) 50 MCG/ACT nasal spray Place 2 sprays into both nostrils daily as needed.    [provider]  furosemide (LASIX) 20 MG tablet Take 20 mg once daily for 5 days and then take as needed for weight gain of 3 lbs in one day or 5 lbs in one week 10/16/21   Donato Heinz, MD  JARDIANCE 10 MG TABS tablet TAKE 1 TABLET DAILY BEFORE BREAKFAST 03/10/22    Donato Heinz, MD  levocetirizine (XYZAL) 5 MG tablet TAKE 1 TABLET DAILY 03/16/22   Fargo, Amy E, NP  levothyroxine (SYNTHROID) 88 MCG tablet TAKE 1 TABLET DAILY 03/10/22   Fargo, Amy E, NP  losartan (COZAAR) 50 MG tablet Take 1 tablet (50 mg total) by mouth in the morning. 07/29/21   Donato Heinz, MD  montelukast (SINGULAIR) 10 MG tablet TAKE 1 TABLET DAILY 11/03/21   Fargo, Amy E, NP  Multiple Vitamin (MULTIVITAMIN PO) Take 1 tablet by mouth daily.    [provider]  pantoprazole (PROTONIX) 40 MG tablet TAKE 1 TABLET DAILY 11/03/21   Fargo, Amy E, NP  polyethylene glycol (MIRALAX / GLYCOLAX) 17 g packet Take 17 g by mouth daily.    [provider]  rosuvastatin (CRESTOR) 20  MG tablet Take 1 tablet (20 mg total) by mouth daily. 09/25/21   Fargo, Amy E, NP  senna (SENOKOT) 8.6 MG TABS tablet Take 8.6 mg by mouth 2 (two) times daily.    [provider]  triamcinolone ointment (KENALOG) 0.1 % Apply 1 application. topically 2 (two) times daily. 12/23/21   [provider]    Physical Exam: Vitals:   03/28/2022 2331 03/23/22 0207 03/23/22 0210 03/23/22 0318  BP: (!) 131/54 (!) 128/48  (!) 122/57  Pulse: 76 77 75 82  Resp: 14 (!) 22  (!) 30  Temp: 97.9 F (36.6 C)     TempSrc: Oral     SpO2: 92% (!) 87% 92% 94%  Weight:      Height:        Physical Exam Vitals reviewed.  Constitutional:      General: He is not in acute distress. HENT:     Head: Normocephalic and atraumatic.  Eyes:     Extraocular Movements: Extraocular movements intact.  Cardiovascular:     Rate and Rhythm: Normal rate and regular rhythm.     Pulses: Normal pulses.  Pulmonary:     Effort: No respiratory distress.     Breath sounds: No wheezing.     Comments: Mildly tachypneic with respiratory rate in the low 20s Abdominal:     General: Bowel sounds are normal. There is no distension.     Palpations: Abdomen is soft.     Tenderness: There is no abdominal  tenderness.  Musculoskeletal:     Cervical back: Normal range of motion.     Right lower leg: Edema present.     Left lower leg: Edema present.     Comments: 3+ pitting edema bilateral lower extremities  Skin:    General: Skin is warm and dry.  Neurological:     General: No focal deficit present.     Mental Status: He is alert and oriented to person, place, and time.      Labs on Admission: I have personally reviewed following labs and imaging studies  CBC: Recent Labs  Lab 03/17/22 0858 04/02/2022 1903  WBC 10.3 13.1*  NEUTROABS 7.5 10.3*  HGB 11.4* 10.7*  HCT 33.5* 32.3*  MCV 103.4* 104.9*  PLT 297 938*   Basic Metabolic Panel: Recent Labs  Lab 03/17/22 0858 03/31/2022 1903  NA 140 139  K 3.4* 3.4*  CL 108 105  CO2 24 23  GLUCOSE 106* 127*  BUN 26* 16  CREATININE 1.43* 1.42*  CALCIUM 8.5* 8.2*   GFR: Estimated Creatinine Clearance: 34.8 mL/min (A) (by C-G formula based on SCr of 1.42 mg/dL (H)). Liver Function Tests: Recent Labs  Lab 03/17/22 0858 03/21/2022 1903  AST 78* 55*  ALT 74* 54*  ALKPHOS 44 53  BILITOT 0.7 0.8  PROT 6.1* 5.9*  ALBUMIN 2.8* 2.6*   No results for input(s): "LIPASE", "AMYLASE" in the last 168 hours. No results for input(s): "AMMONIA" in the last 168 hours. Coagulation Profile: No results for input(s): "INR", "PROTIME" in the last 168 hours. Cardiac Enzymes: No results for input(s): "CKTOTAL", "CKMB", "CKMBINDEX", "TROPONINI" in the last 168 hours. BNP (last 3 results) No results for input(s): "PROBNP" in the last 8760 hours. HbA1C: No results for input(s): "HGBA1C" in the last 72 hours. CBG: No results for input(s): "GLUCAP" in the last 168 hours. Lipid Profile: No results for input(s): "CHOL", "HDL", "LDLCALC", "TRIG", "CHOLHDL", "LDLDIRECT" in the last 72 hours. Thyroid Function Tests: No results for input(s): "  TSH", "T4TOTAL", "FREET4", "T3FREE", "THYROIDAB" in the last 72 hours. Anemia Panel: No results for input(s):  "VITAMINB12", "FOLATE", "FERRITIN", "TIBC", "IRON", "RETICCTPCT" in the last 72 hours. Urine analysis:    Component Value Date/Time   COLORURINE YELLOW 03/17/2022 1020   APPEARANCEUR CLEAR 03/17/2022 1020   LABSPEC 1.018 03/17/2022 1020   PHURINE 5.0 03/17/2022 1020   GLUCOSEU >=500 (A) 03/17/2022 1020   HGBUR NEGATIVE 03/17/2022 1020   BILIRUBINUR NEGATIVE 03/17/2022 1020   KETONESUR NEGATIVE 03/17/2022 1020   PROTEINUR 30 (A) 03/17/2022 1020   NITRITE NEGATIVE 03/17/2022 1020   LEUKOCYTESUR NEGATIVE 03/17/2022 1020    Radiological Exams on Admission: I have personally reviewed images DG Chest 2 View  Result Date: 03/30/2022 CLINICAL DATA:  Shortness of breath, pneumonia EXAM: CHEST - 2 VIEW COMPARISON:  03/17/2022 FINDINGS: Cardiomegaly. New, heterogeneous airspace opacity most conspicuously present in the right upper lobe although likely bilateral. Small bilateral pleural effusions. Disc degenerative disease of the thoracic spine. IMPRESSION: 1. New, heterogeneous airspace opacity most conspicuously present in the right upper lobe although likely bilateral. Small bilateral pleural effusions. Findings are consistent with multifocal infection. 2. Cardiomegaly. Electronically Signed   By: Delanna Ahmadi M.D.   On: 03/26/2022 19:27    EKG: Independently reviewed.  Sinus rhythm with first-degree AV block, RBBB.  No significant change since prior tracing.  Assessment and Plan  Sepsis secondary to multifocal pneumonia Acute hypoxic respiratory failure Failed outpatient antibiotic therapy and chest x-ray showing worsening pneumonia.  SPO2 86-87% on room air, currently requiring 3 L supplemental oxygen.  Meets 2 SIRS criteria (tachypnea and leukocytosis).  No tachycardia or hypotension. -Continue vancomycin and cefepime -Lactate pending -Blood cultures pending -SARS-CoV-2 PCR -Strep pneumo and Legionella urinary antigens -MRSA PCR screen -Monitor WBC count -Continue supplemental oxygen,  wean as tolerated  Mild hypokalemia -Replace potassium and continue to monitor -Check magnesium level and replace if low  CAD status post PCI 05/2019 Not endorsing chest pain. -Continue aspirin and statin  Chronic combined CHF/ischemic cardiomyopathy Echo done October 2022 showing LVEF 40 to 33%, grade 1 diastolic dysfunction.  He has 3+ pitting edema bilateral lower extremities.  Takes Lasix 20 mg prn.  -Continue p.o. Lasix 20 mg daily  Paroxysmal A-fib Currently in sinus rhythm. -Continue Eliquis and amiodarone  Hyperlipidemia -Continue Crestor  Non-insulin-dependent type 2 diabetes -A1c -Sensitive sliding scale insulin ACHS  Hypothyroidism -Continue Synthroid  GERD -Continue Protonix  Chronic blood loss anemia/iron deficiency anemia Due to chronic blood loss from AVMs and GI advised against repeat endoscopic procedures given his age.  He is followed by hematology and continues to be on Eliquis for A-fib.  Hemoglobin currently 10.7, was 11.4 on 03/17/2022. -Continue iron supplement -Continue to monitor hemoglobin  Physical deconditioning -PT/OT eval, fall precautions  DVT prophylaxis: Eliquis Code Status: DNR (discussed with the patient) Family Communication: No family available at this time. Level of care: Telemetry bed Admission status: It is my clinical opinion that admission to INPATIENT is reasonable and necessary because of the expectation that this patient will require hospital care that crosses at least 2 midnights to treat this condition based on the medical complexity of the problems presented.  Given the aforementioned information, the predictability of an adverse outcome is felt to be significant.   Shela Leff MD [facility]  If 7PM-7AM, please contact night-coverage [email or web address]  03/23/2022, 3:33 AM

## 2022-03-23 NOTE — ED Notes (Signed)
Patient oxygen 86-87% RA. Triage RN and PA notified. Patient placed on 2L. Oxygen improved to 89-90. Placed on 3L. Patient 92%.

## 2022-03-23 NOTE — Progress Notes (Addendum)
PROGRESS NOTE    [name]  CBS:[id number] DOB: 03-26-1932 DOA: 03/24/2022 PCP: [name], NP   Brief Narrative:  HPI: Ronnie Joseph is a 88 y.o. male with medical history significant of CAD status post PCI 05/2019, ischemic cardiomyopathy, paroxysmal A-fib on Eliquis, hypertension, hyperlipidemia, mild cognitive impairment, type 2 diabetes, hypothyroidism, OSA, chronic cough, GERD, iron deficiency anemia due to chronic blood loss from AVMs and GI advised against repeat endoscopic procedures given his age.  Recently seen in the ED on 6/6 for fevers, chills, and generalized weakness.  He was diagnosed with pneumonia (chest x-ray showing hazy right infrahilar opacity) and discharged on Augmentin and azithromycin.   He returns to the ED today complaining of generalized weakness, shortness of breath, and cough.  SPO2 86-87% on room air and placed on 3 L supplemental oxygen.  Afebrile and not tachycardic or hypotensive.  Labs showing WBC 13.1, hemoglobin 10.7 (was 11.4 on 03/17/2022), platelet count 467k.  Sodium 139, potassium 3.4, chloride 105, bicarb 23, BUN 16, creatinine 1.4 (stable), glucose 127.  Transaminases mildly elevated and improved compared to prior labs.  Alk phos and T. bili normal.  Lactate and blood cultures pending.  Chest x-ray showing findings consistent with multifocal pneumonia. Patient was given vancomycin and cefepime.   Patient reports 1 week history of generalized weakness, cough, shortness of breath, and poor appetite.  States he feels so weak that he is barely able to get up and walk.  States he was seen in the emergency room a few days ago and was diagnosed with pneumonia and prescribed antibiotics which she has been taking but he is not improving.  He does not use oxygen at home.  Denies nausea, vomiting, or abdominal pain.  He takes a laxative and his stool is loose.  Reports chronic bilateral lower extremity edema for which she takes furosemide as needed if he gains weight.   He is vaccinated against COVID.  He is a retired Software engineer.  Assessment & Plan:   Principal Problem:   Multifocal pneumonia Active Problems:   Acquired hypothyroidism   Atrial fibrillation ([place])   Sepsis ([place])   Hypercholesterolemia   Acute respiratory failure with hypoxia ([place])   Hypokalemia  Severe sepsis and acute hypoxic respiratory failure secondary to multifocal community-acquired pneumonia: Patient met severe sepsis criteria based on tachypnea and leukocytosis as well as acute hypoxia.  Evidence of pneumonia on the imaging study.  He failed outpatient antibiotic therapy.  Urine antigen for Legionella negative.  Lactic acid normal.  COVID pending.  Will check procalcitonin.  MRSA by nares is also pending.  We will continue cefepime and vancomycin and follow progress and cultures.  Encouraged incentive spirometry.  Try to wean oxygen.  Mild hypokalemia: Replaced and resolved.  CAD s/p PCI/hyperlipidemia: No ACS symptoms.  Continue statin and aspirin.  Chronic combined ischemic cardiomyopathy/CHF: Echo done October 2022 showing LVEF 40 to 84%, grade 1 diastolic dysfunction.  He has 2+ pitting edema bilateral lower extremities.  Takes Lasix 20 mg prn.  -Continue p.o. Lasix 20 mg daily  Paroxysmal atrial fibrillation: Currently in sinus rhythm.  Continue amiodarone and Eliquis.  History of essential hypertension: Blood pressure on the low side.  Continue to hold losartan.   Non-insulin-dependent type 2 diabetes mellitus: Resume Jardiance.  Continue SSI.  Acquired hypothyroidism: Continue Synthroid.  GERD: Continue PPI.  Chronic blood loss anemia/iron deficiency anemia: Due to chronic blood loss from AVMs and GI advised against repeat endoscopic procedures given his age.  He  is followed by hematology and continues to be on Eliquis for A-fib.  Hemoglobin currently 10.7, was 11.4 on 03/17/2022. -Continue iron supplement -Continue to monitor hemoglobin  Generalized weakness: PT OT  consulted.  DVT prophylaxis: apixaban (ELIQUIS) tablet 2.5 mg Start: 03/23/22 1000   Code Status: DNR  Family Communication:  None present at bedside.  Plan of care discussed with patient in length and he/she verbalized understanding and agreed with it.  Status is: Inpatient Remains inpatient appropriate because: Patient septic and is still symptomatic.   Estimated body mass index is 25.85 kg/m as calculated from the following:   Height as of this encounter: $RemoveBeforeD'5\' 8"'[id number]$  (1.727 m).   Weight as of this encounter: 77.1 kg.    Nutritional Assessment: Body mass index is 25.85 kg/m.Marland Kitchen Seen by dietician.  I agree with the assessment and plan as outlined below: Nutrition Status:        . Skin Assessment: I have examined the patient's skin and I agree with the wound assessment as performed by the wound care RN as outlined below:    Consultants:  None  Procedures:  None  Antimicrobials:  Anti-infectives (From admission, onward)    Start     Dose/Rate Route Frequency Ordered Stop   03/24/22 0500  vancomycin (VANCOREADY) IVPB 750 mg/150 mL        750 mg 150 mL/hr over 60 Minutes Intravenous Every 24 hours 03/23/22 0515     03/23/22 1600  ceFEPIme (MAXIPIME) 2 g in sodium chloride 0.9 % 100 mL IVPB        2 g 200 mL/hr over 30 Minutes Intravenous Every 12 hours 03/23/22 0515     03/23/22 0315  ceFEPIme (MAXIPIME) 2 g in sodium chloride 0.9 % 100 mL IVPB        2 g 200 mL/hr over 30 Minutes Intravenous  Once 03/23/22 0307 03/23/22 0459   03/23/22 0315  vancomycin (VANCOREADY) IVPB 1500 mg/300 mL        1,500 mg 150 mL/hr over 120 Minutes Intravenous  Once 03/23/22 0307 03/23/22 0724         Subjective: Patient seen and examined in the ED.  He states that his breathing is slightly better but he still feels short of breath.  No other complaint.  Objective: Vitals:   03/23/22 0725 03/23/22 0800 03/23/22 0900 03/23/22 1000  BP: (!) 115/50 (!) 107/54 (!) 105/48 90/78  Pulse: 70  77 71 71  Resp: (!) 30 (!) 34 (!) 30 (!) 24  Temp:      TempSrc:      SpO2: 98% 95% 96% 96%  Weight:      Height:        Intake/Output Summary (Last 24 hours) at 03/23/2022 1027 Last data filed at 03/23/2022 0724 Gross per 24 hour  Intake 300 ml  Output --  Net 300 ml   Filed Weights   03/24/2022 1834  Weight: 77.1 kg    Examination:  General exam: Appears calm but sick Respiratory system: Diffuse rhonchi bilaterally. Respiratory effort normal. Cardiovascular system: S1 & S2 heard, RRR. No JVD, murmurs, rubs, gallops or clicks. No pedal edema. Gastrointestinal system: Abdomen is nondistended, soft and nontender. No organomegaly or masses felt. Normal bowel sounds heard. Central nervous system: Alert and oriented. No focal neurological deficits. Extremities: Symmetric 5 x 5 power. Skin: No rashes, lesions or ulcers Psychiatry: Judgement and insight appear normal. Mood & affect appropriate.    Data Reviewed: I have personally reviewed following labs  and imaging studies  CBC: Recent Labs  Lab 03/17/22 0858 03/12/2022 1903 03/23/22 0624  WBC 10.3 13.1* 12.8*  NEUTROABS 7.5 10.3*  --   HGB 11.4* 10.7* 12.1*  HCT 33.5* 32.3* 35.9*  MCV 103.4* 104.9* 103.5*  PLT 297 467* 993*   Basic Metabolic Panel: Recent Labs  Lab 03/17/22 0858 03/23/2022 1903 03/23/22 0624  NA 140 139 139  K 3.4* 3.4* 3.6  CL 108 105 107  CO2 24 23 21*  GLUCOSE 106* 127* 110*  BUN 26* 16 15  CREATININE 1.43* 1.42* 1.33*  CALCIUM 8.5* 8.2* 8.0*  MG  --   --  2.5*   GFR: Estimated Creatinine Clearance: 37.1 mL/min (A) (by C-G formula based on SCr of 1.33 mg/dL (H)). Liver Function Tests: Recent Labs  Lab 03/17/22 0858 03/20/2022 1903  AST 78* 55*  ALT 74* 54*  ALKPHOS 44 53  BILITOT 0.7 0.8  PROT 6.1* 5.9*  ALBUMIN 2.8* 2.6*   No results for input(s): "LIPASE", "AMYLASE" in the last 168 hours. No results for input(s): "AMMONIA" in the last 168 hours. Coagulation Profile: No results  for input(s): "INR", "PROTIME" in the last 168 hours. Cardiac Enzymes: No results for input(s): "CKTOTAL", "CKMB", "CKMBINDEX", "TROPONINI" in the last 168 hours. BNP (last 3 results) No results for input(s): "PROBNP" in the last 8760 hours. HbA1C: Recent Labs    03/23/22 0624  HGBA1C 5.8*   CBG: Recent Labs  Lab 03/23/22 0839  GLUCAP 118*   Lipid Profile: No results for input(s): "CHOL", "HDL", "LDLCALC", "TRIG", "CHOLHDL", "LDLDIRECT" in the last 72 hours. Thyroid Function Tests: No results for input(s): "TSH", "T4TOTAL", "FREET4", "T3FREE", "THYROIDAB" in the last 72 hours. Anemia Panel: No results for input(s): "VITAMINB12", "FOLATE", "FERRITIN", "TIBC", "IRON", "RETICCTPCT" in the last 72 hours. Sepsis Labs: Recent Labs  Lab 03/17/22 1005 03/23/22 0349  LATICACIDVEN 1.1 1.7    Recent Results (from the past 240 hour(s))  Urine Culture     Status: Abnormal   Collection Time: 03/17/22  8:52 AM   Specimen: Urine, Clean Catch  Result Value Ref Range Status   Specimen Description URINE, CLEAN CATCH  Final   Special Requests NONE  Final   Culture (A)  Final    <10,000 COLONIES/mL INSIGNIFICANT GROWTH Performed at [facility], [place] [address] [place], [place] [address]    Report Status 03/18/2022 FINAL  Final  Resp Panel by RT-PCR (Flu A&B, Covid) Anterior Nasal Swab     Status: None   Collection Time: 03/17/22  8:53 AM   Specimen: Anterior Nasal Swab  Result Value Ref Range Status   SARS Coronavirus 2 by RT PCR NEGATIVE NEGATIVE Final    Comment: (NOTE) SARS-CoV-2 target nucleic acids are NOT DETECTED.  The SARS-CoV-2 RNA is generally detectable in upper respiratory specimens during the acute phase of infection. The lowest concentration of SARS-CoV-2 viral copies this assay can detect is 138 copies/mL. A negative result does not preclude SARS-Cov-2 infection and should not be used as the sole basis for treatment or other patient management decisions. A  negative result may occur with  improper specimen collection/handling, submission of specimen other than nasopharyngeal swab, presence of viral mutation(s) within the areas targeted by this assay, and inadequate number of viral copies(<138 copies/mL). A negative result must be combined with clinical observations, patient history, and epidemiological information. The expected result is Negative.  Fact Sheet for Patients:  EntrepreneurPulse.com.au  Fact Sheet for Healthcare Providers:  IncredibleEmployment.be  This test is  no t yet approved or cleared by the [place] and  has been authorized for detection and/or diagnosis of SARS-CoV-2 by FDA under an Emergency Use Authorization (EUA). This EUA will remain  in effect (meaning this test can be used) for the duration of the COVID-19 declaration under Section 564(b)(1) of the Act, 21 U.S.C.section 360bbb-3(b)(1), unless the authorization is terminated  or revoked sooner.       Influenza A by PCR NEGATIVE NEGATIVE Final   Influenza B by PCR NEGATIVE NEGATIVE Final    Comment: (NOTE) The Xpert Xpress SARS-CoV-2/FLU/RSV plus assay is intended as an aid in the diagnosis of influenza from Nasopharyngeal swab specimens and should not be used as a sole basis for treatment. Nasal washings and aspirates are unacceptable for Xpert Xpress SARS-CoV-2/FLU/RSV testing.  Fact Sheet for Patients: EntrepreneurPulse.com.au  Fact Sheet for Healthcare Providers: IncredibleEmployment.be  This test is not yet approved or cleared by the [place] FDA and has been authorized for detection and/or diagnosis of SARS-CoV-2 by FDA under an Emergency Use Authorization (EUA). This EUA will remain in effect (meaning this test can be used) for the duration of the COVID-19 declaration under Section 564(b)(1) of the Act, 21 U.S.C. section 360bbb-3(b)(1), unless the authorization  is terminated or revoked.  Performed at [facility], [place] [address] [place], [place] [address]      Radiology Studies: DG Chest 2 View  Result Date: 03/27/2022 CLINICAL DATA:  Shortness of breath, pneumonia EXAM: CHEST - 2 VIEW COMPARISON:  03/17/2022 FINDINGS: Cardiomegaly. New, heterogeneous airspace opacity most conspicuously present in the right upper lobe although likely bilateral. Small bilateral pleural effusions. Disc degenerative disease of the thoracic spine. IMPRESSION: 1. New, heterogeneous airspace opacity most conspicuously present in the right upper lobe although likely bilateral. Small bilateral pleural effusions. Findings are consistent with multifocal infection. 2. Cardiomegaly. Electronically Signed   By: Delanna Ahmadi M.D.   On: 04/04/2022 19:27    Scheduled Meds:  amiodarone  200 mg Oral Daily   apixaban  2.5 mg Oral BID   aspirin  81 mg Oral Daily   empagliflozin  10 mg Oral QAC breakfast   ferrous sulfate  325 mg Oral Q breakfast   furosemide  20 mg Oral Daily   insulin aspart  0-5 Units Subcutaneous QHS   insulin aspart  0-9 Units Subcutaneous TID WC   levothyroxine  88 mcg Oral Q0600   loratadine  10 mg Oral Daily   montelukast  10 mg Oral Daily   pantoprazole  40 mg Oral Daily   rosuvastatin  20 mg Oral Daily   Continuous Infusions:  ceFEPime (MAXIPIME) IV     [START ON 03/24/2022] vancomycin       LOS: 0 days   Darliss Cheney, MD [facility]  03/23/2022, 10:27 AM   *Please note that this is a verbal dictation therefore any spelling or grammatical errors are due to the "[place]" system interpretation.  Please page via [place] and do not message via secure chat for urgent patient care matters. Secure chat can be used for non urgent patient care matters.  How to contact the [facility] Attending or Consulting provider [place] or covering provider during after hours [place], for this patient?  Check the care team in [facility] and look for a)  attending/consulting TR[facility] provider listed and b) the [facility] team listed. Page or secure chat [phone]. Log into [email or web address] and use [place]'s universal password to access. If  you do not have the password, please contact the hospital operator. Locate the [facility] provider you are looking for under [facility] and page to a number that you can be directly reached. If you still have difficulty reaching the provider, please page the [facility] (Director on Call) for the Hospitalists listed on amion for assistance.

## 2022-03-24 ENCOUNTER — Ambulatory Visit: Payer: Medicare Other | Admitting: Nurse Practitioner

## 2022-03-24 ENCOUNTER — Inpatient Hospital Stay (HOSPITAL_COMMUNITY): Payer: Medicare Other

## 2022-03-24 DIAGNOSIS — J9601 Acute respiratory failure with hypoxia: Secondary | ICD-10-CM

## 2022-03-24 DIAGNOSIS — J189 Pneumonia, unspecified organism: Secondary | ICD-10-CM | POA: Diagnosis not present

## 2022-03-24 LAB — CBC WITH DIFFERENTIAL/PLATELET
Abs Immature Granulocytes: 0.17 10*3/uL — ABNORMAL HIGH (ref 0.00–0.07)
Basophils Absolute: 0.1 10*3/uL (ref 0.0–0.1)
Basophils Relative: 0 %
Eosinophils Absolute: 0.1 10*3/uL (ref 0.0–0.5)
Eosinophils Relative: 0 %
HCT: 31.4 % — ABNORMAL LOW (ref 39.0–52.0)
Hemoglobin: 10.4 g/dL — ABNORMAL LOW (ref 13.0–17.0)
Immature Granulocytes: 1 %
Lymphocytes Relative: 3 %
Lymphs Abs: 0.5 10*3/uL — ABNORMAL LOW (ref 0.7–4.0)
MCH: 34.2 pg — ABNORMAL HIGH (ref 26.0–34.0)
MCHC: 33.1 g/dL (ref 30.0–36.0)
MCV: 103.3 fL — ABNORMAL HIGH (ref 80.0–100.0)
Monocytes Absolute: 1.5 10*3/uL — ABNORMAL HIGH (ref 0.1–1.0)
Monocytes Relative: 9 %
Neutro Abs: 15 10*3/uL — ABNORMAL HIGH (ref 1.7–7.7)
Neutrophils Relative %: 87 %
Platelets: 511 10*3/uL — ABNORMAL HIGH (ref 150–400)
RBC: 3.04 MIL/uL — ABNORMAL LOW (ref 4.22–5.81)
RDW: 13.6 % (ref 11.5–15.5)
WBC: 17.3 10*3/uL — ABNORMAL HIGH (ref 4.0–10.5)
nRBC: 0 % (ref 0.0–0.2)

## 2022-03-24 LAB — BLOOD GAS, ARTERIAL
Acid-base deficit: 1.6 mmol/L (ref 0.0–2.0)
Bicarbonate: 21.7 mmol/L (ref 20.0–28.0)
Drawn by: 137461
O2 Saturation: 96.3 %
Patient temperature: 37
pCO2 arterial: 32 mmHg (ref 32–48)
pH, Arterial: 7.44 (ref 7.35–7.45)
pO2, Arterial: 74 mmHg — ABNORMAL LOW (ref 83–108)

## 2022-03-24 LAB — BASIC METABOLIC PANEL
Anion gap: 9 (ref 5–15)
BUN: 16 mg/dL (ref 8–23)
CO2: 23 mmol/L (ref 22–32)
Calcium: 8.2 mg/dL — ABNORMAL LOW (ref 8.9–10.3)
Chloride: 108 mmol/L (ref 98–111)
Creatinine, Ser: 1.33 mg/dL — ABNORMAL HIGH (ref 0.61–1.24)
GFR, Estimated: 51 mL/min — ABNORMAL LOW (ref >60)
Glucose, Bld: 133 mg/dL — ABNORMAL HIGH (ref 70–99)
Potassium: 4.2 mmol/L (ref 3.5–5.1)
Sodium: 140 mmol/L (ref 135–145)

## 2022-03-24 LAB — LEGIONELLA PNEUMOPHILA SEROGP 1 UR AG: L. pneumophila Serogp 1 Ur Ag: NEGATIVE

## 2022-03-24 LAB — GLUCOSE, CAPILLARY
Glucose-Capillary: 117 mg/dL — ABNORMAL HIGH (ref 70–99)
Glucose-Capillary: 130 mg/dL — ABNORMAL HIGH (ref 70–99)
Glucose-Capillary: 140 mg/dL — ABNORMAL HIGH (ref 70–99)
Glucose-Capillary: 80 mg/dL (ref 70–99)

## 2022-03-24 LAB — BRAIN NATRIURETIC PEPTIDE: B Natriuretic Peptide: 1956.5 pg/mL — ABNORMAL HIGH (ref 0.0–100.0)

## 2022-03-24 LAB — PROCALCITONIN: Procalcitonin: 0.19 ng/mL

## 2022-03-24 MED ORDER — FUROSEMIDE 10 MG/ML IJ SOLN
40.0000 mg | Freq: Two times a day (BID) | INTRAMUSCULAR | Status: DC
Start: 2022-03-24 — End: 2022-03-25
  Administered 2022-03-24 – 2022-03-25 (×3): 40 mg via INTRAVENOUS
  Filled 2022-03-24 (×3): qty 4

## 2022-03-24 MED ORDER — ALBUTEROL SULFATE (2.5 MG/3ML) 0.083% IN NEBU
INHALATION_SOLUTION | RESPIRATORY_TRACT | Status: AC
  Filled 2022-03-24: qty 3

## 2022-03-24 MED ORDER — FUROSEMIDE 10 MG/ML IJ SOLN
40.0000 mg | Freq: Once | INTRAMUSCULAR | Status: AC
  Administered 2022-03-24: 40 mg via INTRAVENOUS
  Filled 2022-03-24: qty 4

## 2022-03-24 MED ORDER — ALBUTEROL SULFATE (2.5 MG/3ML) 0.083% IN NEBU
2.5000 mg | INHALATION_SOLUTION | RESPIRATORY_TRACT | Status: DC | PRN
  Administered 2022-03-24 – 2022-03-30 (×2): 2.5 mg via RESPIRATORY_TRACT
  Filled 2022-03-24: qty 3

## 2022-03-24 NOTE — Progress Notes (Addendum)
TRH night coverage note: S: pt seen and evaluated at bedside, pt with respiratory distress / significantly increased O2 requirement this evening, slightly improved after breathing treatment and increasing O2 to 12L via [place].  Pt denies CP.  Says his breathing is better than it was earlier (despite looking quite poor objectively to me).  Repeat CXR obtained.  O: Pt still with increased respiratory rate and Satting low 90s on 12L. Has accessory muscle use still.  Poor air movement with faint crackles on R side.  Faint crackles on L side as well.  Does have cardiac history.  Procalcitonin interestingly is negative this admit.  COVID also negative.  BP 562B systolic.  S.Tach at 110.  LBBB on monitor (old).  A: Worsening Hypoxia from PNA vs cardiac pulm edema.  Creat 1.3  P: 1) going to give '40mg'$  IV lasix x1 a try to see if this provides any benefit to his breathing 2) ordering BIPAP PRN 3) transferring to Progressive 4) if not improved with lasix, try empiric steroids, high res CT chest, and likely consult pulm.  Wondering if issue may be a bit more chronic than an acute PNA today.  Note findings on CT chest in March and Pulm consult note from end of May.  ?Undiagnosed autoimmune issue? 5) added BNP to AM labs already ordered  CRITICAL CARE Performed by: Etta Quill.   Total critical care time: 30 minutes  Critical care time was exclusive of separately billable procedures and treating other patients.  Critical care was necessary to treat or prevent imminent or life-threatening deterioration.  Critical care was time spent personally by me on the following activities: development of treatment plan with patient and/or surrogate as well as nursing, discussions with consultants, evaluation of patient's response to treatment, examination of patient, obtaining history from patient or surrogate, ordering and performing treatments and interventions, ordering and review of laboratory  studies, ordering and review of radiographic studies, pulse oximetry and re-evaluation of patient's condition. ca

## 2022-03-24 NOTE — Evaluation (Signed)
Physical Therapy Evaluation Patient Details Name: Ronnie Joseph MRN: [id number] DOB: 1932/05/26 Today's Date: 03/24/2022  History of Present Illness  88 y.o. male presented to ED 03/23/22 with generalized weakness, shortness of breath, and cough. +multifocal pneumonia with sepsis; PMH significant of CAD, ischemic cardiomyopathy, paroxysmal A-fib on Eliquis, hypertension, hyperlipidemia, mild cognitive impairment, type 2 diabetes, anemia due to chronic blood loss from AVMs  Clinical Impression   Pt admitted secondary to problem above with deficits below. PTA patient was living alone in an ALF. He uses a rollator at all times. Pt currently requires min assist to sit at EOB and refused to attempt standing due to fatigue and feeling "wheezy." Educated on benefits of activity and risks of inactivity and he was adamant that he could not try to stand or get to recliner. May need to consider SNF if he continues to avoid activity and requires more assist. Anticipate patient will benefit from PT to address problems listed below.Will continue to follow acutely to maximize functional mobility independence and safety.          Recommendations for follow up therapy are one component of a multi-disciplinary discharge planning process, led by the attending physician.  Recommendations may be updated based on patient status, additional functional criteria and insurance authorization.  Follow Up Recommendations Home health PT (if progresses appropriately; may decline further and need SNF)    Assistance Recommended at Discharge Intermittent Supervision/Assistance  Patient can return home with the following  A lot of help with walking and/or transfers;A lot of help with bathing/dressing/bathroom;Assistance with cooking/housework;Assist for transportation;Help with stairs or ramp for entrance    Equipment Recommendations None recommended by PT  Recommendations for Other Services  OT consult    Functional Status  Assessment Patient has had a recent decline in their functional status and demonstrates the ability to make significant improvements in function in a reasonable and predictable amount of time.     Precautions / Restrictions Precautions Precautions: Fall      Mobility  Bed Mobility Overal bed mobility: Needs Assistance Bed Mobility: Supine to Sit, Sit to Supine     Supine to sit: Min assist, HOB elevated Sit to supine: Supervision   General bed mobility comments: assist to raise torso to sit    Transfers                   General transfer comment: pt refused due to feeling too fatigued and "wheezy"    Ambulation/Gait                  Stairs            Wheelchair Mobility    Modified Rankin (Stroke Patients Only)       Balance Overall balance assessment: Needs assistance Sitting-balance support: Bilateral upper extremity supported, Feet supported Sitting balance-Leahy Scale: Fair Sitting balance - Comments: required UE support due to kyphosis                                     Pertinent Vitals/Pain Pain Assessment Pain Assessment: No/denies pain    Home Living Family/patient expects to be discharged to:: Assisted living                 Home Equipment: Rollator (4 wheels);Other (comment);Shower seat - built in;Grab bars - tub/shower;Grab bars - toilet (bed rail) Additional Comments: Venda Rodes at [facility]  Function               Mobility Comments: uses rollator at all times ADLs Comments: daughter is there when he takes a shower; dresses on his own     Hand Dominance   Dominant Hand: Right    Extremity/Trunk Assessment   Upper Extremity Assessment Upper Extremity Assessment: Generalized weakness    Lower Extremity Assessment Lower Extremity Assessment: Generalized weakness    Cervical / Trunk Assessment Cervical / Trunk Assessment: Kyphotic  Communication   Communication: No  difficulties  Cognition Arousal/Alertness: Awake/alert Behavior During Therapy: WFL for tasks assessed/performed Overall Cognitive Status: Within Functional Limits for tasks assessed                                          General Comments General comments (skin integrity, edema, etc.): on 10L HFNC with sats 89-97% (lower when sitting EOB and higher on return to supine) HR 70s    Exercises     Assessment/Plan    PT Assessment Patient needs continued PT services  PT Problem List Decreased strength;Decreased activity tolerance;Decreased balance;Decreased mobility;Decreased knowledge of use of DME;Cardiopulmonary status limiting activity       PT Treatment Interventions DME instruction;Gait training;Functional mobility training;Therapeutic activities;Therapeutic exercise;Balance training;Patient/family education    PT Goals (Current goals can be found in the Care Plan section)  Acute Rehab PT Goals Patient Stated Goal: feel better PT Goal Formulation: With patient Time For Goal Achievement: 04/07/22 Potential to Achieve Goals: Good    Frequency Min 3X/week     Co-evaluation               AM-PAC PT "6 Clicks" Mobility  Outcome Measure Help needed turning from your back to your side while in a flat bed without using bedrails?: A Little Help needed moving from lying on your back to sitting on the side of a flat bed without using bedrails?: A Little Help needed moving to and from a bed to a chair (including a wheelchair)?: A Lot Help needed standing up from a chair using your arms (e.g., wheelchair or bedside chair)?: A Lot Help needed to walk in hospital room?: Total Help needed climbing 3-5 steps with a railing? : Total 6 Click Score: 12    End of Session Equipment Utilized During Treatment: Oxygen Activity Tolerance: Patient limited by fatigue Patient left: in bed;with call bell/phone within reach;with bed alarm set Nurse Communication: Mobility  status PT Visit Diagnosis: Difficulty in walking, not elsewhere classified (R26.2);Muscle weakness (generalized) (M62.81)    Time: 5830-9407 PT Time Calculation (min) (ACUTE ONLY): 36 min   Charges:   PT Evaluation $PT Eval Moderate Complexity: 1 Mod PT Treatments $Therapeutic Activity: 8-22 mins         Arby Barrette, PT Acute Rehabilitation Services  Office [phone]   Rexanne Mano 03/24/2022, 11:37 AM

## 2022-03-24 NOTE — Progress Notes (Signed)
New Admission Note:   Arrival Method:  stretcher Mental Orientation: alert x 4 Telemetry: box 19 Assessment: Completed Skin: see flowsheet EE:ATVVL Hand Pain: none Tubes: condom cath Safety Measures: Safety Fall Prevention Plan has been discussed Admission: Completed [facility] Orientation: Patient has been orientated to the room, unit and staff.  Family: none at side  Orders have been reviewed and implemented. Will continue to monitor the patient. Call light has been placed within reach and bed alarm has been activated.   Rockie Neighbours BSN, RN Phone number: [phone]

## 2022-03-24 NOTE — Progress Notes (Signed)
Pt transferred to 5W29 receiving nurse Matt RN,pt VSS no signs of distress,pt on o2 at 12L sats 94% [place].via bed.

## 2022-03-24 NOTE — Significant Event (Signed)
Rapid Response Event Note   Reason for Call :  Pt c/o SOB with increased O2 requirements from 3L [place] to 12L HFNC  Initial Focused Assessment:  On arrival, pt with labored breathing with accessory muscle use, RR 42, spO2 88% on 12L HFNC. Lungs diminished with not much airflow especially on R side. spO2 improved to 94% but work of breathing still increased.   Md to bedside, plan of care discussed   Interventions:  Neb treatment prior to my arrival (0037) CXR- increased vascular congestion BNP, Lasix, Bipap PRN Transfer to progressive floor   Plan of Care:  Transfer to PCU for closer monitoring and Bipap as needed.    Event Summary:   MD Notified: prior to my arrival Call Time: [place] Time: 0117 End Time: 0200  Sherilyn Dacosta, RN

## 2022-03-24 NOTE — Progress Notes (Signed)
Pt states he does not wish to be placed on cpap/bipap.  Pt in no distress at this time on [place].  Pt aware to that he can request device if he changes his mind.  RT will cont to monitor.

## 2022-03-24 NOTE — Evaluation (Signed)
Occupational Therapy Evaluation Patient Details Name: Ronnie Joseph MRN: [id number] DOB: [birth year] Today's Date: 03/24/2022   History of Present Illness 88 y.o. male presented to ED 03/23/22 with generalized weakness, shortness of breath, and cough. 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 Pt uses rollator for mobility, and does not require O2 at baseline. At time of OT evaluation, pt requiring min A for bed mobility, mod-max A for ADLs, and max A+2 for sit to stand transfer attempt. Pt unable to clear hips from bed for standing with x2 attempts from elevated surface with RN assisting on pt's backside. Pt with posterior lean during standing attempt. Pt presenting with impairments listed below, will follow acutely. Recommend HHOT at d/c, pending progress, if unable to progress mobility will need SNF.      Recommendations for follow up therapy are one component of a multi-disciplinary discharge planning process, led by the attending physician.  Recommendations may be updated based on patient status, additional functional criteria and insurance authorization.   Follow Up Recommendations  Home health OT (may need SNF pending progression)    Assistance Recommended at Discharge Intermittent Supervision/Assistance  Patient can return home with the following Two people to help with walking and/or transfers;A lot of help with bathing/dressing/bathroom;Assistance with cooking/housework;Direct supervision/assist for medications management;Direct supervision/assist for financial management;Assist for transportation;Help with stairs or ramp for entrance     Functional Status Assessment  Patient has had a recent decline in their functional status and demonstrates the ability to make significant improvements in function in a reasonable and predictable amount of time.  Equipment Recommendations  None recommended by OT    Recommendations for Other Services PT consult     Precautions / Restrictions Precautions Precautions: Fall Restrictions Weight Bearing Restrictions: No      Mobility Bed Mobility Overal bed mobility: Needs Assistance Bed Mobility: Supine to Sit, Sit to Supine     Supine to sit: Min assist, HOB elevated Sit to supine: Min assist   General bed mobility comments: for trunk elevation/to bring BLE's into bed    Transfers Overall transfer level: Needs assistance Equipment used: Rolling walker (2 wheels) Transfers: Sit to/from Stand Sit to Stand: Max assist, +2 physical assistance           General transfer comment: pt unable to achieve lift hips off bed from elevated bed height, will need +2 assist.      Balance Overall balance assessment: Needs assistance Sitting-balance support: Bilateral upper extremity supported, Feet supported Sitting balance-Leahy Scale: Fair Sitting balance - Comments: can reach towards feet to pull up socks                                   ADL either performed or assessed with clinical judgement   ADL Overall ADL's : Needs assistance/impaired Eating/Feeding: Modified independent;Sitting;Bed level   Grooming: Modified independent;Bed level;Standing   Upper Body Bathing: Moderate assistance;Sitting   Lower Body Bathing: Moderate assistance;Sitting/lateral leans;Sit to/from stand   Upper Body Dressing : Moderate assistance;Bed level Upper Body Dressing Details (indicate cue type and reason): to don gown Lower Body Dressing: Moderate assistance;Sitting/lateral leans;Sit to/from stand Lower  Body Dressing Details (indicate cue type and reason): to pull up  socks Toilet Transfer: Maximal assistance;+2 for physical assistance;Stand-pivot;BSC/3in1   Toileting- Clothing Manipulation and Hygiene: Maximal assistance;Sitting/lateral lean;Bed level       Functional mobility during ADLs: Maximal assistance;Rolling walker (2 wheels);+2 for physical assistance       Vision   Vision Assessment?: No apparent visual deficits     Perception     Praxis      Pertinent Vitals/Pain Pain Assessment Pain Assessment: No/denies pain     Hand Dominance Right   Extremity/Trunk Assessment Upper Extremity Assessment Upper Extremity Assessment: Generalized weakness (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, crepitus noted with BUE movement)   Lower Extremity Assessment Lower Extremity Assessment: Defer to PT evaluation   Cervical / Trunk Assessment Cervical / Trunk Assessment: Kyphotic   Communication Communication Communication: No difficulties   Cognition Arousal/Alertness: Awake/alert Behavior During Therapy: WFL for tasks assessed/performed Overall Cognitive Status: Within Functional Limits for tasks assessed                                       General Comments  SpO2 dropping to low 80's on 10L HFNC sitting EOB and performing ROM/MMT    Exercises     Shoulder Instructions      Home Living Family/patient expects to be discharged to:: Assisted living                             Home Equipment: Rollator (4 wheels);Shower seat - built in;Grab bars - tub/shower;Grab bars - toilet   Additional Comments: Venda Rodes at [facility]      Prior Functioning/Environment Prior Level of Function : Needs assist             Mobility Comments: uses rollator at all times ADLs Comments: daughter is there when he takes a shower; dresses on his own        OT Problem List: Decreased strength;Decreased range of motion;Decreased activity tolerance;Cardiopulmonary status limiting activity;Impaired balance (sitting  and/or standing)      OT Treatment/Interventions: Self-care/ADL training;Therapeutic exercise;Energy conservation;DME and/or AE instruction;Therapeutic activities;Patient/family education;Balance training    OT Goals(Current goals can be found in the care plan section) Acute Rehab OT Goals Patient Stated Goal: to get better OT Goal Formulation: With patient Time For Goal Achievement: 04/07/22 Potential to Achieve Goals: Good ADL Goals Pt Will Perform Upper Body Dressing: with supervision;sitting;standing Pt Will Perform Lower Body Dressing: with supervision;sitting/lateral leans;sit to/from stand Pt Will Transfer to Toilet: with mod assist;squat pivot transfer;regular height toilet;ambulating Additional ADL Goal #1: pt will complete bed mobility with supervision in prep for ADLs  OT Frequency: Min 2X/week    Co-evaluation              AM-PAC OT "6 Clicks" Daily Activity     Outcome Measure Help from another person eating meals?: None Help from another person taking care of personal grooming?: A Little Help from another person toileting, which includes using toliet, bedpan, or urinal?: A Lot Help from another person bathing (including washing, rinsing, drying)?: A Lot Help from another person to put on and taking off regular upper body clothing?: A Lot Help from another person to put on and taking off regular lower body clothing?: A Lot 6 Click Score: 15   End of Session Equipment Utilized During Treatment: Gait belt;Rolling walker (2 wheels)  Nurse Communication: Mobility status  Activity Tolerance: Patient tolerated treatment well Patient left: in bed;with call bell/phone within reach;with bed alarm set  OT Visit Diagnosis: Unsteadiness on feet (R26.81);Other abnormalities of gait and mobility (R26.89);Muscle weakness (generalized) (M62.81)                Time: 5726-2035 OT Time Calculation (min): 21 min Charges:  OT General Charges $OT Visit: 1 Visit OT Evaluation $OT  Eval Moderate Complexity: 1 [address] OTD, OTR/L Acute Rehab [phone]) 832 - [place] 03/24/2022, 3:46 PM

## 2022-03-24 NOTE — Progress Notes (Signed)
RT Note:  Called to assess patient for SOB and desaturation. Patient currently on 6L [place], tachycardiac, tachpneic, diaphoretic, exp wheezing. Albuterol nebulizer given, placed on HFNC at 12L to maintain O2 saturation >92% (goal). Patient states he feels somewhat better. RT will continue to monitor.

## 2022-03-24 NOTE — Progress Notes (Signed)
PT Cancellation Note  Patient Details Name: Ronnie Joseph MRN: [id number] DOB: [birth year]   Cancelled Treatment:    Reason Eval/Treat Not Completed: Other (comment)  RN just decreased O2 to 10L and pt would like to eat breakfast prior to activity. Patient agreeable to PT returning after breakfast for OOB.   Arby Barrette, PT Acute Rehabilitation Services  Office [phone]  Rexanne Mano 03/24/2022, 9:27 AM

## 2022-03-24 NOTE — Progress Notes (Signed)
Pt woke up yelling and c/o SOB pt was on 3lit of oxygen, pt vital signs in flowsheet, pt has no c/o chest pain,sweating profusley ,cbg 140,respiratory and rapid response notified,DR Fabio Neighbors notified

## 2022-03-24 NOTE — Progress Notes (Addendum)
PROGRESS NOTE    Ronnie Joseph  LKJ:[id number] DOB: May 02, 1932 DOA: 03/13/2022 PCP: [name], NP   Brief Narrative:  Ronnie Joseph is a 88 y.o. male with medical history significant of CAD status post PCI 05/2019, ischemic cardiomyopathy, paroxysmal A-fib on Eliquis, hypertension, hyperlipidemia, mild cognitive impairment, type 2 diabetes, hypothyroidism, OSA, chronic cough, GERD, iron deficiency anemia due to chronic blood loss from AVMs and GI advised against repeat endoscopic procedures given his age. Recently seen in the ED on 6/6 for fevers, chills, and generalized weakness.  He was diagnosed with pneumonia (chest x-ray showing hazy right infrahilar opacity) and discharged on Augmentin and azithromycin.   He returned to the ED complaining of generalized weakness, shortness of breath, and cough.  SPO2 86-87% on room air and placed on 3 L supplemental oxygen.  Afebrile and not tachycardic or hypotensive.  Chest x-ray showing findings consistent with multifocal pneumonia. Patient was given vancomycin and cefepime.   Patient reports 1 week history of generalized weakness, cough, shortness of breath, and poor appetite.  States he feels so weak that he is barely able to get up and walk.  He does not use oxygen at home.  Reports chronic bilateral lower extremity edema for which she takes furosemide as needed if he gains weight.  He is vaccinated against COVID.  He is a retired Software engineer.  Assessment & Plan:   Principal Problem:   Multifocal pneumonia Active Problems:   Acquired hypothyroidism   Atrial fibrillation ([place])   Severe sepsis (Uncertain)   Hypercholesterolemia   Acute respiratory failure with hypoxia (HCC)   Hypokalemia  Severe sepsis and acute hypoxic respiratory failure secondary to multifocal community-acquired pneumonia: Patient met severe sepsis criteria based on tachypnea and leukocytosis as well as acute hypoxia.  Evidence of pneumonia on the imaging study.  He failed outpatient  antibiotic therapy.  Urine antigen for Legionella but Streptococcus negative.  Lactic acid normal.  COVID negative.  Procalcitonin unremarkable.  MRSA by nares is also pending.  MRSA nares negative, will discontinue vancomycin but continue cefepime.  Acute on chronic combined systolic and diastolic congestive heart failure: Early morning of 03/24/2022, patient was noted to have worsening respiratory distress and requiring more oxygen.  He was seen by night hospitalist.  Repeat chest x-ray was done which shows worsening chest x-ray with infiltrates and possible pulmonary edema.  BNP was also checked which was elevated.  He was given 1 dose of IV Lasix.  I repeated ABG this morning which appears to be stable.  Rechecking procalcitonin.  Based on this, it appears that patient may be having acute on chronic CHF as well.  I will start him on Lasix 40 mg IV twice daily with a strict I's and O's and low-sodium diet and daily weight.  He may have some component of pneumonitis.  If he does not improve with diuretics, may need to consider to start steroids.  Mild hypokalemia: Replaced and resolved.  CAD s/p PCI/hyperlipidemia: No ACS symptoms.  Continue statin and aspirin.  Paroxysmal atrial fibrillation: Currently in sinus rhythm.  Continue amiodarone and Eliquis.  History of essential hypertension: Blood pressure on the low side.  Continue to hold losartan.   CKD stage IIIa: At baseline.  Non-insulin-dependent type 2 diabetes mellitus: Resumed Jardiance.  Continue SSI.  Blood sugar controlled.  Acquired hypothyroidism: Continue Synthroid.  GERD: Continue PPI.  Chronic blood loss anemia/iron deficiency anemia: Due to chronic blood loss from AVMs and GI advised against repeat endoscopic procedures given his  age.  He is followed by hematology and continues to be on Eliquis for A-fib.  Hemoglobin currently 10.7, was 11.4 on 03/17/2022. -Continue iron supplement -Continue to monitor hemoglobin  Generalized  weakness: PT OT consulted.  DVT prophylaxis: apixaban (ELIQUIS) tablet 2.5 mg Start: 03/23/22 1000   Code Status: DNR  Family Communication: Daughter present at bedside.  Plan of care discussed with patient in length and he/she verbalized understanding and agreed with it.  Status is: Inpatient Remains inpatient appropriate because: Patient sick with significant hypoxia  Estimated body mass index is 25.85 kg/m as calculated from the following:   Height as of this encounter: _0  (1.727 m).   Weight as of this encounter: 77.1 kg.    Nutritional Assessment: Body mass index is 25.85 kg/m.Marland Kitchen Seen by dietician.  I agree with the assessment and plan as outlined below: Nutrition Status:        . Skin Assessment: I have examined the patient's skin and I agree with the wound assessment as performed by the wound care RN as outlined below:    Consultants:  None  Procedures:  None  Antimicrobials:  Anti-infectives (From admission, onward)    Start     Dose/Rate Route Frequency Ordered Stop   03/24/22 0500  vancomycin (VANCOREADY) IVPB 750 mg/150 mL        750 mg 150 mL/hr over 60 Minutes Intravenous Every 24 hours 03/23/22 0515     03/23/22 1600  ceFEPIme (MAXIPIME) 2 g in sodium chloride 0.9 % 100 mL IVPB        2 g 200 mL/hr over 30 Minutes Intravenous Every 12 hours 03/23/22 0515     03/23/22 0315  ceFEPIme (MAXIPIME) 2 g in sodium chloride 0.9 % 100 mL IVPB        2 g 200 mL/hr over 30 Minutes Intravenous  Once 03/23/22 0307 03/23/22 0459   03/23/22 0315  vancomycin (VANCOREADY) IVPB 1500 mg/300 mL        1,500 mg 150 mL/hr over 120 Minutes Intravenous  Once 03/23/22 0307 03/23/22 0724         Subjective:  Patient seen and examined.  Daughter at the bedside.  Although he is on 10 L of oxygen, he appears very calm and comfortable and he states that his breathing is better and he feels better than how he felt early morning.  Daughter confirms that patient has never been  diagnosed with COPD and he had PFTs done by end of May which ruled out COPD as well.  He has chronic cough for which he saw pulmonology and after PFTs were normal, he was referred to ENT for possible sinusitis as a cause of chronic cough.  Objective: Vitals:   03/24/22 0039 03/24/22 0118 03/24/22 0122 03/24/22 0808  BP:   (!) 143/63 (!) 113/49  Pulse: (!) 104  97 77  Resp: (!) 40  (!) 42 20  Temp:    98.1 F (36.7 C)  TempSrc:   Oral Oral  SpO2: 94% (!) 88% 93% 90%  Weight:      Height:        Intake/Output Summary (Last 24 hours) at 03/24/2022 1048 Last data filed at 03/24/2022 0811 Gross per 24 hour  Intake 99 ml  Output 1150 ml  Net -1051 ml    Filed Weights   04/09/2022 1834  Weight: 77.1 kg    Examination:  General exam: Appears calm and comfortable  Respiratory system: Bilateral crackles and scattered rhonchi. Respiratory effort  normal. Cardiovascular system: S1 & S2 heard, RRR. No JVD, murmurs, rubs, gallops or clicks.  +1 pitting edema bilateral lower extremity Gastrointestinal system: Abdomen is nondistended, soft and nontender. No organomegaly or masses felt. Normal bowel sounds heard. Central nervous system: Alert and oriented. No focal neurological deficits. Extremities: Symmetric 5 x 5 power. Skin: No rashes, lesions or ulcers.  Psychiatry: Judgement and insight appear normal. Mood & affect appropriate.   Data Reviewed: I have personally reviewed following labs and imaging studies  CBC: Recent Labs  Lab 03/18/2022 1903 03/23/22 0624 03/24/22 0444  WBC 13.1* 12.8* 17.3*  NEUTROABS 10.3*  --  15.0*  HGB 10.7* 12.1* 10.4*  HCT 32.3* 35.9* 31.4*  MCV 104.9* 103.5* 103.3*  PLT 467* 440* 511*    Basic Metabolic Panel: Recent Labs  Lab 03/15/2022 1903 03/23/22 0624 03/24/22 0444  NA 139 139 140  K 3.4* 3.6 4.2  CL 105 107 108  CO2 23 21* 23  GLUCOSE 127* 110* 133*  BUN _0 CREATININE 1.42* 1.33* 1.33*  CALCIUM 8.2* 8.0* 8.2*  MG  --  2.5*  --      GFR: Estimated Creatinine Clearance: 37.1 mL/min (A) (by C-G formula based on SCr of 1.33 mg/dL (H)). Liver Function Tests: Recent Labs  Lab 03/12/2022 1903  AST 55*  ALT 54*  ALKPHOS 53  BILITOT 0.8  PROT 5.9*  ALBUMIN 2.6*    No results for input(s): "LIPASE", "AMYLASE" in the last 168 hours. No results for input(s): "AMMONIA" in the last 168 hours. Coagulation Profile: No results for input(s): "INR", "PROTIME" in the last 168 hours. Cardiac Enzymes: No results for input(s): "CKTOTAL", "CKMB", "CKMBINDEX", "TROPONINI" in the last 168 hours. BNP (last 3 results) No results for input(s): "PROBNP" in the last 8760 hours. HbA1C: Recent Labs    03/23/22 0624  HGBA1C 5.8*    CBG: Recent Labs  Lab 03/23/22 0839 03/23/22 1300 03/23/22 1709 03/23/22 2209 03/24/22 0040  GLUCAP 118* 93 112* 97 140*    Lipid Profile: No results for input(s): "CHOL", "HDL", "LDLCALC", "TRIG", "CHOLHDL", "LDLDIRECT" in the last 72 hours. Thyroid Function Tests: No results for input(s): "TSH", "T4TOTAL", "FREET4", "T3FREE", "THYROIDAB" in the last 72 hours. Anemia Panel: No results for input(s): "VITAMINB12", "FOLATE", "FERRITIN", "TIBC", "IRON", "RETICCTPCT" in the last 72 hours. Sepsis Labs: Recent Labs  Lab 03/23/22 0349 03/23/22 1121  PROCALCITON  --  <0.10  LATICACIDVEN 1.7  --      Recent Results (from the past 240 hour(s))  Urine Culture     Status: Abnormal   Collection Time: 03/17/22  8:52 AM   Specimen: Urine, Clean Catch  Result Value Ref Range Status   Specimen Description URINE, CLEAN CATCH  Final   Special Requests NONE  Final   Culture (A)  Final    <10,000 COLONIES/mL INSIGNIFICANT GROWTH Performed at [facility], [place] [address], [place], [place] [address]    Report Status 03/18/2022 FINAL  Final  Resp Panel by RT-PCR (Flu A&B, Covid) Anterior Nasal Swab     Status: None   Collection Time: 03/17/22  8:53 AM   Specimen: Anterior Nasal Swab  Result  Value Ref Range Status   SARS Coronavirus 2 by RT PCR NEGATIVE NEGATIVE Final    Comment: (NOTE) SARS-CoV-2 target nucleic acids are NOT DETECTED.  The SARS-CoV-2 RNA is generally detectable in upper respiratory specimens during the acute phase of infection. The lowest concentration of SARS-CoV-2 viral copies this assay can detect  is 138 copies/mL. A negative result does not preclude SARS-Cov-2 infection and should not be used as the sole basis for treatment or other patient management decisions. A negative result may occur with  improper specimen collection/handling, submission of specimen other than nasopharyngeal swab, presence of viral mutation(s) within the areas targeted by this assay, and inadequate number of viral copies(<138 copies/mL). A negative result must be combined with clinical observations, patient history, and epidemiological information. The expected result is Negative.  Fact Sheet for Patients:  EntrepreneurPulse.com.au  Fact Sheet for Healthcare Providers:  IncredibleEmployment.be  This test is no t yet approved or cleared by the [place] FDA and  has been authorized for detection and/or diagnosis of SARS-CoV-2 by FDA under an Emergency Use Authorization (EUA). This EUA will remain  in effect (meaning this test can be used) for the duration of the COVID-19 declaration under Section 564(b)(1) of the Act, 21 U.S.C.section 360bbb-3(b)(1), unless the authorization is terminated  or revoked sooner.       Influenza A by PCR NEGATIVE NEGATIVE Final   Influenza B by PCR NEGATIVE NEGATIVE Final    Comment: (NOTE) The Xpert Xpress SARS-CoV-2/FLU/RSV plus assay is intended as an aid in the diagnosis of influenza from Nasopharyngeal swab specimens and should not be used as a sole basis for treatment. Nasal washings and aspirates are unacceptable for Xpert Xpress SARS-CoV-2/FLU/RSV testing.  Fact Sheet for  Patients: EntrepreneurPulse.com.au  Fact Sheet for Healthcare Providers: IncredibleEmployment.be  This test is not yet approved or cleared by the [place] FDA and has been authorized for detection and/or diagnosis of SARS-CoV-2 by FDA under an Emergency Use Authorization (EUA). This EUA will remain in effect (meaning this test can be used) for the duration of the COVID-19 declaration under Section 564(b)(1) of the Act, 21 U.S.C. section 360bbb-3(b)(1), unless the authorization is terminated or revoked.  Performed at [facility], [place] [address], [place], [place] [address]   SARS Coronavirus 2 by RT PCR (hospital order, performed in [facility] hospital lab) *cepheid single result test*     Status: None   Collection Time: 03/23/22  9:27 AM  Result Value Ref Range Status   SARS Coronavirus 2 by RT PCR NEGATIVE NEGATIVE Final    Comment: (NOTE) SARS-CoV-2 target nucleic acids are NOT DETECTED.  The SARS-CoV-2 RNA is generally detectable in upper and lower respiratory specimens during the acute phase of infection. The lowest concentration of SARS-CoV-2 viral copies this assay can detect is 250 copies / mL. A negative result does not preclude SARS-CoV-2 infection and should not be used as the sole basis for treatment or other patient management decisions.  A negative result may occur with improper specimen collection / handling, submission of specimen other than nasopharyngeal swab, presence of viral mutation(s) within the areas targeted by this assay, and inadequate number of viral copies (<250 copies / mL). A negative result must be combined with clinical observations, patient history, and epidemiological information.  Fact Sheet for Patients:   https://www.patel.info/  Fact Sheet for Healthcare Providers: https://hall.com/  This test is not yet approved or  cleared by the [place] FDA  and has been authorized for detection and/or diagnosis of SARS-CoV-2 by FDA under an Emergency Use Authorization (EUA).  This EUA will remain in effect (meaning this test can be used) for the duration of the COVID-19 declaration under Section 564(b)(1) of the Act, 21 U.S.C. section 360bbb-3(b)(1), unless the authorization is terminated or revoked sooner.  Performed at [facility], [place] [address], [place], [place] [address]   MRSA Next Gen by PCR, Nasal     Status: None   Collection Time: 03/23/22  9:27 AM  Result Value Ref Range Status   MRSA by PCR Next Gen NOT DETECTED NOT DETECTED Final    Comment: (NOTE) The GeneXpert MRSA Assay (FDA approved for NASAL specimens only), is one component of a comprehensive MRSA colonization surveillance program. It is not intended to diagnose MRSA infection nor to guide or monitor treatment for MRSA infections. Test performance is not FDA approved in patients less than 75 years old. Performed at [facility], [place] [address] [place], [place] [address]      Radiology Studies: DG Chest Port 1 View  Result Date: 03/24/2022 CLINICAL DATA:  Acute respiratory distress. EXAM: PORTABLE CHEST 1 VIEW COMPARISON:  PA Lat 03/15/2022. FINDINGS: The heart is enlarged.  There is a LAD coronary artery stent. There is aortic atherosclerosis with stable mediastinal configuration. There is increased perihilar vascular congestion and interstitial edema. Superimposed patchy consolidation in the right upper lobe is again seen and worsened which could be superimposed pneumonia, airspace edema or combination. There is increased hazy opacity in the lower lung fields which could be ground-glass edema, atelectasis or pneumonitis and small pleural effusions are also increased. Osteopenia. Advanced right-greater-than-left shoulder DJD again noted with degenerative change of the spine. IMPRESSION: 1. Increased vascular congestion and interstitial edema consistent with  CHF or fluid overload. 2. Increased small pleural effusions. 3. Increased haziness in the lower zones consistent with atelectasis, ground-glass edema or pneumonitis. 4. Continued right upper lobe patchy consolidation. Electronically Signed   By: Telford Nab M.D.   On: 03/24/2022 01:59   DG Chest 2 View  Result Date: 04/05/2022 CLINICAL DATA:  Shortness of breath, pneumonia EXAM: CHEST - 2 VIEW COMPARISON:  03/17/2022 FINDINGS: Cardiomegaly. New, heterogeneous airspace opacity most conspicuously present in the right upper lobe although likely bilateral. Small bilateral pleural effusions. Disc degenerative disease of the thoracic spine. IMPRESSION: 1. New, heterogeneous airspace opacity most conspicuously present in the right upper lobe although likely bilateral. Small bilateral pleural effusions. Findings are consistent with multifocal infection. 2. Cardiomegaly. Electronically Signed   By: Delanna Ahmadi M.D.   On: 03/30/2022 19:27    Scheduled Meds:  albuterol       amiodarone  200 mg Oral Daily   apixaban  2.5 mg Oral BID   aspirin  81 mg Oral Daily   empagliflozin  10 mg Oral QAC breakfast   ferrous sulfate  325 mg Oral Q breakfast   furosemide  40 mg Intravenous BID   insulin aspart  0-5 Units Subcutaneous QHS   insulin aspart  0-9 Units Subcutaneous TID WC   levothyroxine  88 mcg Oral Q0600   loratadine  10 mg Oral Daily   montelukast  10 mg Oral Daily   pantoprazole  40 mg Oral Daily   rosuvastatin  20 mg Oral Daily   Continuous Infusions:  ceFEPime (MAXIPIME) IV 2 g (03/24/22 0927)   vancomycin 750 mg (03/24/22 0546)     LOS: 1 day   Darliss Cheney, MD [facility]  03/24/2022, 10:48 AM   *Please note that this is a verbal dictation therefore any spelling or grammatical errors are due to the "[place]" system interpretation.  Please page via [place] and do not message via secure chat for urgent patient care matters. Secure chat can be used for non urgent  patient care matters.  How to contact the [facility] Attending or Consulting provider [place] or covering provider during after hours [place], for this patient?  Check the care team in [place] [facility] and look for a) attending/consulting [facility] provider listed and b) the [facility] team listed. Page or secure chat [phone]. Log into [email or web address] and use [place]'s universal password to access. If you do not have the password, please contact the hospital operator. Locate the [facility] provider you are looking for under [facility] and page to a number that you can be directly reached. If you still have difficulty reaching the provider, please page the [facility] (Director on Call) for the Hospitalists listed on amion for assistance.

## 2022-03-25 DIAGNOSIS — I480 Paroxysmal atrial fibrillation: Secondary | ICD-10-CM

## 2022-03-25 DIAGNOSIS — E039 Hypothyroidism, unspecified: Secondary | ICD-10-CM | POA: Diagnosis not present

## 2022-03-25 DIAGNOSIS — J9601 Acute respiratory failure with hypoxia: Secondary | ICD-10-CM | POA: Diagnosis not present

## 2022-03-25 DIAGNOSIS — J189 Pneumonia, unspecified organism: Secondary | ICD-10-CM | POA: Diagnosis not present

## 2022-03-25 LAB — GLUCOSE, CAPILLARY
Glucose-Capillary: 92 mg/dL (ref 70–99)
Glucose-Capillary: 151 mg/dL — ABNORMAL HIGH (ref 70–99)
Glucose-Capillary: 101 mg/dL — ABNORMAL HIGH (ref 70–99)
Glucose-Capillary: 159 mg/dL — ABNORMAL HIGH (ref 70–99)

## 2022-03-25 LAB — BASIC METABOLIC PANEL
Anion gap: 10 (ref 5–15)
BUN: 19 mg/dL (ref 8–23)
CO2: 25 mmol/L (ref 22–32)
Calcium: 8 mg/dL — ABNORMAL LOW (ref 8.9–10.3)
Chloride: 104 mmol/L (ref 98–111)
Creatinine, Ser: 1.4 mg/dL — ABNORMAL HIGH (ref 0.61–1.24)
GFR, Estimated: 48 mL/min — ABNORMAL LOW (ref >60)
Glucose, Bld: 114 mg/dL — ABNORMAL HIGH (ref 70–99)
Potassium: 3.4 mmol/L — ABNORMAL LOW (ref 3.5–5.1)
Sodium: 139 mmol/L (ref 135–145)

## 2022-03-25 MED ORDER — FUROSEMIDE 10 MG/ML IJ SOLN
40.0000 mg | Freq: Every day | INTRAMUSCULAR | Status: DC
Start: 2022-03-26 — End: 2022-03-28
  Administered 2022-03-26 – 2022-03-28 (×3): 40 mg via INTRAVENOUS
  Filled 2022-03-25 (×3): qty 4

## 2022-03-25 MED ORDER — MELATONIN 5 MG PO TABS
5.0000 mg | ORAL_TABLET | Freq: Every day | ORAL | Status: DC
  Administered 2022-03-25 – 2022-03-28 (×4): 5 mg via ORAL
  Filled 2022-03-25 (×5): qty 1

## 2022-03-25 MED ORDER — POTASSIUM CHLORIDE CRYS ER 20 MEQ PO TBCR
40.0000 meq | EXTENDED_RELEASE_TABLET | Freq: Once | ORAL | Status: AC
  Administered 2022-03-25: 40 meq via ORAL
  Filled 2022-03-25: qty 2

## 2022-03-25 MED ORDER — TRAZODONE HCL 50 MG PO TABS
25.0000 mg | ORAL_TABLET | Freq: Every evening | ORAL | Status: DC | PRN
  Administered 2022-03-26 (×2): 25 mg via ORAL
  Filled 2022-03-25 (×2): qty 1

## 2022-03-25 MED ORDER — TRAZODONE HCL 50 MG PO TABS: 50.0000 mg | ORAL_TABLET | Freq: Every evening | ORAL | Status: DC | PRN

## 2022-03-25 NOTE — Progress Notes (Signed)
Occupational Therapy Treatment Patient Details Name: Ronnie Joseph MRN: [id number] DOB: 17-Aug-1932 Today's Date: 03/25/2022   History of present illness 88 y.o. male presented to ED 03/23/22 with generalized weakness, shortness of breath, and cough. +multifocal pneumonia with sepsis; PMH significant of CAD, ischemic cardiomyopathy, paroxysmal A-fib on Eliquis, hypertension, hyperlipidemia, mild cognitive impairment, type 2 diabetes, anemia due to chronic blood loss from AVMs   OT comments  Pt pleased with his progress today. Able to stand from elevated surface with one person assist and march in place. Completed grooming with set up and UB bathing and dressing with min assist at EOB. Pt dependent on 10L 02. Educated in use of IS. Updated d/c recommendation to SNF for rehab.   Recommendations for follow up therapy are one component of a multi-disciplinary discharge planning process, led by the attending physician.  Recommendations may be updated based on patient status, additional functional criteria and insurance authorization.    Follow Up Recommendations  Skilled nursing-short term rehab (<3 hours/day)    Assistance Recommended at Discharge Frequent or constant Supervision/Assistance  Patient can return home with the following  A lot of help with walking and/or transfers;A lot of help with bathing/dressing/bathroom;Assistance with cooking/housework;Direct supervision/assist for financial management;Assist for transportation;Help with stairs or ramp for entrance   Equipment Recommendations  None recommended by OT    Recommendations for Other Services      Precautions / Restrictions Precautions Precautions: Fall Restrictions Weight Bearing Restrictions: No       Mobility Bed Mobility Overal bed mobility: Needs Assistance Bed Mobility: Supine to Sit, Sit to Supine     Supine to sit: Min assist, HOB elevated Sit to supine: Supervision   General bed mobility comments: assist to  raise trunk    Transfers Overall transfer level: Needs assistance Equipment used: Rolling walker (2 wheels) Transfers: Sit to/from Stand Sit to Stand: Min assist, Min guard           General transfer comment: from elevated bed, stood x 3 with min, progressing to min guard assist and use of momentum, pt uses a lift chair at home     Balance Overall balance assessment: Needs assistance   Sitting balance-Leahy Scale: Good       Standing balance-Leahy Scale: Poor Standing balance comment: stabilizes LEs on bed, use of RW                           ADL either performed or assessed with clinical judgement   ADL Overall ADL's : Needs assistance/impaired     Grooming: Wash/dry hands;Wash/dry face;Oral care;Sitting;Set up   Upper Body Bathing: Minimal assistance;Sitting       Upper Body Dressing : Minimal assistance;Sitting                          Extremity/Trunk Assessment              Vision       Perception     Praxis      Cognition Arousal/Alertness: Awake/alert Behavior During Therapy: WFL for tasks assessed/performed Overall Cognitive Status: Within Functional Limits for tasks assessed                                          Exercises      Shoulder Instructions  General Comments      Pertinent Vitals/ Pain       Pain Assessment Pain Assessment: No/denies pain  Home Living                                          Prior Functioning/Environment              Frequency  Min 2X/week        Progress Toward Goals  OT Goals(current goals can now be found in the care plan section)  Progress towards OT goals: Progressing toward goals  Acute Rehab OT Goals OT Goal Formulation: With patient Time For Goal Achievement: 04/07/22 Potential to Achieve Goals: Good  Plan Discharge plan needs to be updated    Co-evaluation                 AM-PAC OT "6 Clicks" Daily  Activity     Outcome Measure   Help from another person eating meals?: None Help from another person taking care of personal grooming?: A Little Help from another person toileting, which includes using toliet, bedpan, or urinal?: A Lot Help from another person bathing (including washing, rinsing, drying)?: A Lot Help from another person to put on and taking off regular upper body clothing?: A Little Help from another person to put on and taking off regular lower body clothing?: A Lot 6 Click Score: 16    End of Session Equipment Utilized During Treatment: Gait belt;Rolling walker (2 wheels);Oxygen (10L)  OT Visit Diagnosis: Unsteadiness on feet (R26.81);Other abnormalities of gait and mobility (R26.89);Muscle weakness (generalized) (M62.81)   Activity Tolerance Patient tolerated treatment well   Patient Left in bed;with call bell/phone within reach;with bed alarm set   Nurse Communication          Time: 1433-1510 OT Time Calculation (min): 37 min  Charges: OT General Charges $OT Visit: 1 Visit OT Treatments $Self Care/Home Management : 8-22 mins $Therapeutic Activity: 8-22 mins  Ronnie Joseph, OTR/L Acute Rehabilitation Services Office: [phone]  Malka So 03/25/2022, 3:43 PM

## 2022-03-25 NOTE — Progress Notes (Signed)
PROGRESS NOTE    Ronnie Joseph  ZRA:[id number] DOB: May 13, 1932 DOA: 04/08/2022 PCP: [name], NP   Brief Narrative:   Ronnie Joseph is a 88 y.o. male with medical history significant of CAD status post PCI 05/2019, ischemic cardiomyopathy, paroxysmal A-fib on Eliquis, hypertension, hyperlipidemia, mild cognitive impairment, type 2 diabetes, hypothyroidism, OSA, chronic cough, GERD, iron deficiency anemia due to chronic blood loss from AVMs and GI advised against repeat endoscopic procedures given his age. Recently seen in the ED on 6/6 for fevers, chills, and generalized weakness.  He was diagnosed with pneumonia (chest x-ray showing hazy right infrahilar opacity) and discharged on Augmentin and azithromycin.   He returned to the ED complaining of generalized weakness, shortness of breath, and cough.  SPO2 86-87% on room air and placed on 3 L supplemental oxygen.  Afebrile and not tachycardic or hypotensive.  Chest x-ray showing findings consistent with multifocal pneumonia. Patient was given vancomycin and cefepime.   Patient reports 1 week history of generalized weakness, cough, shortness of breath, and poor appetite.  States he feels so weak that he is barely able to get up and walk.  He does not use oxygen at home.  Reports chronic bilateral lower extremity edema for which she takes furosemide as needed if he gains weight.  He is vaccinated against COVID.  He is a retired Software engineer.  Assessment & Plan:   Principal Problem:   Multifocal pneumonia Active Problems:   Acquired hypothyroidism   Atrial fibrillation ([place])   Severe sepsis ([place])   Hypercholesterolemia   Acute respiratory failure with hypoxia (HCC)   Hypokalemia  Severe sepsis and acute hypoxic respiratory failure secondary to multifocal community-acquired pneumonia:  - Patient met severe sepsis criteria based on tachypnea and leukocytosis as well as acute hypoxia.  Evidence of pneumonia on the imaging study.  He failed  outpatient antibiotic therapy.  Urine antigen for Legionella but Streptococcus negative.  Lactic acid normal.  COVID negative.  P- -Screen MRSA is negative, stopped IV vancomycin. -Continue with IV cefepime. -Patient was encouraged with incentive spirometry and flutter valve.  Acute on chronic combined systolic and diastolic congestive heart failure:  -Early morning of 03/24/2022, patient was noted to have worsening respiratory distress and requiring more oxygen.  He was seen by night hospitalist.  Repeat chest x-ray was done which shows worsening chest x-ray with infiltrates and possible pulmonary edema.  BNP was also checked which was elevated.  He was given 1 dose of IV Lasix.  I repeated ABG this morning which appears to be stable.  Rechecking procalcitonin.  Based on this, it appears that patient may be having acute on chronic CHF as well.  I will start him on Lasix 40 mg IV twice daily with a strict I's and O's and low-sodium diet and daily weight.  He may have some component of pneumonitis.  If he does not improve with diuretics, may need to consider to start steroids.  Mild hypokalemia: Replaced and resolved.  CAD s/p PCI/hyperlipidemia: No ACS symptoms.  Continue statin and aspirin.  Paroxysmal atrial fibrillation: Currently in sinus rhythm.  Continue amiodarone and Eliquis.  History of essential hypertension: Blood pressure on the low side.  Continue to hold losartan.   CKD stage IIIa: At baseline.  Non-insulin-dependent type 2 diabetes mellitus:  - Resumed Jardiance.  Continue SSI.  Blood sugar controlled.  Acquired hypothyroidism: Continue Synthroid.  GERD: Continue PPI.  Chronic blood loss anemia/iron deficiency anemia: Due to chronic blood loss from AVMs and  GI advised against repeat endoscopic procedures given his age.  He is followed by hematology and continues to be on Eliquis for A-fib.  Hemoglobin currently 10.7, was 11.4 on 03/17/2022. -Continue iron supplement -Continue  to monitor hemoglobin  Generalized weakness: PT OT consulted.  DVT prophylaxis: apixaban (ELIQUIS) tablet 2.5 mg Start: 03/23/22 1000   Code Status: DNR  Family Communication: none at bedside Status is: Inpatient Remains inpatient appropriate because: Patient sick with significant hypoxia  Estimated body mass index is 25.07 kg/m as calculated from the following:   Height as of this encounter: '5\' 8"'  (1.727 m).   Weight as of this encounter: 74.8 kg.    Nutritional Assessment: Body mass index is 25.07 kg/m.Marland Kitchen Seen by dietician.  I agree with the assessment and plan as outlined below: Nutrition Status:        . Skin Assessment: I have examined the patient's skin and I agree with the wound assessment as performed by the wound care RN as outlined below:    Consultants:  None  Procedures:  None  Antimicrobials:  Anti-infectives (From admission, onward)    Start     Dose/Rate Route Frequency Ordered Stop   03/24/22 0500  vancomycin (VANCOREADY) IVPB 750 mg/150 mL  Status:  Discontinued        750 mg 150 mL/hr over 60 Minutes Intravenous Every 24 hours 03/23/22 0515 03/24/22 1051   03/23/22 1600  ceFEPIme (MAXIPIME) 2 g in sodium chloride 0.9 % 100 mL IVPB        2 g 200 mL/hr over 30 Minutes Intravenous Every 12 hours 03/23/22 0515     03/23/22 0315  ceFEPIme (MAXIPIME) 2 g in sodium chloride 0.9 % 100 mL IVPB        2 g 200 mL/hr over 30 Minutes Intravenous  Once 03/23/22 0307 03/23/22 0459   03/23/22 0315  vancomycin (VANCOREADY) IVPB 1500 mg/300 mL        1,500 mg 150 mL/hr over 120 Minutes Intravenous  Once 03/23/22 0307 03/23/22 0724         Subjective:  No significant events overnight as discussed with staff, patient reports insomnia overnight and poor sleep.  .   Objective: Vitals:   03/25/22 0000 03/25/22 0400 03/25/22 0500 03/25/22 0754  BP: (!) 122/51 (!) 129/51  (!) 128/56  Pulse: 78 79  81  Resp: 19   17  Temp: 98.5 F (36.9 C) 98.4 F (36.9  C)  98.2 F (36.8 C)  TempSrc: Oral Oral  Oral  SpO2: 96% 95%  93%  Weight:   74.8 kg   Height:        Intake/Output Summary (Last 24 hours) at 03/25/2022 1254 Last data filed at 03/25/2022 1100 Gross per 24 hour  Intake 560 ml  Output 3200 ml  Net -2640 ml   Filed Weights   04/06/2022 1834 03/25/22 0500  Weight: 77.1 kg 74.8 kg    Examination:  Awake Alert, Oriented X 3, No new F.N deficits, Normal affect, deconditioned, frail Symmetrical Chest wall movement, scattered Rales at the bases RRR,No Gallops,Rubs or new Murmurs, No Parasternal Heave +ve B.Sounds, Abd Soft, No tenderness, No rebound - guarding or rigidity. No Cyanosis, Clubbing or edema, No new Rash or bruise      Data Reviewed: I have personally reviewed following labs and imaging studies  CBC: Recent Labs  Lab 04/02/2022 1903 03/23/22 0624 03/24/22 0444  WBC 13.1* 12.8* 17.3*  NEUTROABS 10.3*  --  15.0*  HGB 10.7*  12.1* 10.4*  HCT 32.3* 35.9* 31.4*  MCV 104.9* 103.5* 103.3*  PLT 467* 440* 937*   Basic Metabolic Panel: Recent Labs  Lab 03/14/2022 1903 03/23/22 0624 03/24/22 0444  NA 139 139 140  K 3.4* 3.6 4.2  CL 105 107 108  CO2 23 21* 23  GLUCOSE 127* 110* 133*  BUN '16 15 16  ' CREATININE 1.42* 1.33* 1.33*  CALCIUM 8.2* 8.0* 8.2*  MG  --  2.5*  --    GFR: Estimated Creatinine Clearance: 37.1 mL/min (A) (by C-G formula based on SCr of 1.33 mg/dL (H)). Liver Function Tests: Recent Labs  Lab 03/19/2022 1903  AST 55*  ALT 54*  ALKPHOS 53  BILITOT 0.8  PROT 5.9*  ALBUMIN 2.6*   No results for input(s): "LIPASE", "AMYLASE" in the last 168 hours. No results for input(s): "AMMONIA" in the last 168 hours. Coagulation Profile: No results for input(s): "INR", "PROTIME" in the last 168 hours. Cardiac Enzymes: No results for input(s): "CKTOTAL", "CKMB", "CKMBINDEX", "TROPONINI" in the last 168 hours. BNP (last 3 results) No results for input(s): "PROBNP" in the last 8760 hours. HbA1C: Recent  Labs    03/23/22 0624  HGBA1C 5.8*   CBG: Recent Labs  Lab 03/24/22 1224 03/24/22 1719 03/24/22 2152 03/25/22 0755 03/25/22 1207  GLUCAP 130* 80 117* 92 151*   Lipid Profile: No results for input(s): "CHOL", "HDL", "LDLCALC", "TRIG", "CHOLHDL", "LDLDIRECT" in the last 72 hours. Thyroid Function Tests: No results for input(s): "TSH", "T4TOTAL", "FREET4", "T3FREE", "THYROIDAB" in the last 72 hours. Anemia Panel: No results for input(s): "VITAMINB12", "FOLATE", "FERRITIN", "TIBC", "IRON", "RETICCTPCT" in the last 72 hours. Sepsis Labs: Recent Labs  Lab 03/23/22 0349 03/23/22 1121 03/24/22 0444  PROCALCITON  --  <0.10 0.19  LATICACIDVEN 1.7  --   --     Recent Results (from the past 240 hour(s))  Urine Culture     Status: Abnormal   Collection Time: 03/17/22  8:52 AM   Specimen: Urine, Clean Catch  Result Value Ref Range Status   Specimen Description URINE, CLEAN CATCH  Final   Special Requests NONE  Final   Culture (A)  Final    <10,000 COLONIES/mL INSIGNIFICANT GROWTH Performed at [facility], [place] [address], [place], [place] [address]    Report Status 03/18/2022 FINAL  Final  Resp Panel by RT-PCR (Flu A&B, Covid) Anterior Nasal Swab     Status: None   Collection Time: 03/17/22  8:53 AM   Specimen: Anterior Nasal Swab  Result Value Ref Range Status   SARS Coronavirus 2 by RT PCR NEGATIVE NEGATIVE Final    Comment: (NOTE) SARS-CoV-2 target nucleic acids are NOT DETECTED.  The SARS-CoV-2 RNA is generally detectable in upper respiratory specimens during the acute phase of infection. The lowest concentration of SARS-CoV-2 viral copies this assay can detect is 138 copies/mL. A negative result does not preclude SARS-Cov-2 infection and should not be used as the sole basis for treatment or other patient management decisions. A negative result may occur with  improper specimen collection/handling, submission of specimen other than nasopharyngeal swab, presence  of viral mutation(s) within the areas targeted by this assay, and inadequate number of viral copies(<138 copies/mL). A negative result must be combined with clinical observations, patient history, and epidemiological information. The expected result is Negative.  Fact Sheet for Patients:  EntrepreneurPulse.com.au  Fact Sheet for Healthcare Providers:  IncredibleEmployment.be  This test is no t yet approved or cleared by the [place] and  has been authorized for detection and/or diagnosis of SARS-CoV-2 by FDA under an Emergency Use Authorization (EUA). This EUA will remain  in effect (meaning this test can be used) for the duration of the COVID-19 declaration under Section 564(b)(1) of the Act, 21 U.S.C.section 360bbb-3(b)(1), unless the authorization is terminated  or revoked sooner.       Influenza A by PCR NEGATIVE NEGATIVE Final   Influenza B by PCR NEGATIVE NEGATIVE Final    Comment: (NOTE) The Xpert Xpress SARS-CoV-2/FLU/RSV plus assay is intended as an aid in the diagnosis of influenza from Nasopharyngeal swab specimens and should not be used as a sole basis for treatment. Nasal washings and aspirates are unacceptable for Xpert Xpress SARS-CoV-2/FLU/RSV testing.  Fact Sheet for Patients: EntrepreneurPulse.com.au  Fact Sheet for Healthcare Providers: IncredibleEmployment.be  This test is not yet approved or cleared by the [place] FDA and has been authorized for detection and/or diagnosis of SARS-CoV-2 by FDA under an Emergency Use Authorization (EUA). This EUA will remain in effect (meaning this test can be used) for the duration of the COVID-19 declaration under Section 564(b)(1) of the Act, 21 U.S.C. section 360bbb-3(b)(1), unless the authorization is terminated or revoked.  Performed at [facility], [place] [address] [place], [place] [address]   Blood culture (routine x 2)      Status: None (Preliminary result)   Collection Time: 03/23/22  3:44 AM   Specimen: BLOOD  Result Value Ref Range Status   Specimen Description BLOOD SITE NOT SPECIFIED  Final   Special Requests   Final    BOTTLES DRAWN AEROBIC AND ANAEROBIC Blood Culture adequate volume   Culture   Final    NO GROWTH 2 DAYS Performed at [facility], [address], [place], [place] [address]    Report Status PENDING  Incomplete  Blood culture (routine x 2)     Status: None (Preliminary result)   Collection Time: 03/23/22  3:49 AM   Specimen: BLOOD  Result Value Ref Range Status   Specimen Description BLOOD SITE NOT SPECIFIED  Final   Special Requests   Final    BOTTLES DRAWN AEROBIC AND ANAEROBIC Blood Culture adequate volume   Culture   Final    NO GROWTH 2 DAYS Performed at [facility], [address] [place], [place] [address]    Report Status PENDING  Incomplete  SARS Coronavirus 2 by RT PCR (hospital order, performed in [facility] hospital lab) *cepheid single result test*     Status: None   Collection Time: 03/23/22  9:27 AM  Result Value Ref Range Status   SARS Coronavirus 2 by RT PCR NEGATIVE NEGATIVE Final    Comment: (NOTE) SARS-CoV-2 target nucleic acids are NOT DETECTED.  The SARS-CoV-2 RNA is generally detectable in upper and lower respiratory specimens during the acute phase of infection. The lowest concentration of SARS-CoV-2 viral copies this assay can detect is 250 copies / mL. A negative result does not preclude SARS-CoV-2 infection and should not be used as the sole basis for treatment or other patient management decisions.  A negative result may occur with improper specimen collection / handling, submission of specimen other than nasopharyngeal swab, presence of viral mutation(s) within the areas targeted by this assay, and inadequate number of viral copies (<250 copies / mL). A negative result must be combined with clinical observations, patient  history, and epidemiological information.  Fact Sheet for Patients:   https://www.patel.info/  Fact Sheet for Healthcare  Providers: https://hall.com/  This test is not yet approved or  cleared by the [place] and has been authorized for detection and/or diagnosis of SARS-CoV-2 by FDA under an Emergency Use Authorization (EUA).  This EUA will remain in effect (meaning this test can be used) for the duration of the COVID-19 declaration under Section 564(b)(1) of the Act, 21 U.S.C. section 360bbb-3(b)(1), unless the authorization is terminated or revoked sooner.  Performed at [facility], [place] [address], [place], [place] [address]   MRSA Next Gen by PCR, Nasal     Status: None   Collection Time: 03/23/22  9:27 AM  Result Value Ref Range Status   MRSA by PCR Next Gen NOT DETECTED NOT DETECTED Final    Comment: (NOTE) The GeneXpert MRSA Assay (FDA approved for NASAL specimens only), is one component of a comprehensive MRSA colonization surveillance program. It is not intended to diagnose MRSA infection nor to guide or monitor treatment for MRSA infections. Test performance is not FDA approved in patients less than 47 years old. Performed at [facility], [place] [address], [place], [place] [address]      Radiology Studies: DG Chest Port 1 View  Result Date: 03/24/2022 CLINICAL DATA:  Acute respiratory distress. EXAM: PORTABLE CHEST 1 VIEW COMPARISON:  PA Lat 04/08/2022. FINDINGS: The heart is enlarged.  There is a LAD coronary artery stent. There is aortic atherosclerosis with stable mediastinal configuration. There is increased perihilar vascular congestion and interstitial edema. Superimposed patchy consolidation in the right upper lobe is again seen and worsened which could be superimposed pneumonia, airspace edema or combination. There is increased hazy opacity in the lower lung fields which could be ground-glass edema,  atelectasis or pneumonitis and small pleural effusions are also increased. Osteopenia. Advanced right-greater-than-left shoulder DJD again noted with degenerative change of the spine. IMPRESSION: 1. Increased vascular congestion and interstitial edema consistent with CHF or fluid overload. 2. Increased small pleural effusions. 3. Increased haziness in the lower zones consistent with atelectasis, ground-glass edema or pneumonitis. 4. Continued right upper lobe patchy consolidation. Electronically Signed   By: Telford Nab M.D.   On: 03/24/2022 01:59    Scheduled Meds:  amiodarone  200 mg Oral Daily   apixaban  2.5 mg Oral BID   aspirin  81 mg Oral Daily   empagliflozin  10 mg Oral QAC breakfast   ferrous sulfate  325 mg Oral Q breakfast   furosemide  40 mg Intravenous BID   insulin aspart  0-5 Units Subcutaneous QHS   insulin aspart  0-9 Units Subcutaneous TID WC   levothyroxine  88 mcg Oral Q0600   loratadine  10 mg Oral Daily   montelukast  10 mg Oral Daily   pantoprazole  40 mg Oral Daily   rosuvastatin  20 mg Oral Daily   Continuous Infusions:  ceFEPime (MAXIPIME) IV 2 g (03/25/22 0846)     LOS: 2 days   Phillips Climes, MD [facility]  03/25/2022, 12:54 PM

## 2022-03-25 NOTE — TOC Initial Note (Signed)
Transition of Care [facility]) - Initial/Assessment Note    Patient Details  Name: Ronnie Joseph MRN: [id number] Date of Birth: Mar 09, 1932  Transition of [facility] CM/SW Contact:    Carles Collet, RN Phone Number: 03/25/2022, 11:24 AM  Clinical Narrative:           Damaris Schooner w patient at bedside to discuss needs for discharge/ discharge planning.      Asked patient how he feels he is tolerating ambualtion, and discussed PT recommendations. He states that he feels he will need short term SNF for rehab before returning home. He did not have a preference for any particular facility.  Currenty on HFNC, weak, may progress. Updated CSW, will follow.    Expected Discharge Plan: Skilled Nursing Facility Barriers to Discharge: Continued Medical Work up   Patient Goals and CMS Choice Patient states their goals for this hospitalization and ongoing recovery are:: patient feels he will need short term SNF for rehab before returning home      Expected Discharge Plan and Services Expected Discharge Plan: [place] In-house Referral: Clinical Social Work                                            Prior Living Arrangements/Services                       Activities of Daily Living Home Assistive Devices/Equipment: None ADL Screening (condition at time of admission) Patient's cognitive ability adequate to safely complete daily activities?: Yes Is the patient deaf or have difficulty hearing?: Yes Does the patient have difficulty seeing, even when wearing glasses/contacts?: No Does the patient have difficulty concentrating, remembering, or making decisions?: No Patient able to express need for assistance with ADLs?: Yes Does the patient have difficulty dressing or bathing?: Yes Independently performs ADLs?: No Communication: Independent Dressing (OT): Dependent Is this a change from baseline?: Pre-admission baseline Grooming: Needs assistance Is this a change from  baseline?: Pre-admission baseline Feeding: Needs assistance Is this a change from baseline?: Pre-admission baseline Bathing: Dependent Is this a change from baseline?: Pre-admission baseline Toileting: Needs assistance Is this a change from baseline?: Pre-admission baseline In/Out Bed: Needs assistance Is this a change from baseline?: Pre-admission baseline Walks in Home: Needs assistance Is this a change from baseline?: Pre-admission baseline Does the patient have difficulty walking or climbing stairs?: Yes Weakness of Legs: Both Weakness of Arms/Hands: None  Permission Sought/Granted                  Emotional Assessment              Admission diagnosis:  Multifocal pneumonia [J18.9] Patient Active Problem List   Diagnosis Date Noted   Multifocal pneumonia 03/23/2022   Acute respiratory failure with hypoxia ([place]) 03/23/2022   Hypokalemia 03/23/2022   Senile purpura ([place]) 12/11/2021   Acquired hypothyroidism 11/17/2020   Low hemoglobin 11/17/2020   Primary hypertension 11/17/2020   Coronary artery disease due to lipid rich plaque 11/17/2020   Mild cognitive impairment 11/17/2020   Basal cell carcinoma (BCC) of scalp 11/17/2020   Iron deficiency anemia due to chronic blood loss 11/17/2020   Unsteady gait 11/17/2020   Atrial fibrillation ([place]) 11/17/2020   Hypertensive heart disease with heart failure ([place]) 11/17/2020   Insomnia due to medical condition 11/17/2020   Benign prostatic hyperplasia with nocturia 11/17/2020  Intrinsic eczema 11/17/2020   Severe sepsis ([place]) 09/26/2020   Schatzki's ring 08/07/2020   History of basal cell carcinoma 11/02/2019   NSTEMI (non-ST elevated myocardial infarction) ([place]) 06/03/2019   Syncope 05/30/2019   Ischemic cardiomyopathy 05/30/2019   Malignant neoplasm of skin 12/08/2004   OSA (obstructive sleep apnea) 08/13/2003   Peripheral vascular disease ([place]) 04/19/2003   Hypercholesterolemia 03/14/2001   PCP:  [name], NP Pharmacy:   [place], [place] [address] [place] [address] Phone: [phone] Fax: [phone]     Social Determinants of Health (SDOH) Interventions    Readmission Risk Interventions     No data to display

## 2022-03-25 NOTE — Progress Notes (Signed)
Pt refused CPAP/BiPAP for the evening. RT will cont to monitor as needed.

## 2022-03-26 ENCOUNTER — Inpatient Hospital Stay (HOSPITAL_COMMUNITY): Payer: Medicare Other

## 2022-03-26 DIAGNOSIS — E039 Hypothyroidism, unspecified: Secondary | ICD-10-CM | POA: Diagnosis not present

## 2022-03-26 DIAGNOSIS — J189 Pneumonia, unspecified organism: Secondary | ICD-10-CM | POA: Diagnosis not present

## 2022-03-26 DIAGNOSIS — J9601 Acute respiratory failure with hypoxia: Secondary | ICD-10-CM | POA: Diagnosis not present

## 2022-03-26 LAB — CBC
HCT: 30.1 % — ABNORMAL LOW (ref 39.0–52.0)
Hemoglobin: 10 g/dL — ABNORMAL LOW (ref 13.0–17.0)
MCH: 33.6 pg (ref 26.0–34.0)
MCHC: 33.2 g/dL (ref 30.0–36.0)
MCV: 101 fL — ABNORMAL HIGH (ref 80.0–100.0)
Platelets: 567 10*3/uL — ABNORMAL HIGH (ref 150–400)
RBC: 2.98 MIL/uL — ABNORMAL LOW (ref 4.22–5.81)
RDW: 13.2 % (ref 11.5–15.5)
WBC: 11.9 10*3/uL — ABNORMAL HIGH (ref 4.0–10.5)
nRBC: 0 % (ref 0.0–0.2)

## 2022-03-26 LAB — BASIC METABOLIC PANEL
Anion gap: 10 (ref 5–15)
BUN: 19 mg/dL (ref 8–23)
CO2: 24 mmol/L (ref 22–32)
Calcium: 8 mg/dL — ABNORMAL LOW (ref 8.9–10.3)
Chloride: 105 mmol/L (ref 98–111)
Creatinine, Ser: 1.38 mg/dL — ABNORMAL HIGH (ref 0.61–1.24)
GFR, Estimated: 49 mL/min — ABNORMAL LOW (ref >60)
Glucose, Bld: 99 mg/dL (ref 70–99)
Potassium: 3.8 mmol/L (ref 3.5–5.1)
Sodium: 139 mmol/L (ref 135–145)

## 2022-03-26 LAB — GLUCOSE, CAPILLARY
Glucose-Capillary: 95 mg/dL (ref 70–99)
Glucose-Capillary: 203 mg/dL — ABNORMAL HIGH (ref 70–99)
Glucose-Capillary: 193 mg/dL — ABNORMAL HIGH (ref 70–99)
Glucose-Capillary: 136 mg/dL — ABNORMAL HIGH (ref 70–99)

## 2022-03-26 LAB — MAGNESIUM: Magnesium: 2.3 mg/dL (ref 1.7–2.4)

## 2022-03-26 MED ORDER — BENZONATATE 100 MG PO CAPS
100.0000 mg | ORAL_CAPSULE | Freq: Three times a day (TID) | ORAL | Status: DC | PRN
  Administered 2022-03-26: 100 mg via ORAL
  Filled 2022-03-26: qty 1

## 2022-03-26 MED ORDER — SODIUM CHLORIDE 0.9 % IV SOLN
500.0000 mg | INTRAVENOUS | Status: DC
  Administered 2022-03-26 – 2022-03-29 (×4): 500 mg via INTRAVENOUS
  Filled 2022-03-26 (×4): qty 5

## 2022-03-26 MED ORDER — GUAIFENESIN ER 600 MG PO TB12
600.0000 mg | ORAL_TABLET | Freq: Two times a day (BID) | ORAL | Status: DC
  Administered 2022-03-26 – 2022-03-31 (×11): 600 mg via ORAL
  Filled 2022-03-26 (×11): qty 1

## 2022-03-26 MED ORDER — POTASSIUM CHLORIDE CRYS ER 20 MEQ PO TBCR
20.0000 meq | EXTENDED_RELEASE_TABLET | Freq: Once | ORAL | Status: AC
Start: 2022-03-26 — End: 2022-03-26
  Administered 2022-03-26: 20 meq via ORAL
  Filled 2022-03-26: qty 1

## 2022-03-26 NOTE — Progress Notes (Signed)
Physical Therapy Treatment Patient Details Name: Ronnie Joseph MRN: [id number] DOB: 1932-04-01 Today's Date: 03/26/2022   History of Present Illness 88 y.o. male presented to ED 03/23/22 with generalized weakness, shortness of breath, and cough. +multifocal pneumonia with sepsis; PMH significant of CAD, ischemic cardiomyopathy, paroxysmal A-fib on Eliquis, hypertension, hyperlipidemia, mild cognitive impairment, type 2 diabetes, anemia due to chronic blood loss from AVMs    PT Comments    Patient progressing slowly. Able to walk 12 ft with RW with min assist and close follow with chair behind him. Seated rest and able to repeat. Lowest sats 87% with recovery to 88% in < 1 minute. Noted pt has made slower than anticipated progress and pt/family feel he needs SNF prior to discharge home. Agree.     Recommendations for follow up therapy are one component of a multi-disciplinary discharge planning process, led by the attending physician.  Recommendations may be updated based on patient status, additional functional criteria and insurance authorization.  Follow Up Recommendations  Skilled nursing-short term rehab (<3 hours/day) (if progresses appropriately; may decline further and need SNF)     Assistance Recommended at Discharge Intermittent Supervision/Assistance  Patient can return home with the following A lot of help with bathing/dressing/bathroom;Assistance with cooking/housework;Assist for transportation;Help with stairs or ramp for entrance;Two people to help with walking and/or transfers   Equipment Recommendations  None recommended by PT    Recommendations for Other Services       Precautions / Restrictions Precautions Precautions: Fall Restrictions Weight Bearing Restrictions: No     Mobility  Bed Mobility Overal bed mobility: Needs Assistance Bed Mobility: Supine to Sit     Supine to sit: Min assist, HOB elevated     General bed mobility comments: assist to scoot pelvis  to EOB to prepare for transfer    Transfers Overall transfer level: Needs assistance Equipment used: Rolling walker (2 wheels) Transfers: Sit to/from Stand Sit to Stand: Min assist, From elevated surface (to simulate his lift chair)           General transfer comment: from elevated bed min, from recliner +2 mod assist to come to stand (pt uses lift chair at home)    Ambulation/Gait Ambulation/Gait assistance: Min assist, +2 safety/equipment Gait Distance (Feet): 12 Feet (seated rest; 12) Assistive device: Rolling walker (2 wheels) Gait Pattern/deviations: Decreased stride length, Knee flexed in stance - right, Knee flexed in stance - left, Step-to pattern       General Gait Details: pt with crouched posture and kyphotic; limited by lines/tubes/oxygen; close follow with chair   Stairs             Wheelchair Mobility    Modified Rankin (Stroke Patients Only)       Balance Overall balance assessment: Needs assistance Sitting-balance support: Bilateral upper extremity supported, Feet supported Sitting balance-Leahy Scale: Good Sitting balance - Comments: can reach towards feet to pull up socks     Standing balance-Leahy Scale: Poor Standing balance comment: stabilizes LEs on bed, use of RW                            Cognition Arousal/Alertness: Awake/alert Behavior During Therapy: WFL for tasks assessed/performed Overall Cognitive Status: Within Functional Limits for tasks assessed  Exercises General Exercises - Lower Extremity Long Arc Quad: AROM, Both, 10 reps, Seated Heel Raises: AROM, Both, 10 reps, Seated    General Comments General comments (skin integrity, edema, etc.): on 10L HFNC throughout session with lowest sats 87% after walking; recovered to 88% <1 minute      Pertinent Vitals/Pain Pain Assessment Pain Assessment: No/denies pain    Home Living                           Prior Function            PT Goals (current goals can now be found in the care plan section) Acute Rehab PT Goals Patient Stated Goal: feel better PT Goal Formulation: With patient Time For Goal Achievement: 04/07/22 Potential to Achieve Goals: Good Progress towards PT goals: Progressing toward goals    Frequency    Min 2X/week      PT Plan Discharge plan needs to be updated;Frequency needs to be updated    Co-evaluation              AM-PAC PT "6 Clicks" Mobility   Outcome Measure  Help needed turning from your back to your side while in a flat bed without using bedrails?: A Little Help needed moving from lying on your back to sitting on the side of a flat bed without using bedrails?: A Little Help needed moving to and from a bed to a chair (including a wheelchair)?: A Lot Help needed standing up from a chair using your arms (e.g., wheelchair or bedside chair)?: A Lot Help needed to walk in hospital room?: Total Help needed climbing 3-5 steps with a railing? : Total 6 Click Score: 12    End of Session Equipment Utilized During Treatment: Oxygen Activity Tolerance: Patient limited by fatigue Patient left: with call bell/phone within reach;in chair;with chair alarm set Nurse Communication: Mobility status PT Visit Diagnosis: Difficulty in walking, not elsewhere classified (R26.2);Muscle weakness (generalized) (M62.81)     Time: 1325-1350 PT Time Calculation (min) (ACUTE ONLY): 25 min  Charges:  $Gait Training: 8-22 mins $Therapeutic Exercise: 8-22 mins                      Arby Barrette, PT Acute Rehabilitation Services  Office [phone]    Rexanne Mano 03/26/2022, 2:01 PM

## 2022-03-26 NOTE — Care Management Important Message (Signed)
Important Message  Patient Details  Name: Glenmore Karl MRN: [id number] Date of Birth: 1931/10/21   Medicare Important Message Given:  Yes     Masud Holub 03/26/2022, 2:22 PM

## 2022-03-26 NOTE — Plan of Care (Signed)
  Problem: Education: Goal: Ability to describe self-care measures that may prevent or decrease complications (Diabetes Survival Skills Education) will improve Outcome: Progressing Goal: Individualized Educational Video(s) Outcome: Progressing   Problem: Coping: Goal: Ability to adjust to condition or change in health will improve Outcome: Progressing   Problem: Health Behavior/Discharge Planning: Goal: Ability to identify and utilize available resources and services will improve Outcome: Progressing Goal: Ability to manage health-related needs will improve Outcome: Progressing   Problem: Metabolic: Goal: Ability to maintain appropriate glucose levels will improve Outcome: Progressing   Problem: Nutritional: Goal: Maintenance of adequate nutrition will improve Outcome: Progressing Goal: Progress toward achieving an optimal weight will improve Outcome: Progressing   Problem: Skin Integrity: Goal: Risk for impaired skin integrity will decrease Outcome: Progressing   Problem: Tissue Perfusion: Goal: Adequacy of tissue perfusion will improve Outcome: Progressing   Problem: Education: Goal: Knowledge of General Education information will improve Description: Including pain rating scale, medication(s)/side effects and non-pharmacologic comfort measures Outcome: Progressing   Problem: Health Behavior/Discharge Planning: Goal: Ability to manage health-related needs will improve Outcome: Progressing   Problem: Clinical Measurements: Goal: Ability to maintain clinical measurements within normal limits will improve Outcome: Progressing Goal: Will remain free from infection Outcome: Progressing Goal: Diagnostic test results will improve Outcome: Progressing Goal: Respiratory complications will improve Outcome: Progressing Goal: Cardiovascular complication will be avoided Outcome: Progressing   Problem: Activity: Goal: Risk for activity intolerance will decrease Outcome:  Progressing   Problem: Nutrition: Goal: Adequate nutrition will be maintained Outcome: Progressing   Problem: Coping: Goal: Level of anxiety will decrease Outcome: Progressing   Problem: Elimination: Goal: Will not experience complications related to bowel motility Outcome: Progressing Goal: Will not experience complications related to urinary retention Outcome: Progressing   Problem: Pain Managment: Goal: General experience of comfort will improve Outcome: Progressing   Problem: Safety: Goal: Ability to remain free from injury will improve Outcome: Progressing   Problem: Skin Integrity: Goal: Risk for impaired skin integrity will decrease Outcome: Progressing

## 2022-03-26 NOTE — Care Management Important Message (Signed)
Important Message  Patient Details  Name: Ronnie Joseph MRN: [id number] Date of Birth: [birth year]   Medicare Important Message Given:  Yes     Orbie Pyo 03/26/2022, 2:23 PM

## 2022-03-26 NOTE — Progress Notes (Signed)
PROGRESS NOTE    Rodrigo Mcgranahan  WUJ:[id number] DOB: 1932/07/10 DOA: 03/12/2022 PCP: [name], NP   Brief Narrative:   Ronnie Joseph is a 88 y.o. male with medical history significant of CAD status post PCI 05/2019, ischemic cardiomyopathy, paroxysmal A-fib on Eliquis, hypertension, hyperlipidemia, mild cognitive impairment, type 2 diabetes, hypothyroidism, OSA, chronic cough, GERD, iron deficiency anemia due to chronic blood loss from AVMs and GI advised against repeat endoscopic procedures given his age. Recently seen in the ED on 6/6 for fevers, chills, and generalized weakness.  He was diagnosed with pneumonia (chest x-ray showing hazy right infrahilar opacity) and discharged on Augmentin and azithromycin.   He returned to the ED complaining of generalized weakness, shortness of breath, and cough.  SPO2 86-87% on room air and placed on 3 L supplemental oxygen.  Afebrile and not tachycardic or hypotensive.  Chest x-ray showing findings consistent with multifocal pneumonia. Patient was given vancomycin and cefepime.   Patient reports 1 week history of generalized weakness, cough, shortness of breath, and poor appetite.  States he feels so weak that he is barely able to get up and walk.  He does not use oxygen at home.  Reports chronic bilateral lower extremity edema for which she takes furosemide as needed if he gains weight.  He is vaccinated against COVID.  He is a retired Software engineer.  Assessment & Plan:   Principal Problem:   Multifocal pneumonia Active Problems:   Acquired hypothyroidism   Atrial fibrillation ([place])   Severe sepsis ([place])   Hypercholesterolemia   Acute respiratory failure with hypoxia (HCC)   Hypokalemia  Severe sepsis and acute hypoxic respiratory failure secondary to multifocal community-acquired pneumonia:  - Patient met severe sepsis criteria based on tachypnea and leukocytosis as well as acute hypoxia.  Evidence of pneumonia on the imaging study.  He failed  outpatient antibiotic therapy.  Urine antigen for Legionella but Streptococcus negative.  Lactic acid normal.  COVID negative.  P- -Screen MRSA is negative, stopped IV vancomycin. -Continue with IV cefepime. -Patient was encouraged with incentive spirometry and flutter valve. -Patient with increased requirement today, he is up to 10 L oxygen, will repeat stat x-ray  Acute on chronic combined systolic and diastolic congestive heart failure:  -Continue with IV diuresis, monitor BMP, continue with strict ins and out.  Mild hypokalemia: Replaced and resolved.  CAD s/p PCI/hyperlipidemia: No ACS symptoms.  Continue statin and aspirin.  Paroxysmal atrial fibrillation: Currently in sinus rhythm.  Continue amiodarone and Eliquis.  History of essential hypertension: Blood pressure on the low side.  Continue to hold losartan.   CKD stage IIIa: At baseline.  Non-insulin-dependent type 2 diabetes mellitus:  - Resumed Jardiance.  Continue SSI.  Blood sugar controlled.  Acquired hypothyroidism: Continue Synthroid.  GERD: Continue PPI.  Chronic blood loss anemia/iron deficiency anemia: Due to chronic blood loss from AVMs and GI advised against repeat endoscopic procedures given his age.  He is followed by hematology and continues to be on Eliquis for A-fib.  Hemoglobin currently 10.7, was 11.4 on 03/17/2022. -Continue iron supplement -Continue to monitor hemoglobin  Generalized weakness: PT OT consulted.  DVT prophylaxis: apixaban (ELIQUIS) tablet 2.5 mg Start: 03/23/22 1000   Code Status: DNR  Family Communication: none at bedside Status is: Inpatient Remains inpatient appropriate because: Patient sick with significant hypoxia  Estimated body mass index is 25.07 kg/m as calculated from the following:   Height as of this encounter: '5\' 8"'  (1.727 m).   Weight as of  this encounter: 74.8 kg.    Nutritional Assessment: Body mass index is 25.07 kg/m.Marland Kitchen Seen by dietician.  I agree with the  assessment and plan as outlined below: Nutrition Status:        . Skin Assessment: I have examined the patient's skin and I agree with the wound assessment as performed by the wound care RN as outlined below:    Consultants:  None  Procedures:  None  Antimicrobials:  Anti-infectives (From admission, onward)    Start     Dose/Rate Route Frequency Ordered Stop   03/24/22 0500  vancomycin (VANCOREADY) IVPB 750 mg/150 mL  Status:  Discontinued        750 mg 150 mL/hr over 60 Minutes Intravenous Every 24 hours 03/23/22 0515 03/24/22 1051   03/23/22 1600  ceFEPIme (MAXIPIME) 2 g in sodium chloride 0.9 % 100 mL IVPB        2 g 200 mL/hr over 30 Minutes Intravenous Every 12 hours 03/23/22 0515     03/23/22 0315  ceFEPIme (MAXIPIME) 2 g in sodium chloride 0.9 % 100 mL IVPB        2 g 200 mL/hr over 30 Minutes Intravenous  Once 03/23/22 0307 03/23/22 0459   03/23/22 0315  vancomycin (VANCOREADY) IVPB 1500 mg/300 mL        1,500 mg 150 mL/hr over 120 Minutes Intravenous  Once 03/23/22 0307 03/23/22 0724         Subjective:  Still requiring significant oxygen, reports cough and requesting Tessalon Perles.   Objective: Vitals:   03/26/22 0000 03/26/22 0350 03/26/22 0758 03/26/22 1148  BP: (!) 118/55 (!) 119/57 (!) 125/48 (!) 105/46  Pulse: 82 92 81 80  Resp: 18 20    Temp: 99 F (37.2 C) 97.8 F (36.6 C) 97.8 F (36.6 C) 98.5 F (36.9 C)  TempSrc: Oral Oral Oral Oral  SpO2: 96% 92% (!) 89% 94%  Weight:      Height:        Intake/Output Summary (Last 24 hours) at 03/26/2022 1444 Last data filed at 03/26/2022 0357 Gross per 24 hour  Intake --  Output 270 ml  Net -270 ml   Filed Weights   03/28/2022 1834 03/25/22 0500  Weight: 77.1 kg 74.8 kg    Examination:  Awake Alert, Oriented X 3, No new F.N deficits, Normal affect, deconditioned, frail Symmetrical Chest wall movement, diminished air entry at the bases with scattered Rales RRR,No Gallops,Rubs or new  Murmurs, No Parasternal Heave +ve B.Sounds, Abd Soft, No tenderness, No rebound - guarding or rigidity. No Cyanosis, Clubbing or edema, No new Rash or bruise      Data Reviewed: I have personally reviewed following labs and imaging studies  CBC: Recent Labs  Lab 03/15/2022 1903 03/23/22 0624 03/24/22 0444 03/26/22 0120  WBC 13.1* 12.8* 17.3* 11.9*  NEUTROABS 10.3*  --  15.0*  --   HGB 10.7* 12.1* 10.4* 10.0*  HCT 32.3* 35.9* 31.4* 30.1*  MCV 104.9* 103.5* 103.3* 101.0*  PLT 467* 440* 511* 244*   Basic Metabolic Panel: Recent Labs  Lab 03/14/2022 1903 03/23/22 0624 03/24/22 0444 03/25/22 1322 03/26/22 0120  NA 139 139 140 139 139  K 3.4* 3.6 4.2 3.4* 3.8  CL 105 107 108 104 105  CO2 23 21* '23 25 24  ' GLUCOSE 127* 110* 133* 114* 99  BUN '16 15 16 19 19  ' CREATININE 1.42* 1.33* 1.33* 1.40* 1.38*  CALCIUM 8.2* 8.0* 8.2* 8.0* 8.0*  MG  --  2.5*  --   --  2.3   GFR: Estimated Creatinine Clearance: 35.8 mL/min (A) (by C-G formula based on SCr of 1.38 mg/dL (H)). Liver Function Tests: Recent Labs  Lab 03/15/2022 1903  AST 55*  ALT 54*  ALKPHOS 53  BILITOT 0.8  PROT 5.9*  ALBUMIN 2.6*   No results for input(s): "LIPASE", "AMYLASE" in the last 168 hours. No results for input(s): "AMMONIA" in the last 168 hours. Coagulation Profile: No results for input(s): "INR", "PROTIME" in the last 168 hours. Cardiac Enzymes: No results for input(s): "CKTOTAL", "CKMB", "CKMBINDEX", "TROPONINI" in the last 168 hours. BNP (last 3 results) No results for input(s): "PROBNP" in the last 8760 hours. HbA1C: No results for input(s): "HGBA1C" in the last 72 hours.  CBG: Recent Labs  Lab 03/25/22 1207 03/25/22 1726 03/25/22 2229 03/26/22 0808 03/26/22 1150  GLUCAP 151* 101* 159* 95 203*   Lipid Profile: No results for input(s): "CHOL", "HDL", "LDLCALC", "TRIG", "CHOLHDL", "LDLDIRECT" in the last 72 hours. Thyroid Function Tests: No results for input(s): "TSH", "T4TOTAL", "FREET4",  "T3FREE", "THYROIDAB" in the last 72 hours. Anemia Panel: No results for input(s): "VITAMINB12", "FOLATE", "FERRITIN", "TIBC", "IRON", "RETICCTPCT" in the last 72 hours. Sepsis Labs: Recent Labs  Lab 03/23/22 0349 03/23/22 1121 03/24/22 0444  PROCALCITON  --  <0.10 0.19  LATICACIDVEN 1.7  --   --     Recent Results (from the past 240 hour(s))  Urine Culture     Status: Abnormal   Collection Time: 03/17/22  8:52 AM   Specimen: Urine, Clean Catch  Result Value Ref Range Status   Specimen Description URINE, CLEAN CATCH  Final   Special Requests NONE  Final   Culture (A)  Final    <10,000 COLONIES/mL INSIGNIFICANT GROWTH Performed at [facility], [place] [address], [place], [place] [address]    Report Status 03/18/2022 FINAL  Final  Resp Panel by RT-PCR (Flu A&B, Covid) Anterior Nasal Swab     Status: None   Collection Time: 03/17/22  8:53 AM   Specimen: Anterior Nasal Swab  Result Value Ref Range Status   SARS Coronavirus 2 by RT PCR NEGATIVE NEGATIVE Final    Comment: (NOTE) SARS-CoV-2 target nucleic acids are NOT DETECTED.  The SARS-CoV-2 RNA is generally detectable in upper respiratory specimens during the acute phase of infection. The lowest concentration of SARS-CoV-2 viral copies this assay can detect is 138 copies/mL. A negative result does not preclude SARS-Cov-2 infection and should not be used as the sole basis for treatment or other patient management decisions. A negative result may occur with  improper specimen collection/handling, submission of specimen other than nasopharyngeal swab, presence of viral mutation(s) within the areas targeted by this assay, and inadequate number of viral copies(<138 copies/mL). A negative result must be combined with clinical observations, patient history, and epidemiological information. The expected result is Negative.  Fact Sheet for Patients:  EntrepreneurPulse.com.au  Fact Sheet for Healthcare  Providers:  IncredibleEmployment.be  This test is no t yet approved or cleared by the [place] FDA and  has been authorized for detection and/or diagnosis of SARS-CoV-2 by FDA under an Emergency Use Authorization (EUA). This EUA will remain  in effect (meaning this test can be used) for the duration of the COVID-19 declaration under Section 564(b)(1) of the Act, 21 U.S.C.section 360bbb-3(b)(1), unless the authorization is terminated  or revoked sooner.       Influenza A by PCR NEGATIVE NEGATIVE Final   Influenza B by  PCR NEGATIVE NEGATIVE Final    Comment: (NOTE) The Xpert Xpress SARS-CoV-2/FLU/RSV plus assay is intended as an aid in the diagnosis of influenza from Nasopharyngeal swab specimens and should not be used as a sole basis for treatment. Nasal washings and aspirates are unacceptable for Xpert Xpress SARS-CoV-2/FLU/RSV testing.  Fact Sheet for Patients: EntrepreneurPulse.com.au  Fact Sheet for Healthcare Providers: IncredibleEmployment.be  This test is not yet approved or cleared by the [place] FDA and has been authorized for detection and/or diagnosis of SARS-CoV-2 by FDA under an Emergency Use Authorization (EUA). This EUA will remain in effect (meaning this test can be used) for the duration of the COVID-19 declaration under Section 564(b)(1) of the Act, 21 U.S.C. section 360bbb-3(b)(1), unless the authorization is terminated or revoked.  Performed at [facility], [place] [address], [place], [place] [address]   Blood culture (routine x 2)     Status: None (Preliminary result)   Collection Time: 03/23/22  3:44 AM   Specimen: BLOOD  Result Value Ref Range Status   Specimen Description BLOOD SITE NOT SPECIFIED  Final   Special Requests   Final    BOTTLES DRAWN AEROBIC AND ANAEROBIC Blood Culture adequate volume   Culture   Final    NO GROWTH 3 DAYS Performed at [facility], [address] [place], [place] [address]    Report Status PENDING  Incomplete  Blood culture (routine x 2)     Status: None (Preliminary result)   Collection Time: 03/23/22  3:49 AM   Specimen: BLOOD  Result Value Ref Range Status   Specimen Description BLOOD SITE NOT SPECIFIED  Final   Special Requests   Final    BOTTLES DRAWN AEROBIC AND ANAEROBIC Blood Culture adequate volume   Culture   Final    NO GROWTH 3 DAYS Performed at [facility], [address], [place], [place] [address]    Report Status PENDING  Incomplete  SARS Coronavirus 2 by RT PCR (hospital order, performed in [facility] hospital lab) *cepheid single result test*     Status: None   Collection Time: 03/23/22  9:27 AM  Result Value Ref Range Status   SARS Coronavirus 2 by RT PCR NEGATIVE NEGATIVE Final    Comment: (NOTE) SARS-CoV-2 target nucleic acids are NOT DETECTED.  The SARS-CoV-2 RNA is generally detectable in upper and lower respiratory specimens during the acute phase of infection. The lowest concentration of SARS-CoV-2 viral copies this assay can detect is 250 copies / mL. A negative result does not preclude SARS-CoV-2 infection and should not be used as the sole basis for treatment or other patient management decisions.  A negative result may occur with improper specimen collection / handling, submission of specimen other than nasopharyngeal swab, presence of viral mutation(s) within the areas targeted by this assay, and inadequate number of viral copies (<250 copies / mL). A negative result must be combined with clinical observations, patient history, and epidemiological information.  Fact Sheet for Patients:   https://www.patel.info/  Fact Sheet for Healthcare Providers: https://hall.com/  This test is not yet approved or  cleared by the [place] FDA and has been authorized for detection and/or diagnosis of SARS-CoV-2 by FDA under an Emergency Use  Authorization (EUA).  This EUA will remain in effect (meaning this test can be used) for the duration of the COVID-19 declaration under Section 564(b)(1) of the Act, 21 U.S.C. section 360bbb-3(b)(1), unless the authorization is terminated or revoked sooner.  Performed at [facility], [place] [address], [place], [place] [address]   MRSA Next Gen by PCR, Nasal     Status: None   Collection Time: 03/23/22  9:27 AM  Result Value Ref Range Status   MRSA by PCR Next Gen NOT DETECTED NOT DETECTED Final    Comment: (NOTE) The GeneXpert MRSA Assay (FDA approved for NASAL specimens only), is one component of a comprehensive MRSA colonization surveillance program. It is not intended to diagnose MRSA infection nor to guide or monitor treatment for MRSA infections. Test performance is not FDA approved in patients less than 46 years old. Performed at [facility], [place] [address], [place], [place] [address]      Radiology Studies: No results found.  Scheduled Meds:  amiodarone  200 mg Oral Daily   apixaban  2.5 mg Oral BID   aspirin  81 mg Oral Daily   empagliflozin  10 mg Oral QAC breakfast   ferrous sulfate  325 mg Oral Q breakfast   furosemide  40 mg Intravenous Daily   guaiFENesin  600 mg Oral BID   insulin aspart  0-5 Units Subcutaneous QHS   insulin aspart  0-9 Units Subcutaneous TID WC   levothyroxine  88 mcg Oral Q0600   loratadine  10 mg Oral Daily   melatonin  5 mg Oral QHS   montelukast  10 mg Oral Daily   pantoprazole  40 mg Oral Daily   rosuvastatin  20 mg Oral Daily   Continuous Infusions:  ceFEPime (MAXIPIME) IV 2 g (03/26/22 0814)     LOS: 3 days   Phillips Climes, MD [facility]  03/26/2022, 2:44 PM

## 2022-03-27 ENCOUNTER — Inpatient Hospital Stay (HOSPITAL_COMMUNITY): Payer: Medicare Other

## 2022-03-27 DIAGNOSIS — J9601 Acute respiratory failure with hypoxia: Secondary | ICD-10-CM | POA: Diagnosis not present

## 2022-03-27 DIAGNOSIS — J189 Pneumonia, unspecified organism: Secondary | ICD-10-CM | POA: Diagnosis not present

## 2022-03-27 DIAGNOSIS — I480 Paroxysmal atrial fibrillation: Secondary | ICD-10-CM | POA: Diagnosis not present

## 2022-03-27 LAB — BASIC METABOLIC PANEL
Anion gap: 13 (ref 5–15)
BUN: 21 mg/dL (ref 8–23)
CO2: 23 mmol/L (ref 22–32)
Calcium: 8.3 mg/dL — ABNORMAL LOW (ref 8.9–10.3)
Chloride: 104 mmol/L (ref 98–111)
Creatinine, Ser: 1.41 mg/dL — ABNORMAL HIGH (ref 0.61–1.24)
GFR, Estimated: 48 mL/min — ABNORMAL LOW (ref >60)
Glucose, Bld: 115 mg/dL — ABNORMAL HIGH (ref 70–99)
Potassium: 4 mmol/L (ref 3.5–5.1)
Sodium: 140 mmol/L (ref 135–145)

## 2022-03-27 LAB — CBC
HCT: 31 % — ABNORMAL LOW (ref 39.0–52.0)
Hemoglobin: 10.1 g/dL — ABNORMAL LOW (ref 13.0–17.0)
MCH: 33.8 pg (ref 26.0–34.0)
MCHC: 32.6 g/dL (ref 30.0–36.0)
MCV: 103.7 fL — ABNORMAL HIGH (ref 80.0–100.0)
Platelets: 522 10*3/uL — ABNORMAL HIGH (ref 150–400)
RBC: 2.99 MIL/uL — ABNORMAL LOW (ref 4.22–5.81)
RDW: 13.3 % (ref 11.5–15.5)
WBC: 13.2 10*3/uL — ABNORMAL HIGH (ref 4.0–10.5)
nRBC: 0 % (ref 0.0–0.2)

## 2022-03-27 LAB — PROCALCITONIN: Procalcitonin: 0.3 ng/mL

## 2022-03-27 LAB — GLUCOSE, CAPILLARY
Glucose-Capillary: 124 mg/dL — ABNORMAL HIGH (ref 70–99)
Glucose-Capillary: 105 mg/dL — ABNORMAL HIGH (ref 70–99)
Glucose-Capillary: 109 mg/dL — ABNORMAL HIGH (ref 70–99)
Glucose-Capillary: 120 mg/dL — ABNORMAL HIGH (ref 70–99)

## 2022-03-27 MED ORDER — LIP MEDEX EX OINT
TOPICAL_OINTMENT | CUTANEOUS | Status: DC | PRN
  Administered 2022-03-28: 75 via TOPICAL
  Filled 2022-03-27: qty 7

## 2022-03-27 MED ORDER — DIPHENHYDRAMINE HCL 25 MG PO CAPS
25.0000 mg | ORAL_CAPSULE | Freq: Once | ORAL | Status: DC | PRN
Start: 2022-03-27 — End: 2022-04-01

## 2022-03-27 MED ORDER — BLISTEX MEDICATED EX OINT
TOPICAL_OINTMENT | CUTANEOUS | Status: DC | PRN
Start: 2022-03-27 — End: 2022-03-27

## 2022-03-27 NOTE — Progress Notes (Signed)
PROGRESS NOTE    Raphel Stickles  WUJ:[id number] DOB: 06/23/1932 DOA: 03/25/2022 PCP: [name], NP   Brief Narrative:   Ronnie Joseph is a 88 y.o. male with medical history significant of CAD status post PCI 05/2019, ischemic cardiomyopathy, paroxysmal A-fib on Eliquis, hypertension, hyperlipidemia, mild cognitive impairment, type 2 diabetes, hypothyroidism, OSA, chronic cough, GERD, iron deficiency anemia due to chronic blood loss from AVMs and GI advised against repeat endoscopic procedures given his age. Recently seen in the ED on 6/6 for fevers, chills, and generalized weakness.  He was diagnosed with pneumonia (chest x-ray showing hazy right infrahilar opacity) and discharged on Augmentin and azithromycin.   He returned to the ED complaining of generalized weakness, shortness of breath, and cough.  SPO2 86-87% on room air and placed on 3 L supplemental oxygen.  Afebrile and not tachycardic or hypotensive.  Chest x-ray showing findings consistent with multifocal pneumonia. Patient was given vancomycin and cefepime.   Patient reports 1 week history of generalized weakness, cough, shortness of breath, and poor appetite.  States he feels so weak that he is barely able to get up and walk.  He does not use oxygen at home.  Reports chronic bilateral lower extremity edema for which she takes furosemide as needed if he gains weight.  He is vaccinated against COVID.  He is a retired Software engineer.  Assessment & Plan:   Principal Problem:   Multifocal pneumonia Active Problems:   Acquired hypothyroidism   Atrial fibrillation ([place])   Severe sepsis ([place])   Hypercholesterolemia   Acute respiratory failure with hypoxia (HCC)   Hypokalemia  Severe sepsis and acute hypoxic respiratory failure secondary to multifocal community-acquired pneumonia:  - Patient met severe sepsis criteria based on tachypnea and leukocytosis as well as acute hypoxia.  Evidence of pneumonia on the imaging study.  He failed  outpatient antibiotic therapy.  Urine antigen for Legionella but Streptococcus negative.  Lactic acid normal.  COVID negative.   -Screen MRSA is negative, stopped IV vancomycin. -Continue with IV cefepime, and IV azithromycin -Patient was encouraged with incentive spirometry and flutter valve. -Patient remains with significant oxygen requirement, he remains on 10 L oxygen via nasal cannula today, CT chest was obtained which did show multifocal pneumonia.  Acute on chronic combined systolic and diastolic congestive heart failure:  -Continue with IV diuresis, monitor BNP, continue with strict ins and out.  Mild hypokalemia:  Replaced and resolved.  CAD s/p PCI/hyperlipidemia: No ACS symptoms.  Continue statin and aspirin.  Paroxysmal atrial fibrillation: Currently in sinus rhythm.  Continue amiodarone and Eliquis.  History of essential hypertension: Blood pressure on the low side.  Continue to hold losartan.   CKD stage IIIa: At baseline.  Non-insulin-dependent type 2 diabetes mellitus:  - Resumed Jardiance.  Continue SSI.  Blood sugar controlled.  Acquired hypothyroidism: Continue Synthroid.  GERD: Continue PPI.  Chronic blood loss anemia/iron deficiency anemia: Due to chronic blood loss from AVMs and GI advised against repeat endoscopic procedures given his age.  He is followed by hematology and continues to be on Eliquis for A-fib.  Hemoglobin currently 10.7, was 11.4 on 03/17/2022. -Continue iron supplement -Continue to monitor hemoglobin  Generalized weakness: PT OT consulted.  DVT prophylaxis: apixaban (ELIQUIS) tablet 2.5 mg Start: 03/23/22 1000   Code Status: DNR  Family Communication: none at bedside, left daughter a voicemail Status is: Inpatient Remains inpatient appropriate because: Patient sick with significant hypoxia  Estimated body mass index is 24.91 kg/m as calculated from the  following:   Height as of this encounter: 5' 8" (1.727 m).   Weight as of this  encounter: 74.3 kg.    Nutritional Assessment: Body mass index is 24.91 kg/m.Marland Kitchen Seen by dietician.  I agree with the assessment and plan as outlined below: Nutrition Status:        . Skin Assessment: I have examined the patient's skin and I agree with the wound assessment as performed by the wound care RN as outlined below:    Consultants:  None  Procedures:  None  Antimicrobials:  Anti-infectives (From admission, onward)    Start     Dose/Rate Route Frequency Ordered Stop   03/26/22 1730  azithromycin (ZITHROMAX) 500 mg in sodium chloride 0.9 % 250 mL IVPB        500 mg 250 mL/hr over 60 Minutes Intravenous Every 24 hours 03/26/22 1650     03/24/22 0500  vancomycin (VANCOREADY) IVPB 750 mg/150 mL  Status:  Discontinued        750 mg 150 mL/hr over 60 Minutes Intravenous Every 24 hours 03/23/22 0515 03/24/22 1051   03/23/22 1600  ceFEPIme (MAXIPIME) 2 g in sodium chloride 0.9 % 100 mL IVPB        2 g 200 mL/hr over 30 Minutes Intravenous Every 12 hours 03/23/22 0515     03/23/22 0315  ceFEPIme (MAXIPIME) 2 g in sodium chloride 0.9 % 100 mL IVPB        2 g 200 mL/hr over 30 Minutes Intravenous  Once 03/23/22 0307 03/23/22 0459   03/23/22 0315  vancomycin (VANCOREADY) IVPB 1500 mg/300 mL        1,500 mg 150 mL/hr over 120 Minutes Intravenous  Once 03/23/22 0307 03/23/22 0724         Subjective:  Patient still complaining of insomnia and poor night sleep, still reports dyspnea with activity.,  Still reports cough.     Objective: Vitals:   03/27/22 0426 03/27/22 0827 03/27/22 1239 03/27/22 1249  BP: (!) 106/53 (!) 119/55 (!) 113/47   Pulse: 70 87 80 81  Resp: 18   18  Temp: 98.3 F (36.8 C) 98 F (36.7 C) 97.9 F (36.6 C)   TempSrc: Oral Oral Oral   SpO2: 100% 90% 90% 94%  Weight:      Height:        Intake/Output Summary (Last 24 hours) at 03/27/2022 1313 Last data filed at 03/27/2022 1239 Gross per 24 hour  Intake --  Output 1710 ml  Net -1710 ml    Filed Weights   04/04/2022 1834 03/25/22 0500 03/27/22 0202  Weight: 77.1 kg 74.8 kg 74.3 kg    Examination:  Awake Alert, Oriented X 3, No new F.N deficits, Normal affect, deconditioned, frail Symmetrical Chest wall movement, Good air movement bilaterally, scattered Rales, and no use of accessory muscles. RRR,No Gallops,Rubs or new Murmurs, No Parasternal Heave +ve B.Sounds, Abd Soft, No tenderness, No rebound - guarding or rigidity. No Cyanosis, Clubbing or edema, No new Rash or bruise      Data Reviewed: I have personally reviewed following labs and imaging studies  CBC: Recent Labs  Lab 03/19/2022 1903 03/23/22 0624 03/24/22 0444 03/26/22 0120 03/27/22 0049  WBC 13.1* 12.8* 17.3* 11.9* 13.2*  NEUTROABS 10.3*  --  15.0*  --   --   HGB 10.7* 12.1* 10.4* 10.0* 10.1*  HCT 32.3* 35.9* 31.4* 30.1* 31.0*  MCV 104.9* 103.5* 103.3* 101.0* 103.7*  PLT 467* 440* 511* 567* 522*  Basic Metabolic Panel: Recent Labs  Lab 03/23/22 0624 03/24/22 0444 03/25/22 1322 03/26/22 0120 03/27/22 0049  NA 139 140 139 139 140  K 3.6 4.2 3.4* 3.8 4.0  CL 107 108 104 105 104  CO2 21* _0 GLUCOSE 110* 133* 114* 99 115*  BUN _1 CREATININE 1.33* 1.33* 1.40* 1.38* 1.41*  CALCIUM 8.0* 8.2* 8.0* 8.0* 8.3*  MG 2.5*  --   --  2.3  --    GFR: Estimated Creatinine Clearance: 35 mL/min (A) (by C-G formula based on SCr of 1.41 mg/dL (H)). Liver Function Tests: Recent Labs  Lab 03/21/2022 1903  AST 55*  ALT 54*  ALKPHOS 53  BILITOT 0.8  PROT 5.9*  ALBUMIN 2.6*   No results for input(s): "LIPASE", "AMYLASE" in the last 168 hours. No results for input(s): "AMMONIA" in the last 168 hours. Coagulation Profile: No results for input(s): "INR", "PROTIME" in the last 168 hours. Cardiac Enzymes: No results for input(s): "CKTOTAL", "CKMB", "CKMBINDEX", "TROPONINI" in the last 168 hours. BNP (last 3 results) No results for input(s): "PROBNP" in the last 8760  hours. HbA1C: No results for input(s): "HGBA1C" in the last 72 hours.  CBG: Recent Labs  Lab 03/26/22 1150 03/26/22 1555 03/26/22 2215 03/27/22 0830 03/27/22 1237  GLUCAP 203* 193* 136* 124* 105*   Lipid Profile: No results for input(s): "CHOL", "HDL", "LDLCALC", "TRIG", "CHOLHDL", "LDLDIRECT" in the last 72 hours. Thyroid Function Tests: No results for input(s): "TSH", "T4TOTAL", "FREET4", "T3FREE", "THYROIDAB" in the last 72 hours. Anemia Panel: No results for input(s): "VITAMINB12", "FOLATE", "FERRITIN", "TIBC", "IRON", "RETICCTPCT" in the last 72 hours. Sepsis Labs: Recent Labs  Lab 03/23/22 0349 03/23/22 1121 03/24/22 0444 03/27/22 0049  PROCALCITON  --  <0.10 0.19 0.30  LATICACIDVEN 1.7  --   --   --     Recent Results (from the past 240 hour(s))  Blood culture (routine x 2)     Status: None (Preliminary result)   Collection Time: 03/23/22  3:44 AM   Specimen: BLOOD  Result Value Ref Range Status   Specimen Description BLOOD SITE NOT SPECIFIED  Final   Special Requests   Final    BOTTLES DRAWN AEROBIC AND ANAEROBIC Blood Culture adequate volume   Culture   Final    NO GROWTH 4 DAYS Performed at [facility], [address] [place], [place] [address]    Report Status PENDING  Incomplete  Blood culture (routine x 2)     Status: None (Preliminary result)   Collection Time: 03/23/22  3:49 AM   Specimen: BLOOD  Result Value Ref Range Status   Specimen Description BLOOD SITE NOT SPECIFIED  Final   Special Requests   Final    BOTTLES DRAWN AEROBIC AND ANAEROBIC Blood Culture adequate volume   Culture   Final    NO GROWTH 4 DAYS Performed at [facility], [address] [place], [place] [address]    Report Status PENDING  Incomplete  SARS Coronavirus 2 by RT PCR (hospital order, performed in [facility] hospital lab) *cepheid single result test*     Status: None   Collection Time: 03/23/22  9:27 AM  Result Value Ref Range Status   SARS  Coronavirus 2 by RT PCR NEGATIVE NEGATIVE Final    Comment: (NOTE) SARS-CoV-2 target nucleic acids are NOT DETECTED.  The SARS-CoV-2 RNA is generally detectable in upper and lower respiratory specimens during the acute phase of  infection. The lowest concentration of SARS-CoV-2 viral copies this assay can detect is 250 copies / mL. A negative result does not preclude SARS-CoV-2 infection and should not be used as the sole basis for treatment or other patient management decisions.  A negative result may occur with improper specimen collection / handling, submission of specimen other than nasopharyngeal swab, presence of viral mutation(s) within the areas targeted by this assay, and inadequate number of viral copies (<250 copies / mL). A negative result must be combined with clinical observations, patient history, and epidemiological information.  Fact Sheet for Patients:   https://www.patel.info/  Fact Sheet for Healthcare Providers: https://hall.com/  This test is not yet approved or  cleared by the [place] FDA and has been authorized for detection and/or diagnosis of SARS-CoV-2 by FDA under an Emergency Use Authorization (EUA).  This EUA will remain in effect (meaning this test can be used) for the duration of the COVID-19 declaration under Section 564(b)(1) of the Act, 21 U.S.C. section 360bbb-3(b)(1), unless the authorization is terminated or revoked sooner.  Performed at [facility], [place] [address] [place], [place] [address]   MRSA Next Gen by PCR, Nasal     Status: None   Collection Time: 03/23/22  9:27 AM  Result Value Ref Range Status   MRSA by PCR Next Gen NOT DETECTED NOT DETECTED Final    Comment: (NOTE) The GeneXpert MRSA Assay (FDA approved for NASAL specimens only), is one component of a comprehensive MRSA colonization surveillance program. It is not intended to diagnose MRSA infection nor to guide or monitor  treatment for MRSA infections. Test performance is not FDA approved in patients less than 17 years old. Performed at [facility], [place] [address], [place], [place] [address]      Radiology Studies: CT CHEST WO CONTRAST  Result Date: 03/27/2022 CLINICAL DATA:  Hypoxia EXAM: CT CHEST WITHOUT CONTRAST TECHNIQUE: Multidetector CT imaging of the chest was performed following the standard protocol without IV contrast. RADIATION DOSE REDUCTION: This exam was performed according to the departmental dose-optimization program which includes automated exposure control, adjustment of the mA and/or kV according to patient size and/or use of iterative reconstruction technique. COMPARISON:  Chest x-ray 03/26/2022, CT 01/05/2022 FINDINGS: Cardiovascular: Mild cardiomegaly. No pericardial effusion. Thoracic aorta is nonaneurysmal. Atherosclerotic calcification of the aorta and coronary arteries. Main pulmonary trunk is mildly dilated. Mediastinum/Nodes: Interval development of numerous mildly prominent mediastinal and bilateral hilar lymph nodes including 10 mm upper right paratracheal node (series 3, image 39). No axillary lymphadenopathy. Thyroid, trachea, and esophagus demonstrate no significant findings. Lungs/Pleura: Multifocal bilateral airspace consolidations, most confluent within the right upper lobe with air bronchograms. Predominantly ground-glass opacities within the remaining lung fields. Moderate right and small left pleural effusions. Dependent left lower lobe atelectasis. No pneumothorax. Upper Abdomen: No acute abnormality. Musculoskeletal: Chronic mild superior endplate compression fractures of T2 and T3, unchanged. Severe degenerative disc disease of C6-7. Severe bilateral glenohumeral joint arthropathy. No new or acute bony findings. No chest wall abnormality. IMPRESSION: 1. Appearance compatible with multifocal pneumonia, most confluent within the right upper lobe. 2. Moderate right and small  left pleural effusions. 3. Interval development of numerous mildly prominent mediastinal and bilateral hilar lymph nodes, likely reactive. 4. Aortic and coronary artery atherosclerosis (ICD10-I70.0). 5. Mildly dilated main pulmonary trunk, which can be seen in the setting of pulmonary arterial hypertension. Electronically Signed   By: Davina Poke D.O.   On: 03/27/2022 10:12   DG Chest  Port 1 View  Result Date: 03/26/2022 CLINICAL DATA:  Shortness of breath, hypoxia, pneumonia EXAM: PORTABLE CHEST 1 VIEW COMPARISON:  Previous studies including the examination of 03/24/2022 FINDINGS: Transverse diameter of heart is increased. There is interval decrease in interstitial markings in the parahilar regions. There are patchy infiltrates in the right upper lung fields right lower lung fields and both parahilar regions. There is slight interval decrease in infiltrate in the right upper lung fields and slight worsening of infiltrate in the right lower lung fields. Lateral CP angles are clear. There is no pneumothorax. Degenerative changes are noted in both shoulders, more severe on the right side. IMPRESSION: Infiltrates are seen in both lungs with interval slight decrease in infiltrate in the right upper lung fields and slight worsening of infiltrate in the right lower lung fields. Findings suggest multifocal pneumonia involving both lungs. There is decrease in interstitial markings in the parahilar regions suggesting possible resolving interstitial pulmonary edema. Electronically Signed   By: Elmer Picker M.D.   On: 03/26/2022 16:17    Scheduled Meds:  amiodarone  200 mg Oral Daily   apixaban  2.5 mg Oral BID   aspirin  81 mg Oral Daily   empagliflozin  10 mg Oral QAC breakfast   ferrous sulfate  325 mg Oral Q breakfast   furosemide  40 mg Intravenous Daily   guaiFENesin  600 mg Oral BID   insulin aspart  0-5 Units Subcutaneous QHS   insulin aspart  0-9 Units Subcutaneous TID WC   levothyroxine   88 mcg Oral Q0600   loratadine  10 mg Oral Daily   melatonin  5 mg Oral QHS   montelukast  10 mg Oral Daily   pantoprazole  40 mg Oral Daily   rosuvastatin  20 mg Oral Daily   Continuous Infusions:  azithromycin 500 mg (03/26/22 1708)   ceFEPime (MAXIPIME) IV 2 g (03/27/22 0812)     LOS: 4 days   Phillips Climes, MD [facility]  03/27/2022, 1:13 PM

## 2022-03-27 NOTE — Plan of Care (Signed)
  Problem: Education: Goal: Ability to describe self-care measures that may prevent or decrease complications (Diabetes Survival Skills Education) will improve Outcome: Progressing Goal: Individualized Educational Video(s) Outcome: Progressing   Problem: Coping: Goal: Ability to adjust to condition or change in health will improve Outcome: Progressing   Problem: Health Behavior/Discharge Planning: Goal: Ability to identify and utilize available resources and services will improve Outcome: Progressing Goal: Ability to manage health-related needs will improve Outcome: Progressing   Problem: Metabolic: Goal: Ability to maintain appropriate glucose levels will improve Outcome: Progressing   Problem: Nutritional: Goal: Maintenance of adequate nutrition will improve Outcome: Progressing Goal: Progress toward achieving an optimal weight will improve Outcome: Progressing   Problem: Skin Integrity: Goal: Risk for impaired skin integrity will decrease Outcome: Progressing   Problem: Tissue Perfusion: Goal: Adequacy of tissue perfusion will improve Outcome: Progressing   Problem: Education: Goal: Knowledge of General Education information will improve Description: Including pain rating scale, medication(s)/side effects and non-pharmacologic comfort measures Outcome: Progressing   Problem: Health Behavior/Discharge Planning: Goal: Ability to manage health-related needs will improve Outcome: Progressing   Problem: Clinical Measurements: Goal: Ability to maintain clinical measurements within normal limits will improve Outcome: Progressing Goal: Will remain free from infection Outcome: Progressing Goal: Diagnostic test results will improve Outcome: Progressing Goal: Respiratory complications will improve Outcome: Progressing Goal: Cardiovascular complication will be avoided Outcome: Progressing   Problem: Activity: Goal: Risk for activity intolerance will decrease Outcome:  Progressing   Problem: Nutrition: Goal: Adequate nutrition will be maintained Outcome: Progressing   Problem: Coping: Goal: Level of anxiety will decrease Outcome: Progressing   Problem: Elimination: Goal: Will not experience complications related to bowel motility Outcome: Progressing Goal: Will not experience complications related to urinary retention Outcome: Progressing   Problem: Pain Managment: Goal: General experience of comfort will improve Outcome: Progressing   Problem: Safety: Goal: Ability to remain free from injury will improve Outcome: Progressing   Problem: Skin Integrity: Goal: Risk for impaired skin integrity will decrease Outcome: Progressing

## 2022-03-27 NOTE — TOC Progression Note (Signed)
Transition of Care [facility]) - Progression Note    Patient Details  Name: Ronnie Joseph MRN: [id number] Date of Birth: 10/09/1932  Transition of Care [facility]) CM/SW [place], LCSW Phone Number: 03/27/2022, 5:26 PM  Clinical Narrative:    CSW received consult for possible SNF placement at time of discharge. CSW spoke with patient's daughter at bedside. She reported that patient resides at [facility] and they are  currently unable to care for patient given patient's current physical needs and fall risk. She expressed understanding of PT recommendation and is agreeable to SNF placement at time of discharge. Patient reports preference for 1-[facility], 2-[facility], 3-[facility], 4-[facility]. CSW discussed insurance process and provided Medicare SNF ratings list. Patient has received COVID vaccines. CSW will send out referrals for review once oxygen levels are 5L or less.   Skilled Nursing Rehab Facilities-   [email or web address]   Ratings out of 5 possible   Name Address  Phone # [place] Inspection Overall  [facility] [address] [place] '4 5 2 3  '$ [facility]  [address] [place], [place] [phone] '3 2 5 5  '$ [facility] [place], [place] '3 1 1 1  '$ [place], [place] '3 2 4 4  '$ [facility] [address] [place] '1 1 2 1  '$ [place]. [address], [place] [phone] '2 1 4 3  '$ [facility] [place] [facility] [address] [place] '5 2 3 4  '$ [facility] [address], [place] '5 2 2 3  '$ [address] ([place]) [place], [place] [phone] '5 1 2 2  '$ [facility] [address], [place] [phone] '4 1 2 1  '$ [facility] [address], [facility] [phone] '4 1 2 1  '$ [facility]) [place]. [place], [place]  [phone] '4 1 1 1  '$ [facility] [address] [phone] '3 2 4 4          '$ [place] '4 2 3 3  '$ [facility] [place] [address] [place] '4 1 5 4  '$ [facility], [address], [place] [phone] '2 1 1 1  '$ [facility] [address], [address] [phone] '2 1 3 2          '$ [address] (no [facility]) [place] [address], [place] [phone] '4 5 5 5  '$ [facility] (No Hum[employer]) [address] [phone] [address], [place] '3 1 4 3  '$ [facility] (No UHC) [place], [place] [phone] '5 5 5 5  '$ [facility] [address] [phone] '3 2 4 4  '$ [place] [address] [place] '1 1 2 1  '$ [facility] [address] [place] '2 1 1 1  '$ [place], [place] '5 2 4 5  '$ [facility] [address] [place] '3 1 1 1  '$ [facility] [place], [place] '2 1 2 1          '$ [facility] [address], [place] [phone] '1 1 1 1  '$ [phone] [place]  [phone] '2 4 2 2  '$ [facility] [place] [address], [place] [phone] '5 2 3 4  '$ [place], [place] '2 1 1 1  '$ [place] (No Humana) [address] [place], [place] [phone] '2 1 3 2  '$ [facility] [address], [place] [phone] '3 1 1 1          '$ [facility] [place], [place] '5 4 5 5  '$ [facility])  [address], [place] '2 2 3 3  '$ [facility]) [place], [place] '3 2 4 4  '$ [place] [address] [place] '4 3 4 4  '$ [address] [place], [place] '3 3 1 1  '$ [facility]) [address] [place] [phone] '2 2 4 4      '$ Expected Discharge Plan: [place] Barriers to Discharge: Continued Medical Work up, SNF Pending bed offer  Expected Discharge Plan and Services Expected Discharge Plan: [place] In-house Referral: Clinical Social Work   Post Acute Care Choice: [place] Living arrangements for the past 2 months: [place]                                       Social Determinants of Health (SDOH) Interventions    Readmission Risk Interventions     No data to display

## 2022-03-27 NOTE — Progress Notes (Signed)
TRH night cross cover note:  I was notified by RN of patient's request for a different sleep aide.  Specifically, the patient reports insomnia refractory to prn dose of trazodone.  Patient also conveys that, historically, melatonin has been ineffective in managing his insomnia..   I subsequently placed order for one-time prn dose of oral Benadryl.    Babs Bertin, DO Hospitalist

## 2022-03-28 DIAGNOSIS — E039 Hypothyroidism, unspecified: Secondary | ICD-10-CM | POA: Diagnosis not present

## 2022-03-28 DIAGNOSIS — J9601 Acute respiratory failure with hypoxia: Secondary | ICD-10-CM | POA: Diagnosis not present

## 2022-03-28 DIAGNOSIS — I480 Paroxysmal atrial fibrillation: Secondary | ICD-10-CM | POA: Diagnosis not present

## 2022-03-28 DIAGNOSIS — J189 Pneumonia, unspecified organism: Secondary | ICD-10-CM | POA: Diagnosis not present

## 2022-03-28 LAB — PROCALCITONIN: Procalcitonin: 0.2 ng/mL

## 2022-03-28 LAB — CULTURE, BLOOD (ROUTINE X 2)
Culture: NO GROWTH
Culture: NO GROWTH
Special Requests: ADEQUATE
Special Requests: ADEQUATE

## 2022-03-28 LAB — BASIC METABOLIC PANEL
Anion gap: 10 (ref 5–15)
BUN: 20 mg/dL (ref 8–23)
CO2: 23 mmol/L (ref 22–32)
Calcium: 8 mg/dL — ABNORMAL LOW (ref 8.9–10.3)
Chloride: 104 mmol/L (ref 98–111)
Creatinine, Ser: 1.52 mg/dL — ABNORMAL HIGH (ref 0.61–1.24)
GFR, Estimated: 44 mL/min — ABNORMAL LOW (ref >60)
Glucose, Bld: 92 mg/dL (ref 70–99)
Potassium: 4 mmol/L (ref 3.5–5.1)
Sodium: 137 mmol/L (ref 135–145)

## 2022-03-28 LAB — CBC
HCT: 29.6 % — ABNORMAL LOW (ref 39.0–52.0)
Hemoglobin: 10 g/dL — ABNORMAL LOW (ref 13.0–17.0)
MCH: 34.6 pg — ABNORMAL HIGH (ref 26.0–34.0)
MCHC: 33.8 g/dL (ref 30.0–36.0)
MCV: 102.4 fL — ABNORMAL HIGH (ref 80.0–100.0)
Platelets: 585 10*3/uL — ABNORMAL HIGH (ref 150–400)
RBC: 2.89 MIL/uL — ABNORMAL LOW (ref 4.22–5.81)
RDW: 13.3 % (ref 11.5–15.5)
WBC: 11.4 10*3/uL — ABNORMAL HIGH (ref 4.0–10.5)
nRBC: 0 % (ref 0.0–0.2)

## 2022-03-28 LAB — GLUCOSE, CAPILLARY
Glucose-Capillary: 93 mg/dL (ref 70–99)
Glucose-Capillary: 149 mg/dL — ABNORMAL HIGH (ref 70–99)
Glucose-Capillary: 224 mg/dL — ABNORMAL HIGH (ref 70–99)
Glucose-Capillary: 294 mg/dL — ABNORMAL HIGH (ref 70–99)

## 2022-03-28 LAB — BRAIN NATRIURETIC PEPTIDE: B Natriuretic Peptide: 1219.1 pg/mL — ABNORMAL HIGH (ref 0.0–100.0)

## 2022-03-28 MED ORDER — FUROSEMIDE 10 MG/ML IJ SOLN
40.0000 mg | Freq: Two times a day (BID) | INTRAMUSCULAR | Status: DC
  Administered 2022-03-28: 40 mg via INTRAVENOUS
  Filled 2022-03-28: qty 4

## 2022-03-28 MED ORDER — METHYLPREDNISOLONE SODIUM SUCC 125 MG IJ SOLR
125.0000 mg | Freq: Every day | INTRAMUSCULAR | Status: DC
  Administered 2022-03-28 – 2022-03-29 (×2): 125 mg via INTRAVENOUS
  Filled 2022-03-28 (×2): qty 2

## 2022-03-28 NOTE — Progress Notes (Signed)
Pt did not wish to have CPT done at this time. Vitals stable. RT will continue to monitor.

## 2022-03-28 NOTE — Progress Notes (Signed)
PROGRESS NOTE    Jeno Calleros  ZOX:[id number] DOB: July 02, 1932 DOA: 03/26/2022 PCP: [name], NP   Brief Narrative:   Loron Weimer is a 88 y.o. male with medical history significant of CAD status post PCI 05/2019, ischemic cardiomyopathy, paroxysmal A-fib on Eliquis, hypertension, hyperlipidemia, mild cognitive impairment, type 2 diabetes, hypothyroidism, OSA, chronic cough, GERD, iron deficiency anemia due to chronic blood loss from AVMs and GI advised against repeat endoscopic procedures given his age. Recently seen in the ED on 6/6 for fevers, chills, and generalized weakness.  He was diagnosed with pneumonia (chest x-ray showing hazy right infrahilar opacity) and discharged on Augmentin and azithromycin.   He returned to the ED complaining of generalized weakness, shortness of breath, and cough.  SPO2 86-87% on room air and placed on 3 L supplemental oxygen.  Afebrile and not tachycardic or hypotensive.  Chest x-ray showing findings consistent with multifocal pneumonia. Patient was given vancomycin and cefepime.   Patient reports 1 week history of generalized weakness, cough, shortness of breath, and poor appetite.  States he feels so weak that he is barely able to get up and walk.  He does not use oxygen at home.  Reports chronic bilateral lower extremity edema for which she takes furosemide as needed if he gains weight.  He is vaccinated against COVID.  He is a retired Software engineer.  Assessment & Plan:   Principal Problem:   Multifocal pneumonia Active Problems:   Acquired hypothyroidism   Atrial fibrillation ([place])   Severe sepsis ([place])   Hypercholesterolemia   Acute respiratory failure with hypoxia ([facility])   Hypokalemia  Severe sepsis and acute hypoxic respiratory failure secondary to multifocal community-acquired pneumonia:  - Patient met severe sepsis criteria based on tachypnea and leukocytosis as well as acute hypoxia.  Evidence of pneumonia on the imaging study.  He failed  outpatient antibiotic therapy.  Urine antigen for Legionella but Streptococcus negative.  Lactic acid normal.  COVID negative.   -Screen MRSA is negative, stopped IV vancomycin. -Continue with IV cefepime, and IV azithromycin -Patient was encouraged with incentive spirometry and flutter valve. -Patient remains with significant oxygen requirement, he remains on 10 L oxygen via nasal cannula today, CT chest was obtained which did show multifocal pneumonia. -Improved oxygen requirement today, IV erythromycin has been added recently for atypical coverage, and he was started on IV Solu-Medrol as well, BNP remains significantly elevated so I will increase his Lasix to 40 mg IV twice daily  Acute on chronic combined systolic and diastolic congestive heart failure:  -Continue with IV diuresis, BNP is improving, but remains significantly elevated, will increase his Lasix to 40 mg IV twice daily.  Mild hypokalemia:  Replaced and resolved.  CAD s/p PCI/hyperlipidemia: No ACS symptoms.  Continue statin and aspirin.  Paroxysmal atrial fibrillation: Currently in sinus rhythm.  Continue amiodarone and Eliquis.  History of essential hypertension: Blood pressure on the low side.  Continue to hold losartan.   CKD stage IIIa: At baseline.  Non-insulin-dependent type 2 diabetes mellitus:  - Resumed Jardiance.  Continue SSI.  Blood sugar controlled.  Acquired hypothyroidism: Continue Synthroid.  GERD: Continue PPI.  Chronic blood loss anemia/iron deficiency anemia: Due to chronic blood loss from AVMs and GI advised against repeat endoscopic procedures given his age.  He is followed by hematology and continues to be on Eliquis for A-fib.  Hemoglobin currently 10.7, was 11.4 on 03/17/2022. -Continue iron supplement -Continue to monitor hemoglobin  Generalized weakness: PT OT consulted.  DVT prophylaxis:  apixaban (ELIQUIS) tablet 2.5 mg Start: 03/23/22 1000   Code Status: DNR  Family Communication:  Discussed with daughter at bedside Status is: Inpatient Remains inpatient appropriate because: Patient sick with significant hypoxia  Estimated body mass index is 24.4 kg/m as calculated from the following:   Height as of this encounter: 5' 8" (1.727 m).   Weight as of this encounter: 72.8 kg.    Nutritional Assessment: Body mass index is 24.4 kg/m.Marland Kitchen Seen by dietician.  I agree with the assessment and plan as outlined below: Nutrition Status:        . Skin Assessment: I have examined the patient's skin and I agree with the wound assessment as performed by the wound care RN as outlined below:    Consultants:  None  Procedures:  None  Antimicrobials:  Anti-infectives (From admission, onward)    Start     Dose/Rate Route Frequency Ordered Stop   03/26/22 1730  azithromycin (ZITHROMAX) 500 mg in sodium chloride 0.9 % 250 mL IVPB        500 mg 250 mL/hr over 60 Minutes Intravenous Every 24 hours 03/26/22 1650     03/24/22 0500  vancomycin (VANCOREADY) IVPB 750 mg/150 mL  Status:  Discontinued        750 mg 150 mL/hr over 60 Minutes Intravenous Every 24 hours 03/23/22 0515 03/24/22 1051   03/23/22 1600  ceFEPIme (MAXIPIME) 2 g in sodium chloride 0.9 % 100 mL IVPB        2 g 200 mL/hr over 30 Minutes Intravenous Every 12 hours 03/23/22 0515     03/23/22 0315  ceFEPIme (MAXIPIME) 2 g in sodium chloride 0.9 % 100 mL IVPB        2 g 200 mL/hr over 30 Minutes Intravenous  Once 03/23/22 0307 03/23/22 0459   03/23/22 0315  vancomycin (VANCOREADY) IVPB 1500 mg/300 mL        1,500 mg 150 mL/hr over 120 Minutes Intravenous  Once 03/23/22 0307 03/23/22 0724         Subjective:  No nausea, no vomiting, cough has significantly improved, dyspnea improved as well.  Objective: Vitals:   03/28/22 0400 03/28/22 0500 03/28/22 0800 03/28/22 1200  BP: (!) 117/52  (!) 114/45 (!) 116/49  Pulse: 94   83  Resp:   12 12  Temp:   98 F (36.7 C) 98 F (36.7 C)  TempSrc:   Oral Oral   SpO2: 92%  100% 98%  Weight:  72.8 kg    Height:        Intake/Output Summary (Last 24 hours) at 03/28/2022 1502 Last data filed at 03/28/2022 1325 Gross per 24 hour  Intake 580 ml  Output 1500 ml  Net -920 ml   Filed Weights   03/25/22 0500 03/27/22 0202 03/28/22 0500  Weight: 74.8 kg 74.3 kg 72.8 kg    Examination:  Awake Alert, Oriented X 3, No new F.N deficits, Normal affect, deconditioned, frail Symmetrical Chest wall movement, scattered Rales RRR,No Gallops,Rubs or new Murmurs, No Parasternal Heave +ve B.Sounds, Abd Soft, No tenderness, No rebound - guarding or rigidity. No Cyanosis, Clubbing or edema, No new Rash or bruise       Data Reviewed: I have personally reviewed following labs and imaging studies  CBC: Recent Labs  Lab 03/12/2022 1903 03/23/22 0624 03/24/22 0444 03/26/22 0120 03/27/22 0049 03/28/22 0043  WBC 13.1* 12.8* 17.3* 11.9* 13.2* 11.4*  NEUTROABS 10.3*  --  15.0*  --   --   --  HGB 10.7* 12.1* 10.4* 10.0* 10.1* 10.0*  HCT 32.3* 35.9* 31.4* 30.1* 31.0* 29.6*  MCV 104.9* 103.5* 103.3* 101.0* 103.7* 102.4*  PLT 467* 440* 511* 567* 522* 122*   Basic Metabolic Panel: Recent Labs  Lab 03/23/22 0624 03/24/22 0444 03/25/22 1322 03/26/22 0120 03/27/22 0049 03/28/22 0043  NA 139 140 139 139 140 137  K 3.6 4.2 3.4* 3.8 4.0 4.0  CL 107 108 104 105 104 104  CO2 21* _0 GLUCOSE 110* 133* 114* 99 115* 92  BUN _1 CREATININE 1.33* 1.33* 1.40* 1.38* 1.41* 1.52*  CALCIUM 8.0* 8.2* 8.0* 8.0* 8.3* 8.0*  MG 2.5*  --   --  2.3  --   --    GFR: Estimated Creatinine Clearance: 32.5 mL/min (A) (by C-G formula based on SCr of 1.52 mg/dL (H)). Liver Function Tests: Recent Labs  Lab 03/28/2022 1903  AST 55*  ALT 54*  ALKPHOS 53  BILITOT 0.8  PROT 5.9*  ALBUMIN 2.6*   No results for input(s): "LIPASE", "AMYLASE" in the last 168 hours. No results for input(s): "AMMONIA" in the last 168 hours. Coagulation Profile: No  results for input(s): "INR", "PROTIME" in the last 168 hours. Cardiac Enzymes: No results for input(s): "CKTOTAL", "CKMB", "CKMBINDEX", "TROPONINI" in the last 168 hours. BNP (last 3 results) No results for input(s): "PROBNP" in the last 8760 hours. HbA1C: No results for input(s): "HGBA1C" in the last 72 hours.  CBG: Recent Labs  Lab 03/27/22 1237 03/27/22 1607 03/27/22 2219 03/28/22 0831 03/28/22 1139  GLUCAP 105* 109* 120* 93 149*   Lipid Profile: No results for input(s): "CHOL", "HDL", "LDLCALC", "TRIG", "CHOLHDL", "LDLDIRECT" in the last 72 hours. Thyroid Function Tests: No results for input(s): "TSH", "T4TOTAL", "FREET4", "T3FREE", "THYROIDAB" in the last 72 hours. Anemia Panel: No results for input(s): "VITAMINB12", "FOLATE", "FERRITIN", "TIBC", "IRON", "RETICCTPCT" in the last 72 hours. Sepsis Labs: Recent Labs  Lab 03/23/22 0349 03/23/22 1121 03/24/22 0444 03/27/22 0049 03/28/22 0043  PROCALCITON  --  <0.10 0.19 0.30 0.20  LATICACIDVEN 1.7  --   --   --   --     Recent Results (from the past 240 hour(s))  Blood culture (routine x 2)     Status: None   Collection Time: 03/23/22  3:44 AM   Specimen: BLOOD  Result Value Ref Range Status   Specimen Description BLOOD SITE NOT SPECIFIED  Final   Special Requests   Final    BOTTLES DRAWN AEROBIC AND ANAEROBIC Blood Culture adequate volume   Culture   Final    NO GROWTH 5 DAYS Performed at [facility], [address], [place], [place] [address]    Report Status 03/28/2022 FINAL  Final  Blood culture (routine x 2)     Status: None   Collection Time: 03/23/22  3:49 AM   Specimen: BLOOD  Result Value Ref Range Status   Specimen Description BLOOD SITE NOT SPECIFIED  Final   Special Requests   Final    BOTTLES DRAWN AEROBIC AND ANAEROBIC Blood Culture adequate volume   Culture   Final    NO GROWTH 5 DAYS Performed at [facility], [place] [address] [place], [place] [address]    Report Status  03/28/2022 FINAL  Final  SARS Coronavirus 2 by RT PCR (hospital order, performed in [facility] hospital lab) *cepheid single result test*     Status: None   Collection Time: 03/23/22  9:27 AM  Result Value Ref Range Status   SARS Coronavirus 2 by RT PCR NEGATIVE NEGATIVE Final    Comment: (NOTE) SARS-CoV-2 target nucleic acids are NOT DETECTED.  The SARS-CoV-2 RNA is generally detectable in upper and lower respiratory specimens during the acute phase of infection. The lowest concentration of SARS-CoV-2 viral copies this assay can detect is 250 copies / mL. A negative result does not preclude SARS-CoV-2 infection and should not be used as the sole basis for treatment or other patient management decisions.  A negative result may occur with improper specimen collection / handling, submission of specimen other than nasopharyngeal swab, presence of viral mutation(s) within the areas targeted by this assay, and inadequate number of viral copies (<250 copies / mL). A negative result must be combined with clinical observations, patient history, and epidemiological information.  Fact Sheet for Patients:   https://www.patel.info/  Fact Sheet for Healthcare Providers: https://hall.com/  This test is not yet approved or  cleared by the [place] FDA and has been authorized for detection and/or diagnosis of SARS-CoV-2 by FDA under an Emergency Use Authorization (EUA).  This EUA will remain in effect (meaning this test can be used) for the duration of the COVID-19 declaration under Section 564(b)(1) of the Act, 21 U.S.C. section 360bbb-3(b)(1), unless the authorization is terminated or revoked sooner.  Performed at [facility], [place] [address] [place], [place] [address]   MRSA Next Gen by PCR, Nasal     Status: None   Collection Time: 03/23/22  9:27 AM  Result Value Ref Range Status   MRSA by PCR Next Gen NOT DETECTED NOT DETECTED Final     Comment: (NOTE) The GeneXpert MRSA Assay (FDA approved for NASAL specimens only), is one component of a comprehensive MRSA colonization surveillance program. It is not intended to diagnose MRSA infection nor to guide or monitor treatment for MRSA infections. Test performance is not FDA approved in patients less than 53 years old. Performed at [facility], [place] [address] [place], [place] [address]      Radiology Studies: CT CHEST WO CONTRAST  Result Date: 03/27/2022 CLINICAL DATA:  Hypoxia EXAM: CT CHEST WITHOUT CONTRAST TECHNIQUE: Multidetector CT imaging of the chest was performed following the standard protocol without IV contrast. RADIATION DOSE REDUCTION: This exam was performed according to the departmental dose-optimization program which includes automated exposure control, adjustment of the mA and/or kV according to patient size and/or use of iterative reconstruction technique. COMPARISON:  Chest x-ray 03/26/2022, CT 01/05/2022 FINDINGS: Cardiovascular: Mild cardiomegaly. No pericardial effusion. Thoracic aorta is nonaneurysmal. Atherosclerotic calcification of the aorta and coronary arteries. Main pulmonary trunk is mildly dilated. Mediastinum/Nodes: Interval development of numerous mildly prominent mediastinal and bilateral hilar lymph nodes including 10 mm upper right paratracheal node (series 3, image 39). No axillary lymphadenopathy. Thyroid, trachea, and esophagus demonstrate no significant findings. Lungs/Pleura: Multifocal bilateral airspace consolidations, most confluent within the right upper lobe with air bronchograms. Predominantly ground-glass opacities within the remaining lung fields. Moderate right and small left pleural effusions. Dependent left lower lobe atelectasis. No pneumothorax. Upper Abdomen: No acute abnormality. Musculoskeletal: Chronic mild superior endplate compression fractures of T2 and T3, unchanged. Severe degenerative disc disease of C6-7. Severe  bilateral glenohumeral joint arthropathy. No new or acute bony findings. No chest wall abnormality. IMPRESSION: 1. Appearance compatible with multifocal pneumonia, most confluent within the right upper lobe. 2. Moderate right and small left pleural effusions. 3. Interval development of numerous mildly prominent mediastinal  and bilateral hilar lymph nodes, likely reactive. 4. Aortic and coronary artery atherosclerosis (ICD10-I70.0). 5. Mildly dilated main pulmonary trunk, which can be seen in the setting of pulmonary arterial hypertension. Electronically Signed   By: Davina Poke D.O.   On: 03/27/2022 10:12   DG Chest Port 1 View  Result Date: 03/26/2022 CLINICAL DATA:  Shortness of breath, hypoxia, pneumonia EXAM: PORTABLE CHEST 1 VIEW COMPARISON:  Previous studies including the examination of 03/24/2022 FINDINGS: Transverse diameter of heart is increased. There is interval decrease in interstitial markings in the parahilar regions. There are patchy infiltrates in the right upper lung fields right lower lung fields and both parahilar regions. There is slight interval decrease in infiltrate in the right upper lung fields and slight worsening of infiltrate in the right lower lung fields. Lateral CP angles are clear. There is no pneumothorax. Degenerative changes are noted in both shoulders, more severe on the right side. IMPRESSION: Infiltrates are seen in both lungs with interval slight decrease in infiltrate in the right upper lung fields and slight worsening of infiltrate in the right lower lung fields. Findings suggest multifocal pneumonia involving both lungs. There is decrease in interstitial markings in the parahilar regions suggesting possible resolving interstitial pulmonary edema. Electronically Signed   By: Elmer Picker M.D.   On: 03/26/2022 16:17    Scheduled Meds:  amiodarone  200 mg Oral Daily   apixaban  2.5 mg Oral BID   aspirin  81 mg Oral Daily   empagliflozin  10 mg Oral QAC  breakfast   ferrous sulfate  325 mg Oral Q breakfast   furosemide  40 mg Intravenous Daily   guaiFENesin  600 mg Oral BID   insulin aspart  0-5 Units Subcutaneous QHS   insulin aspart  0-9 Units Subcutaneous TID WC   levothyroxine  88 mcg Oral Q0600   loratadine  10 mg Oral Daily   melatonin  5 mg Oral QHS   methylPREDNISolone (SOLU-MEDROL) injection  125 mg Intravenous Daily   montelukast  10 mg Oral Daily   pantoprazole  40 mg Oral Daily   rosuvastatin  20 mg Oral Daily   Continuous Infusions:  azithromycin 500 mg (03/27/22 1558)   ceFEPime (MAXIPIME) IV 2 g (03/28/22 0804)     LOS: 5 days   Phillips Climes, MD [facility]  03/28/2022, 3:02 PM

## 2022-03-28 NOTE — Plan of Care (Signed)
  Problem: Education: Goal: Ability to describe self-care measures that may prevent or decrease complications (Diabetes Survival Skills Education) will improve Outcome: Progressing Goal: Individualized Educational Video(s) Outcome: Progressing   Problem: Coping: Goal: Ability to adjust to condition or change in health will improve Outcome: Progressing   Problem: Health Behavior/Discharge Planning: Goal: Ability to identify and utilize available resources and services will improve Outcome: Progressing Goal: Ability to manage health-related needs will improve Outcome: Progressing   Problem: Metabolic: Goal: Ability to maintain appropriate glucose levels will improve Outcome: Progressing   Problem: Nutritional: Goal: Maintenance of adequate nutrition will improve Outcome: Progressing Goal: Progress toward achieving an optimal weight will improve Outcome: Progressing   Problem: Skin Integrity: Goal: Risk for impaired skin integrity will decrease Outcome: Progressing   Problem: Tissue Perfusion: Goal: Adequacy of tissue perfusion will improve Outcome: Progressing   Problem: Education: Goal: Knowledge of General Education information will improve Description: Including pain rating scale, medication(s)/side effects and non-pharmacologic comfort measures Outcome: Progressing   Problem: Health Behavior/Discharge Planning: Goal: Ability to manage health-related needs will improve Outcome: Progressing   Problem: Clinical Measurements: Goal: Ability to maintain clinical measurements within normal limits will improve Outcome: Progressing Goal: Will remain free from infection Outcome: Progressing Goal: Diagnostic test results will improve Outcome: Progressing Goal: Respiratory complications will improve Outcome: Progressing Goal: Cardiovascular complication will be avoided Outcome: Progressing   Problem: Activity: Goal: Risk for activity intolerance will decrease Outcome:  Progressing   Problem: Nutrition: Goal: Adequate nutrition will be maintained Outcome: Progressing   Problem: Coping: Goal: Level of anxiety will decrease Outcome: Progressing   Problem: Elimination: Goal: Will not experience complications related to bowel motility Outcome: Progressing Goal: Will not experience complications related to urinary retention Outcome: Progressing   Problem: Pain Managment: Goal: General experience of comfort will improve Outcome: Progressing   Problem: Safety: Goal: Ability to remain free from injury will improve Outcome: Progressing   Problem: Skin Integrity: Goal: Risk for impaired skin integrity will decrease Outcome: Progressing

## 2022-03-29 ENCOUNTER — Inpatient Hospital Stay (HOSPITAL_COMMUNITY): Payer: Medicare Other

## 2022-03-29 DIAGNOSIS — E7800 Pure hypercholesterolemia, unspecified: Secondary | ICD-10-CM

## 2022-03-29 DIAGNOSIS — J9601 Acute respiratory failure with hypoxia: Secondary | ICD-10-CM | POA: Diagnosis not present

## 2022-03-29 DIAGNOSIS — I472 Ventricular tachycardia, unspecified: Secondary | ICD-10-CM

## 2022-03-29 DIAGNOSIS — I5042 Chronic combined systolic (congestive) and diastolic (congestive) heart failure: Secondary | ICD-10-CM

## 2022-03-29 DIAGNOSIS — I480 Paroxysmal atrial fibrillation: Secondary | ICD-10-CM | POA: Diagnosis not present

## 2022-03-29 DIAGNOSIS — I5023 Acute on chronic systolic (congestive) heart failure: Secondary | ICD-10-CM | POA: Diagnosis not present

## 2022-03-29 DIAGNOSIS — E039 Hypothyroidism, unspecified: Secondary | ICD-10-CM | POA: Diagnosis not present

## 2022-03-29 DIAGNOSIS — J189 Pneumonia, unspecified organism: Secondary | ICD-10-CM | POA: Diagnosis not present

## 2022-03-29 DIAGNOSIS — I4729 Other ventricular tachycardia: Secondary | ICD-10-CM

## 2022-03-29 LAB — CBC
HCT: 31.7 % — ABNORMAL LOW (ref 39.0–52.0)
Hemoglobin: 10.4 g/dL — ABNORMAL LOW (ref 13.0–17.0)
MCH: 33.5 pg (ref 26.0–34.0)
MCHC: 32.8 g/dL (ref 30.0–36.0)
MCV: 102.3 fL — ABNORMAL HIGH (ref 80.0–100.0)
Platelets: 708 10*3/uL — ABNORMAL HIGH (ref 150–400)
RBC: 3.1 MIL/uL — ABNORMAL LOW (ref 4.22–5.81)
RDW: 13.2 % (ref 11.5–15.5)
WBC: 12.8 10*3/uL — ABNORMAL HIGH (ref 4.0–10.5)
nRBC: 0 % (ref 0.0–0.2)

## 2022-03-29 LAB — ECHOCARDIOGRAM LIMITED
Calc EF: 20.5 %
Height: 68 in
MV M vel: 5.04 m/s
MV Peak grad: 101.6 mmHg
P 1/2 time: 386 msec
S' Lateral: 5.5 cm
Single Plane A2C EF: 30 %
Single Plane A4C EF: 13.3 %
Weight: 2571.45 oz

## 2022-03-29 LAB — MAGNESIUM: Magnesium: 2.5 mg/dL — ABNORMAL HIGH (ref 1.7–2.4)

## 2022-03-29 LAB — GLUCOSE, CAPILLARY
Glucose-Capillary: 154 mg/dL — ABNORMAL HIGH (ref 70–99)
Glucose-Capillary: 248 mg/dL — ABNORMAL HIGH (ref 70–99)
Glucose-Capillary: 199 mg/dL — ABNORMAL HIGH (ref 70–99)

## 2022-03-29 LAB — BASIC METABOLIC PANEL
Anion gap: 15 (ref 5–15)
BUN: 33 mg/dL — ABNORMAL HIGH (ref 8–23)
CO2: 23 mmol/L (ref 22–32)
Calcium: 8.5 mg/dL — ABNORMAL LOW (ref 8.9–10.3)
Chloride: 103 mmol/L (ref 98–111)
Creatinine, Ser: 1.72 mg/dL — ABNORMAL HIGH (ref 0.61–1.24)
GFR, Estimated: 38 mL/min — ABNORMAL LOW (ref >60)
Glucose, Bld: 130 mg/dL — ABNORMAL HIGH (ref 70–99)
Potassium: 3.5 mmol/L (ref 3.5–5.1)
Sodium: 141 mmol/L (ref 135–145)

## 2022-03-29 LAB — TROPONIN I (HIGH SENSITIVITY)
Troponin I (High Sensitivity): 121 ng/L (ref <18)
Troponin I (High Sensitivity): 425 ng/L (ref <18)

## 2022-03-29 MED ORDER — CHLORHEXIDINE GLUCONATE CLOTH 2 % EX PADS
6.0000 | MEDICATED_PAD | Freq: Every day | CUTANEOUS | Status: DC
  Administered 2022-03-29 – 2022-03-31 (×3): 6 via TOPICAL

## 2022-03-29 MED ORDER — AMIODARONE HCL IN DEXTROSE 360-4.14 MG/200ML-% IV SOLN
30.0000 mg/h | INTRAVENOUS | Status: DC
  Administered 2022-03-29 – 2022-03-30 (×4): 30 mg/h via INTRAVENOUS
  Filled 2022-03-29 (×5): qty 200

## 2022-03-29 MED ORDER — MAGNESIUM SULFATE 2 GM/50ML IV SOLN
INTRAVENOUS | Status: AC
  Administered 2022-03-29: 2 g
  Filled 2022-03-29: qty 50

## 2022-03-29 MED ORDER — METHYLPREDNISOLONE SODIUM SUCC 40 MG IJ SOLR
40.0000 mg | Freq: Every day | INTRAMUSCULAR | Status: DC
  Administered 2022-03-30 – 2022-03-31 (×2): 40 mg via INTRAVENOUS
  Filled 2022-03-29 (×2): qty 1

## 2022-03-29 MED ORDER — POTASSIUM CHLORIDE CRYS ER 20 MEQ PO TBCR
40.0000 meq | EXTENDED_RELEASE_TABLET | Freq: Once | ORAL | Status: AC
  Administered 2022-03-29: 40 meq via ORAL
  Filled 2022-03-29: qty 2

## 2022-03-29 MED ORDER — FUROSEMIDE 10 MG/ML IJ SOLN
40.0000 mg | Freq: Every day | INTRAMUSCULAR | Status: DC
Start: 2022-03-30 — End: 2022-03-31
  Administered 2022-03-30: 40 mg via INTRAVENOUS
  Filled 2022-03-29 (×2): qty 4

## 2022-03-29 MED ORDER — METOPROLOL TARTRATE 25 MG PO TABS
25.0000 mg | ORAL_TABLET | Freq: Two times a day (BID) | ORAL | Status: DC
  Administered 2022-03-29 – 2022-03-31 (×4): 25 mg via ORAL
  Filled 2022-03-29 (×4): qty 1

## 2022-03-29 MED ORDER — AMIODARONE LOAD VIA INFUSION
150.0000 mg | Freq: Once | INTRAVENOUS | Status: AC
  Administered 2022-03-29: 150 mg via INTRAVENOUS
  Filled 2022-03-29: qty 83.34

## 2022-03-29 MED ORDER — AMIODARONE HCL IN DEXTROSE 360-4.14 MG/200ML-% IV SOLN
60.0000 mg/h | INTRAVENOUS | Status: AC
  Administered 2022-03-29: 60 mg/h via INTRAVENOUS
  Filled 2022-03-29: qty 200

## 2022-03-29 MED ORDER — MAGNESIUM SULFATE 2 GM/50ML IV SOLN: 2.0000 g | Freq: Once | INTRAVENOUS | Status: DC

## 2022-03-29 MED ORDER — SODIUM CHLORIDE 0.9 % IV SOLN
2.0000 g | INTRAVENOUS | Status: DC
Start: 2022-03-30 — End: 2022-03-31
  Administered 2022-03-30 – 2022-03-31 (×2): 2 g via INTRAVENOUS
  Filled 2022-03-29 (×2): qty 12.5

## 2022-03-29 NOTE — Progress Notes (Signed)
CPT not done at this time. Patient having issues with HR. MD and RN at bedside.

## 2022-03-29 NOTE — Progress Notes (Signed)
PROGRESS NOTE    Khalid Lacko  KXF:[id number] DOB: 1931-12-17 DOA: 03/31/2022 PCP: [name], NP   Brief Narrative:   Ronnie Joseph is a 88 y.o. male with medical history significant of CAD status post PCI 05/2019, ischemic cardiomyopathy, paroxysmal A-fib on Eliquis, hypertension, hyperlipidemia, mild cognitive impairment, type 2 diabetes, hypothyroidism, OSA, chronic cough, GERD, iron deficiency anemia due to chronic blood loss from AVMs and GI advised against repeat endoscopic procedures given his age. Recently seen in the ED on 6/6 for fevers, chills, and generalized weakness.  He was diagnosed with pneumonia (chest x-ray showing hazy right infrahilar opacity) and discharged on Augmentin and azithromycin.   He returned to the ED complaining of generalized weakness, shortness of breath, and cough.  SPO2 86-87% on room air and placed on 3 L supplemental oxygen.  Afebrile and not tachycardic or hypotensive.  Chest x-ray showing findings consistent with multifocal pneumonia. Patient was given vancomycin and cefepime.   Patient reports 1 week history of generalized weakness, cough, shortness of breath, and poor appetite.  States he feels so weak that he is barely able to get up and walk.  He does not use oxygen at home.  Reports chronic bilateral lower extremity edema for which she takes furosemide as needed if he gains weight.  He is vaccinated against COVID.  He is a retired Software engineer.  Assessment & Plan:   Principal Problem:   Multifocal pneumonia Active Problems:   Acquired hypothyroidism   Atrial fibrillation ([place])   Severe sepsis ([place])   Hypercholesterolemia   Acute respiratory failure with hypoxia ([facility])   Hypokalemia  Severe sepsis and acute hypoxic respiratory failure secondary to multifocal community-acquired pneumonia:  - Patient met severe sepsis criteria based on tachypnea and leukocytosis as well as acute hypoxia.  Evidence of pneumonia on the imaging study.  He failed  outpatient antibiotic therapy.  Urine antigen for Legionella but Streptococcus negative.  Lactic acid normal.  COVID negative.   -Screen MRSA is negative, stopped IV vancomycin. -Continue with IV cefepime, and IV azithromycin -Patient was encouraged with incentive spirometry and flutter valve. -Patient remains with significant oxygen requirement, he remains on 10 L oxygen via nasal cannula today, CT chest was obtained which did show multifocal pneumonia. -improving oxygen requirement, is currently on 3 to 4 L nasal cannula.   Acute on chronic combined systolic and diastolic congestive heart failure:  -BNP significantly elevated, on IV Lasix 40 mg twice daily, given elevation in creatinine this morning it has been held, and will resume at once daily tomorrow .  Multiple episodes of NSVT -Patient had multiple episodes of prolonged NSVT's, potassium 3.5, will replete, will give 2 g of magnesium currently, and will add museum level to the labs which were done earlier today. -He is on amiodarone for A-fib, will change to amiodarone drip. -Most recent echo was October 2022 with EF 40 to 45%, will await further cardiology recommendation before repeating.  Mild hypokalemia:  Replaced and resolved.  CAD s/p PCI/hyperlipidemia: No ACS symptoms.  Continue statin and aspirin.  Paroxysmal atrial fibrillation: Currently in sinus rhythm.  Continue amiodarone and Eliquis.  History of essential hypertension: Blood pressure on the low side.  Continue to hold losartan.   CKD stage IIIa: At baseline.  Non-insulin-dependent type 2 diabetes mellitus:  - Resumed Jardiance.  Continue SSI.  Blood sugar controlled.  Acquired hypothyroidism: Continue Synthroid.  GERD: Continue PPI.  Chronic blood loss anemia/iron deficiency anemia: Due to chronic blood loss from AVMs and  GI advised against repeat endoscopic procedures given his age.  He is followed by hematology and continues to be on Eliquis for A-fib.   Hemoglobin currently 10.7, was 11.4 on 03/17/2022. -Continue iron supplement -Continue to monitor hemoglobin  Generalized weakness: PT OT consulted.  DVT prophylaxis: apixaban (ELIQUIS) tablet 2.5 mg Start: 03/23/22 1000   Code Status: DNR  Family Communication: Discussed with daughter at bedside 6/17 Status is: Inpatient Remains inpatient appropriate because: Patient sick with significant hypoxia  Estimated body mass index is 24.44 kg/m as calculated from the following:   Height as of this encounter: _0  (1.727 m).   Weight as of this encounter: 72.9 kg.    Nutritional Assessment: Body mass index is 24.44 kg/m.Marland Kitchen Seen by dietician.  I agree with the assessment and plan as outlined below: Nutrition Status:        . Skin Assessment: I have examined the patient's skin and I agree with the wound assessment as performed by the wound care RN as outlined below:    Consultants:  cardiology  Procedures:  None  Antimicrobials:  Anti-infectives (From admission, onward)    Start     Dose/Rate Route Frequency Ordered Stop   03/30/22 0822  ceFEPIme (MAXIPIME) 2 g in sodium chloride 0.9 % 100 mL IVPB        2 g 200 mL/hr over 30 Minutes Intravenous Every 24 hours 03/29/22 0841     03/26/22 1730  azithromycin (ZITHROMAX) 500 mg in sodium chloride 0.9 % 250 mL IVPB        500 mg 250 mL/hr over 60 Minutes Intravenous Every 24 hours 03/26/22 1650     03/24/22 0500  vancomycin (VANCOREADY) IVPB 750 mg/150 mL  Status:  Discontinued        750 mg 150 mL/hr over 60 Minutes Intravenous Every 24 hours 03/23/22 0515 03/24/22 1051   03/23/22 1600  ceFEPIme (MAXIPIME) 2 g in sodium chloride 0.9 % 100 mL IVPB  Status:  Discontinued        2 g 200 mL/hr over 30 Minutes Intravenous Every 12 hours 03/23/22 0515 03/29/22 0841   03/23/22 0315  ceFEPIme (MAXIPIME) 2 g in sodium chloride 0.9 % 100 mL IVPB        2 g 200 mL/hr over 30 Minutes Intravenous  Once 03/23/22 0307 03/23/22 0459    03/23/22 0315  vancomycin (VANCOREADY) IVPB 1500 mg/300 mL        1,500 mg 150 mL/hr over 120 Minutes Intravenous  Once 03/23/22 0307 03/23/22 0724         Subjective:  Nausea, no vomiting, cough has improved, reports he is having significant productive phlegm currently, dyspnea at baseline, and afternoon he reports some sweating episodes while he had NSVT on telemetry monitor.  He denied any chest pain during these events  Objective: Vitals:   03/28/22 2000 03/28/22 2304 03/29/22 0321 03/29/22 0500  BP: (!) 110/49 (!) 108/95 (!) 115/48   Pulse:  71 72   Resp: _1 Temp: 97.8 F (36.6 C) 98.2 F (36.8 C) 97.9 F (36.6 C)   TempSrc: Oral Oral Oral   SpO2: (!) 89% 90% 92%   Weight:    72.9 kg  Height:        Intake/Output Summary (Last 24 hours) at 03/29/2022 1452 Last data filed at 03/28/2022 2304 Gross per 24 hour  Intake 579.43 ml  Output 900 ml  Net -320.57 ml   Filed Weights   03/27/22 0202  03/28/22 0500 03/29/22 0500  Weight: 74.3 kg 72.8 kg 72.9 kg    Examination:  Awake Alert, Oriented X 3, No new F.N deficits, Normal affect, deconditioned, frail Symmetrical Chest wall movement, Good air movement bilaterally, CTAB RRR,No Gallops,Rubs or new Murmurs, No Parasternal Heave +ve B.Sounds, Abd Soft, No tenderness, No rebound - guarding or rigidity. No Cyanosis, Clubbing or edema, No new Rash or bruise        Data Reviewed: I have personally reviewed following labs and imaging studies  CBC: Recent Labs  Lab 03/17/2022 1903 03/23/22 0624 03/24/22 0444 03/26/22 0120 03/27/22 0049 03/28/22 0043 03/29/22 0107  WBC 13.1*   < > 17.3* 11.9* 13.2* 11.4* 12.8*  NEUTROABS 10.3*  --  15.0*  --   --   --   --   HGB 10.7*   < > 10.4* 10.0* 10.1* 10.0* 10.4*  HCT 32.3*   < > 31.4* 30.1* 31.0* 29.6* 31.7*  MCV 104.9*   < > 103.3* 101.0* 103.7* 102.4* 102.3*  PLT 467*   < > 511* 567* 522* 585* 708*   < > = values in this interval not displayed.   Basic  Metabolic Panel: Recent Labs  Lab 03/23/22 0624 03/24/22 0444 03/25/22 1322 03/26/22 0120 03/27/22 0049 03/28/22 0043 03/29/22 0107  NA 139   < > 139 139 140 137 141  K 3.6   < > 3.4* 3.8 4.0 4.0 3.5  CL 107   < > 104 105 104 104 103  CO2 21*   < > _0 GLUCOSE 110*   < > 114* 99 115* 92 130*  BUN 15   < > _1 33*  CREATININE 1.33*   < > 1.40* 1.38* 1.41* 1.52* 1.72*  CALCIUM 8.0*   < > 8.0* 8.0* 8.3* 8.0* 8.5*  MG 2.5*  --   --  2.3  --   --   --    < > = values in this interval not displayed.   GFR: Estimated Creatinine Clearance: 28.7 mL/min (A) (by C-G formula based on SCr of 1.72 mg/dL (H)). Liver Function Tests: Recent Labs  Lab 03/17/2022 1903  AST 55*  ALT 54*  ALKPHOS 53  BILITOT 0.8  PROT 5.9*  ALBUMIN 2.6*   No results for input(s): "LIPASE", "AMYLASE" in the last 168 hours. No results for input(s): "AMMONIA" in the last 168 hours. Coagulation Profile: No results for input(s): "INR", "PROTIME" in the last 168 hours. Cardiac Enzymes: No results for input(s): "CKTOTAL", "CKMB", "CKMBINDEX", "TROPONINI" in the last 168 hours. BNP (last 3 results) No results for input(s): "PROBNP" in the last 8760 hours. HbA1C: No results for input(s): "HGBA1C" in the last 72 hours.  CBG: Recent Labs  Lab 03/28/22 1139 03/28/22 1538 03/28/22 2127 03/29/22 0811 03/29/22 1226  GLUCAP 149* 224* 294* 154* 248*   Lipid Profile: No results for input(s): "CHOL", "HDL", "LDLCALC", "TRIG", "CHOLHDL", "LDLDIRECT" in the last 72 hours. Thyroid Function Tests: No results for input(s): "TSH", "T4TOTAL", "FREET4", "T3FREE", "THYROIDAB" in the last 72 hours. Anemia Panel: No results for input(s): "VITAMINB12", "FOLATE", "FERRITIN", "TIBC", "IRON", "RETICCTPCT" in the last 72 hours. Sepsis Labs: Recent Labs  Lab 03/23/22 0349 03/23/22 1121 03/24/22 0444 03/27/22 0049 03/28/22 0043  PROCALCITON  --  <0.10 0.19 0.30 0.20  LATICACIDVEN 1.7  --   --   --   --      Recent Results (from the past 240 hour(s))  Blood culture (routine  x 2)     Status: None   Collection Time: 03/23/22  3:44 AM   Specimen: BLOOD  Result Value Ref Range Status   Specimen Description BLOOD SITE NOT SPECIFIED  Final   Special Requests   Final    BOTTLES DRAWN AEROBIC AND ANAEROBIC Blood Culture adequate volume   Culture   Final    NO GROWTH 5 DAYS Performed at [facility], [address], [place], [place] [address]    Report Status 03/28/2022 FINAL  Final  Blood culture (routine x 2)     Status: None   Collection Time: 03/23/22  3:49 AM   Specimen: BLOOD  Result Value Ref Range Status   Specimen Description BLOOD SITE NOT SPECIFIED  Final   Special Requests   Final    BOTTLES DRAWN AEROBIC AND ANAEROBIC Blood Culture adequate volume   Culture   Final    NO GROWTH 5 DAYS Performed at [facility], [place] [address] [place], [place] [address]    Report Status 03/28/2022 FINAL  Final  SARS Coronavirus 2 by RT PCR (hospital order, performed in [facility] hospital lab) *cepheid single result test*     Status: None   Collection Time: 03/23/22  9:27 AM  Result Value Ref Range Status   SARS Coronavirus 2 by RT PCR NEGATIVE NEGATIVE Final    Comment: (NOTE) SARS-CoV-2 target nucleic acids are NOT DETECTED.  The SARS-CoV-2 RNA is generally detectable in upper and lower respiratory specimens during the acute phase of infection. The lowest concentration of SARS-CoV-2 viral copies this assay can detect is 250 copies / mL. A negative result does not preclude SARS-CoV-2 infection and should not be used as the sole basis for treatment or other patient management decisions.  A negative result may occur with improper specimen collection / handling, submission of specimen other than nasopharyngeal swab, presence of viral mutation(s) within the areas targeted by this assay, and inadequate number of viral copies (<250 copies / mL). A negative result must be  combined with clinical observations, patient history, and epidemiological information.  Fact Sheet for Patients:   https://www.patel.info/  Fact Sheet for Healthcare Providers: https://hall.com/  This test is not yet approved or  cleared by the [place] FDA and has been authorized for detection and/or diagnosis of SARS-CoV-2 by FDA under an Emergency Use Authorization (EUA).  This EUA will remain in effect (meaning this test can be used) for the duration of the COVID-19 declaration under Section 564(b)(1) of the Act, 21 U.S.C. section 360bbb-3(b)(1), unless the authorization is terminated or revoked sooner.  Performed at [facility], [place] [address] [place], [place] [address]   MRSA Next Gen by PCR, Nasal     Status: None   Collection Time: 03/23/22  9:27 AM  Result Value Ref Range Status   MRSA by PCR Next Gen NOT DETECTED NOT DETECTED Final    Comment: (NOTE) The GeneXpert MRSA Assay (FDA approved for NASAL specimens only), is one component of a comprehensive MRSA colonization surveillance program. It is not intended to diagnose MRSA infection nor to guide or monitor treatment for MRSA infections. Test performance is not FDA approved in patients less than 52 years old. Performed at [facility], [place] [address] [place], [place] [address]      Radiology Studies: No results found.  Scheduled Meds:  amiodarone  150 mg Intravenous Once   apixaban  2.5 mg Oral BID   aspirin  81 mg Oral Daily   empagliflozin  10 mg Oral QAC breakfast   ferrous sulfate  325 mg Oral Q breakfast   [START ON 03/30/2022] furosemide  40 mg Intravenous Daily   guaiFENesin  600 mg Oral BID   insulin aspart  0-5 Units Subcutaneous QHS   insulin aspart  0-9 Units Subcutaneous TID WC   levothyroxine  88 mcg Oral Q0600   loratadine  10 mg Oral Daily   melatonin  5 mg Oral QHS   methylPREDNISolone (SOLU-MEDROL) injection  125 mg Intravenous  Daily   montelukast  10 mg Oral Daily   pantoprazole  40 mg Oral Daily   potassium chloride  40 mEq Oral Once   rosuvastatin  20 mg Oral Daily   Continuous Infusions:  amiodarone     Followed by   amiodarone     azithromycin 500 mg (03/28/22 1615)   [START ON 03/30/2022] ceFEPime (MAXIPIME) IV     magnesium sulfate bolus IVPB       LOS: 6 days   Phillips Climes, MD [facility]  03/29/2022, 2:52 PM

## 2022-03-29 NOTE — Consult Note (Signed)
Cardiology Consultation:   Patient ID: Ronnie Joseph MRN: [id number]; DOB: [birth year]  Admit date: 03/31/2022 Date of Consult: 03/29/2022  PCP:  [name], NP   [facility] Providers Cardiologist:  Donato Heinz, MD  Electrophysiologist:  Vickie Epley, MD      Patient Profile:   Ronnie Joseph is a 88 y.o. male with a hx of CAD status post LAD and left circumflex PCI (05/2019), ischemic cardiomyopathy (LVEF 25%--> 40 to 45% %), PAF, GI bleed, AVMs, who is being seen 03/29/2022 for the evaluation of ventricular tachycardia at the request of Dr. Waldron Labs.  History of Present Illness:   Ronnie Joseph is currently admitted with pneumonia.  He presented with weakness, shortness of breath, hypoxia, and cough.  He was admitted to the hospital and treated with antibiotics for sepsis and pneumonia.  Cardiology was consulted because on 6/18 he had recurrent episodes of ventricular tachycardia.  Today he reports that his breathing has been improving but he feels very diaphoretic.  He has no chest pain and his breathing is improving.    His hospitalization has been complicated by mild AKI.  As of this morning potassium and magnesium were unremarkable.  He previously underwent cardiac catheterization 05/2019 and had PCI to the mid LAD and distal left circumflex.  Echocardiogram at that time revealed LVEF 25 to 30%.  On follow-up echo in 09/2020 his LVEF improved to 35 to 40%.  He was admitted at an outside hospital with NSTEMI 09/2020.  At the time he was febrile and had a troponin of 8300.  He was also anemic requiring 2 units of packed red blood cells.  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  He also had AVMs in the ascending colon that were treated with APC.  He was subsequently maintained on Plavix and Eliquis without recurrent bleeding.  He started on amiodarone for atrial fibrillation.  His most recent echo 07/2021 risk revealed LVEF 40 to 45%.  He  had normal right ventricular function.  He last saw Dr. Nechama Guard on 12/2021 and was doing well.  At that visit metoprolol was discontinued due to bradycardia.  Past Medical History:  Diagnosis Date   Acute anemia 09/26/2020   Anemia due to acute blood loss 05/25/2019   Chronic maxillary sinusitis 08/06/2017   Coronary artery disease due to lipid rich plaque 07/27/2019   Essential hypertension 02/14/2003   Fainting 05/30/2019   Fall 05/25/1999   High cholesterol 03/14/2001   History of basal cell carcinoma 11/02/2019   History of pneumonia    History of snoring    History of upper respiratory infection    Irregular heartbeat 08/06/2003   Ischemic cardiomyopathy 05/30/2019   Mild cognitive impairment 05/15/2019   NSTEMI (non-ST elevated myocardial infarction) ([place]) 06/03/2019   Poor renal function 03/19/2005   Primary osteoarthritis of shoulder 10/24/2015   Psoriasis 12/29/2004   Rash and nonspecific skin eruption 04/30/2020   Schatzki's ring 08/07/2020   Sepsis with acute hypoxic respiratory failure ([place]) 09/26/2020   Sleep apnea 08/13/2003    Past Surgical History:  Procedure Laterality Date   APPENDECTOMY     CATARACT EXTRACTION     COLONOSCOPY     HERNIA REPAIR     RIGHT HEART CATHETERIZATION WITH ADENOSINE STUDY     UPPER GI ENDOSCOPY       Home Medications:  Prior to Admission medications   Medication Sig Start Date End Date Taking? Authorizing Provider  acetaminophen (TYLENOL)  500 MG tablet Take 1,000 mg by mouth every 8 (eight) hours.   Yes [provider]  acetaminophen (TYLENOL) 500 MG tablet Take 500 mg by mouth every 6 (six) hours as needed for moderate pain.   Yes [provider]  amiodarone (PACERONE) 200 MG tablet TAKE 1 TABLET DAILY Patient taking differently: Take 200 mg by mouth daily. 03/10/22  Yes Donato Heinz, MD  apixaban (ELIQUIS) 2.5 MG TABS tablet Take 1 tablet (2.5 mg total) by mouth 2 (two) times daily. 07/04/21  Yes  Donato Heinz, MD  aspirin 81 MG chewable tablet Chew 81 mg by mouth daily.   Yes [provider]  bisacodyl (DULCOLAX) 5 MG EC tablet Take 10 mg by mouth See admin instructions. Every 3rd night as needed for constipation   Yes [provider]  CALCIUM CITRATE PO Take 200 mg by mouth 2 (two) times daily.   Yes [provider]  Dextromethorphan-guaiFENesin (MUCINEX DM) 30-600 MG TB12 Take 1 tablet by mouth 2 (two) times daily as needed (cough/congestion).   Yes [provider]  Emollient (CERAVE) LOTN Apply 1 application  topically daily as needed (itching).   Yes [provider]  ferrous sulfate 325 (65 FE) MG tablet Take 325 mg by mouth daily with breakfast.   Yes [provider]  fluticasone (FLONASE) 50 MCG/ACT nasal spray Place 2 sprays into both nostrils daily.   Yes [provider]  furosemide (LASIX) 20 MG tablet Take 20 mg once daily for 5 days and then take as needed for weight gain of 3 lbs in one day or 5 lbs in one week Patient taking differently: Take 20 mg by mouth See admin instructions. Qd x 3 days if weight increases greater than 5 pounds 10/16/21  Yes Donato Heinz, MD  JARDIANCE 10 MG TABS tablet TAKE 1 TABLET DAILY BEFORE BREAKFAST Patient taking differently: Take 10 mg by mouth daily. 03/10/22  Yes Donato Heinz, MD  ketoconazole (NIZORAL) 2 % cream Apply 1 application  topically daily as needed (rash/fungal infection on plantar feet and buttocks).   Yes [provider]  levocetirizine (XYZAL) 5 MG tablet TAKE 1 TABLET DAILY Patient taking differently: Take 10 mg by mouth at bedtime. 03/16/22  Yes Fargo, Amy E, NP  levothyroxine (SYNTHROID) 88 MCG tablet TAKE 1 TABLET DAILY Patient taking differently: Take 88 mcg by mouth daily before breakfast. 03/10/22  Yes Fargo, Amy E, NP  montelukast (SINGULAIR) 10 MG tablet TAKE 1 TABLET DAILY Patient taking differently: Take 10 mg by mouth  daily. 11/03/21  Yes Fargo, Amy E, NP  Multiple Vitamins-Minerals (SPECTRAVITE PO) Take 1 tablet by mouth daily.   Yes [provider]  NON FORMULARY Take 60-120 fluid ounces by mouth See admin instructions. May have  120 ounces of wine as needed  Or 60 ounces of scotch   Yes [provider]  polyethylene glycol (MIRALAX / GLYCOLAX) 17 g packet Take 17 g by mouth daily.   Yes [provider]  rosuvastatin (CRESTOR) 20 MG tablet Take 1 tablet (20 mg total) by mouth daily. 09/25/21  Yes Fargo, Amy E, NP  Saw [place] 450 MG CAPS Take 900 mg by mouth in the morning and at bedtime.   Yes [provider]  senna (SENOKOT) 8.6 MG TABS tablet Take 8.6 mg by mouth 2 (two) times daily.   Yes [provider]  triamcinolone ointment (KENALOG) 0.1 % Apply 1 application  topically See admin instructions.  Monday-Friday as needed for itching 12/23/21  Yes [provider]  losartan (COZAAR) 50 MG tablet TAKE 1 TABLET EVERY MORNING 03/23/22   Donato Heinz, MD  pantoprazole (PROTONIX) 40 MG tablet TAKE 1 TABLET DAILY 03/23/22   [name], NP    Inpatient Medications: Scheduled Meds:  apixaban  2.5 mg Oral BID   aspirin  81 mg Oral Daily   empagliflozin  10 mg Oral QAC breakfast   ferrous sulfate  325 mg Oral Q breakfast   [START ON 03/30/2022] furosemide  40 mg Intravenous Daily   guaiFENesin  600 mg Oral BID   insulin aspart  0-5 Units Subcutaneous QHS   insulin aspart  0-9 Units Subcutaneous TID WC   levothyroxine  88 mcg Oral Q0600   loratadine  10 mg Oral Daily   melatonin  5 mg Oral QHS   [START ON 03/30/2022] methylPREDNISolone (SOLU-MEDROL) injection  40 mg Intravenous Daily   metoprolol tartrate  25 mg Oral BID   montelukast  10 mg Oral Daily   pantoprazole  40 mg Oral Daily   rosuvastatin  20 mg Oral Daily   Continuous Infusions:  amiodarone 60 mg/hr (03/29/22 1534)   Followed by   amiodarone     azithromycin 500 mg (03/28/22  1615)   [START ON 03/30/2022] ceFEPime (MAXIPIME) IV     magnesium sulfate bolus IVPB     PRN Meds: albuterol, benzonatate, diphenhydrAMINE, lip balm  Allergies:    Allergies  Allergen Reactions   Lisinopril Swelling   Telmisartan Hives   Atorvastatin Rash   Sulfa Antibiotics Rash   Sulfamethoxazole-Trimethoprim Rash    Social History:   Social History   Socioeconomic History   Marital status: Widowed    Spouse name: Not on file   Number of children: Not on file   Years of education: Not on file   Highest education level: Not on file  Occupational History   Not on file  Tobacco Use   Smoking status: Former    Packs/day: 0.25    Types: Cigarettes    Quit date: 12/17/1958    Years since quitting: 63.3   Smokeless tobacco: Never  Vaping Use   Vaping Use: Never used  Substance and Sexual Activity   Alcohol use: Yes    Alcohol/week: 3.0 standard drinks of alcohol    Types: 3 Standard drinks or equivalent per week   Drug use: Never   Sexual activity: Not Currently  Other Topics Concern   Not on file  Social History Narrative   Tobacco use, amount per day now: 0   Past tobacco use, amount per day: Less than 1 pack   How many years did you use tobacco: 5 years, stopped in 1960   Alcohol use (drinks per week): 4   Diet: N/A   Do you drink/eat things with caffeine: Yes.   Marital status: Widowed                                  What year were you married? 1960   Do you live in a house, apartment, assisted living, condo, trailer, etc.? Assisted Living.   Is it one or more stories? 1    How many persons live in your home? 1   Do you have pets in your home?( please list) No.   Highest Level of education completed? College   Current or past profession: Pharmacist  Do you exercise?  No.                                Type and how often?   Do you have a living will? Yes.   Do you have a DNR form?  Yes.                                 If not, do you want to discuss one?    Do you have signed POA/HPOA forms? Yes.                       If so, please bring to you appointment      Do you have any difficulty bathing or dressing yourself? Yes.   Do you have any difficulty preparing food or eating? Yes.   Do you have any difficulty managing your medications? Yes.   Do you have any difficulty managing your finances? No.   Do you have any difficulty affording your medications? No.   Social Determinants of Health   Financial Resource Strain: Not on file  Food Insecurity: Not on file  Transportation Needs: Not on file  Physical Activity: Not on file  Stress: Not on file  Social Connections: Not on file  Intimate Partner Violence: Not on file    Family History:    Family History  Problem Relation Age of Onset   Lung disease Mother    Heart disease Father    Heart attack Father    Brain cancer Brother    Breast cancer Daughter    Celiac disease Daughter    Diabetes Son    Diabetes Mellitus I Son    Stomach cancer Other    Pancreatic cancer Other    Esophageal cancer Other    Colon polyps Other    Colon cancer Other      ROS:  Please see the history of present illness.  All other ROS reviewed and negative.     Physical Exam/Data:   Vitals:   03/28/22 2000 03/28/22 2304 03/29/22 0321 03/29/22 0500  BP: (!) 110/49 (!) 108/95 (!) 115/48   Pulse:  71 72   Resp: '16 18 16   '$ Temp: 97.8 F (36.6 C) 98.2 F (36.8 C) 97.9 F (36.6 C)   TempSrc: Oral Oral Oral   SpO2: (!) 89% 90% 92%   Weight:    72.9 kg  Height:        Intake/Output Summary (Last 24 hours) at 03/29/2022 1547 Last data filed at 03/28/2022 2304 Gross per 24 hour  Intake 579.43 ml  Output 600 ml  Net -20.57 ml      03/29/2022    5:00 AM 03/28/2022    5:00 AM 03/27/2022    2:02 AM  Last 3 Weights  Weight (lbs) 160 lb 11.5 oz 160 lb 7.9 oz 163 lb 12.8 oz  Weight (kg) 72.9 kg 72.8 kg 74.3 kg     VS:  BP (!) 115/48 (BP Location: Right Arm)   Pulse 72   Temp 97.9 F (36.6 C)  (Oral)   Resp 16   Ht '5\' 8"'$  (1.727 m)   Wt 72.9 kg   SpO2 92%   BMI 24.44 kg/m  , BMI Body mass index is 24.44 kg/m. GENERAL:  Ill-appearing.  Diaphoretic. HEENT: Pupils equal round and reactive, fundi not  visualized, oral mucosa unremarkable NECK:  No jugular venous distention, waveform within normal limits, carotid upstroke brisk and symmetric, no bruits, no thyromegaly LUNGS:  Bilateral rhonchi HEART:  RRR.  PMI not displaced or sustained,S1 and S2 within normal limits, no S3, no S4, no clicks, no rubs, no murmurs ABD:  Flat, positive bowel sounds normal in frequency in pitch, no bruits, no rebound, no guarding, no midline pulsatile mass, no hepatomegaly, no splenomegaly EXT:  2 plus pulses throughout, no edema, no cyanosis no clubbing SKIN:  No rashes no nodules NEURO:  Cranial nerves II through XII grossly intact, motor grossly intact throughout PSYCH:  Cognitively intact, oriented to person place and time   EKG:  The EKG was personally reviewed and demonstrates:  pending Telemetry:  Telemetry was personally reviewed and demonstrates:  Sinus rhythm, sinus tachycardia.  Frequent PVCs.  Long runs of ventricular tachycardia.  Relevant CV Studies:  Echo 07/15/21:   1. Left ventricular ejection fraction, by estimation, is 40 to 45%. The  left ventricle has moderately decreased function. There is akinesis of the  inferior, inferoseptal, anteroseptal walls and hypokinesis in the  remaining myocardial wall segments. Left   ventricular diastolic parameters are consistent with Grade I diastolic  dysfunction (impaired relaxation).   2. Right ventricular systolic function is normal. The right ventricular  size is normal. There is normal pulmonary artery systolic pressure.   3. The mitral valve is normal in structure. Mild mitral valve  regurgitation. No evidence of mitral stenosis.   4. The aortic valve is normal in structure. Aortic valve regurgitation is  mild. Mild aortic valve  sclerosis is present, with no evidence of aortic  valve stenosis. Aortic regurgitation PHT measures 670 msec.   5. The inferior vena cava is normal in size with greater than 50%  respiratory variability, suggesting right atrial pressure of 3 mmHg.   Laboratory Data:  High Sensitivity Troponin:  No results for input(s): "TROPONINIHS" in the last 720 hours.   Chemistry Recent Labs  Lab 03/23/22 0624 03/24/22 0444 03/26/22 0120 03/27/22 0049 03/28/22 0043 03/29/22 0107  NA 139   < > 139 140 137 141  K 3.6   < > 3.8 4.0 4.0 3.5  CL 107   < > 105 104 104 103  CO2 21*   < > '24 23 23 23  '$ GLUCOSE 110*   < > 99 115* 92 130*  BUN 15   < > '19 21 20 '$ 33*  CREATININE 1.33*   < > 1.38* 1.41* 1.52* 1.72*  CALCIUM 8.0*   < > 8.0* 8.3* 8.0* 8.5*  MG 2.5*  --  2.3  --   --  2.5*  GFRNONAA 51*   < > 49* 48* 44* 38*  ANIONGAP 11   < > '10 13 10 15   '$ < > = values in this interval not displayed.    Recent Labs  Lab 03/31/2022 1903  PROT 5.9*  ALBUMIN 2.6*  AST 55*  ALT 54*  ALKPHOS 53  BILITOT 0.8   Lipids No results for input(s): "CHOL", "TRIG", "HDL", "LABVLDL", "LDLCALC", "CHOLHDL" in the last 168 hours.  Hematology Recent Labs  Lab 03/27/22 0049 03/28/22 0043 03/29/22 0107  WBC 13.2* 11.4* 12.8*  RBC 2.99* 2.89* 3.10*  HGB 10.1* 10.0* 10.4*  HCT 31.0* 29.6* 31.7*  MCV 103.7* 102.4* 102.3*  MCH 33.8 34.6* 33.5  MCHC 32.6 33.8 32.8  RDW 13.3 13.3 13.2  PLT 522* 585* 708*   Thyroid No results  for input(s): "TSH", "FREET4" in the last 168 hours.  BNP Recent Labs  Lab 03/24/22 0444 03/28/22 0043  BNP 1,956.5* 1,219.1*    DDimer No results for input(s): "DDIMER" in the last 168 hours.   Radiology/Studies:  CT CHEST WO CONTRAST  Result Date: 03/27/2022 CLINICAL DATA:  Hypoxia EXAM: CT CHEST WITHOUT CONTRAST TECHNIQUE: Multidetector CT imaging of the chest was performed following the standard protocol without IV contrast. RADIATION DOSE REDUCTION: This exam was performed  according to the departmental dose-optimization program which includes automated exposure control, adjustment of the mA and/or kV according to patient size and/or use of iterative reconstruction technique. COMPARISON:  Chest x-ray 03/26/2022, CT 01/05/2022 FINDINGS: Cardiovascular: Mild cardiomegaly. No pericardial effusion. Thoracic aorta is nonaneurysmal. Atherosclerotic calcification of the aorta and coronary arteries. Main pulmonary trunk is mildly dilated. Mediastinum/Nodes: Interval development of numerous mildly prominent mediastinal and bilateral hilar lymph nodes including 10 mm upper right paratracheal node (series 3, image 39). No axillary lymphadenopathy. Thyroid, trachea, and esophagus demonstrate no significant findings. Lungs/Pleura: Multifocal bilateral airspace consolidations, most confluent within the right upper lobe with air bronchograms. Predominantly ground-glass opacities within the remaining lung fields. Moderate right and small left pleural effusions. Dependent left lower lobe atelectasis. No pneumothorax. Upper Abdomen: No acute abnormality. Musculoskeletal: Chronic mild superior endplate compression fractures of T2 and T3, unchanged. Severe degenerative disc disease of C6-7. Severe bilateral glenohumeral joint arthropathy. No new or acute bony findings. No chest wall abnormality. IMPRESSION: 1. Appearance compatible with multifocal pneumonia, most confluent within the right upper lobe. 2. Moderate right and small left pleural effusions. 3. Interval development of numerous mildly prominent mediastinal and bilateral hilar lymph nodes, likely reactive. 4. Aortic and coronary artery atherosclerosis (ICD10-I70.0). 5. Mildly dilated main pulmonary trunk, which can be seen in the setting of pulmonary arterial hypertension. Electronically Signed   By: Davina Poke D.O.   On: 03/27/2022 10:12   DG Chest Port 1 View  Result Date: 03/26/2022 CLINICAL DATA:  Shortness of breath, hypoxia,  pneumonia EXAM: PORTABLE CHEST 1 VIEW COMPARISON:  Previous studies including the examination of 03/24/2022 FINDINGS: Transverse diameter of heart is increased. There is interval decrease in interstitial markings in the parahilar regions. There are patchy infiltrates in the right upper lung fields right lower lung fields and both parahilar regions. There is slight interval decrease in infiltrate in the right upper lung fields and slight worsening of infiltrate in the right lower lung fields. Lateral CP angles are clear. There is no pneumothorax. Degenerative changes are noted in both shoulders, more severe on the right side. IMPRESSION: Infiltrates are seen in both lungs with interval slight decrease in infiltrate in the right upper lung fields and slight worsening of infiltrate in the right lower lung fields. Findings suggest multifocal pneumonia involving both lungs. There is decrease in interstitial markings in the parahilar regions suggesting possible resolving interstitial pulmonary edema. Electronically Signed   By: Elmer Picker M.D.   On: 03/26/2022 16:17     Assessment and Plan:   # Ventricular tachycardia:  Mr. Fabiano has long runs of monomorphic VT.  No clear ischemic symptoms or electrolyte abnormalities.  We will repeat EKG and get a troponin.  He appears euvolemic on exam.  Repeat limited echo for evidence of worsening systolic function.  Transition amiodarone to IV.  He is DNR.  However, on discussion, he prefers to have an attempt at defibrillation.  He is not interested in CPR or intubation.  We will transfer [facility] to [facility]  so that he can be defibrillated as quickly as possible.  Add metoprolol tartrate '25mg'$  q8h.  Check EKG for QTc.  # Chronic systolic and diastolic HF:  LVEF has improved from 25-->40%.  Repeat limited echo to evaluate for change.  Add metoprolol as above.  Continue Jardiance.   # CAP:  Per primary team.  # CAD s/p PCI: # Hyperlipidemia:  No active CP.  Echo, hs  troponin and metoprolol as above.  Continue aspirin and statin.   # PAF: Continue Eliquis and adding metoprolol.   Transfer to [place] Code Status: Limited.  OK with DCCV Called to update his daughter.  Total critical care time: 60 minutes. Critical care time was exclusive of separately billable procedures and treating other patients. Critical care was necessary to treat or prevent imminent or life-threatening deterioration. Critical care was time spent personally by me on the following activities: development of treatment plan with patient and/or surrogate as well as nursing, discussions with consultants, evaluation of patient's response to treatment, examination of patient, obtaining history from patient or surrogate, ordering and performing treatments and interventions, ordering and review of laboratory studies, ordering and review of radiographic studies, pulse oximetry and re-evaluation of patient's condition.   Risk Assessment/Risk Scores:        New York Heart Association (NYHA) Functional Class NYHA Class II  CHA2DS2-VASc Score = 5   This indicates a 7.2% annual risk of stroke. The patient's score is based upon: CHF History: 1 HTN History: 1 Diabetes History: 0 Stroke History: 0 Vascular Disease History: 1 Age Score: 2 Gender Score: 0         For questions or updates, please contact [facility] Please consult [email or web address] for contact info under    Signed, Skeet Latch, MD  03/29/2022 3:47 PM

## 2022-03-29 NOTE — Progress Notes (Signed)
PHARMACY NOTE:  ANTIMICROBIAL RENAL DOSAGE ADJUSTMENT  Current antimicrobial regimen includes a mismatch between antimicrobial dosage and estimated renal function.  As per policy approved by the Pharmacy & Therapeutics and Medical Executive Committees, the antimicrobial dosage will be adjusted accordingly.  Current antimicrobial dosage:  cefepime 2g q12h  Indication: sepsis  Renal Function: Estimated Creatinine Clearance: 28.7 mL/min (A) (by C-G formula based on SCr of 1.72 mg/dL (H)).  Antimicrobial dosage has been changed to:  cefepime 2g q24h  Thank you for allowing pharmacy to be a part of this patient's care.  Levonne Spiller, [facility] 03/29/2022 8:40 AM

## 2022-03-29 NOTE — Progress Notes (Signed)
Echocardiogram 2D Echocardiogram has been performed.  Joette Catching 03/29/2022, 4:03 PM

## 2022-03-29 NOTE — Progress Notes (Signed)
CPT not performed at this time, pt stated he felt tired and wanted to hold off for now.

## 2022-03-29 NOTE — Progress Notes (Signed)
RT called to patient's bedside due to desat on [place] 15L.  RT set up Heated high flow [place] 20L, 100% to allow the patient to recoup from the [place]. Will titrate as able.

## 2022-03-30 ENCOUNTER — Inpatient Hospital Stay (HOSPITAL_COMMUNITY): Payer: Medicare Other

## 2022-03-30 DIAGNOSIS — I5023 Acute on chronic systolic (congestive) heart failure: Secondary | ICD-10-CM

## 2022-03-30 DIAGNOSIS — J9601 Acute respiratory failure with hypoxia: Secondary | ICD-10-CM | POA: Diagnosis not present

## 2022-03-30 DIAGNOSIS — E039 Hypothyroidism, unspecified: Secondary | ICD-10-CM | POA: Diagnosis not present

## 2022-03-30 DIAGNOSIS — J189 Pneumonia, unspecified organism: Secondary | ICD-10-CM | POA: Diagnosis not present

## 2022-03-30 DIAGNOSIS — I472 Ventricular tachycardia, unspecified: Secondary | ICD-10-CM

## 2022-03-30 LAB — BASIC METABOLIC PANEL
Anion gap: 14 (ref 5–15)
BUN: 38 mg/dL — ABNORMAL HIGH (ref 8–23)
CO2: 21 mmol/L — ABNORMAL LOW (ref 22–32)
Calcium: 8.7 mg/dL — ABNORMAL LOW (ref 8.9–10.3)
Chloride: 107 mmol/L (ref 98–111)
Creatinine, Ser: 1.8 mg/dL — ABNORMAL HIGH (ref 0.61–1.24)
GFR, Estimated: 36 mL/min — ABNORMAL LOW (ref >60)
Glucose, Bld: 184 mg/dL — ABNORMAL HIGH (ref 70–99)
Potassium: 4.7 mmol/L (ref 3.5–5.1)
Sodium: 142 mmol/L (ref 135–145)

## 2022-03-30 LAB — GLUCOSE, CAPILLARY
Glucose-Capillary: 174 mg/dL — ABNORMAL HIGH (ref 70–99)
Glucose-Capillary: 246 mg/dL — ABNORMAL HIGH (ref 70–99)
Glucose-Capillary: 206 mg/dL — ABNORMAL HIGH (ref 70–99)
Glucose-Capillary: 199 mg/dL — ABNORMAL HIGH (ref 70–99)

## 2022-03-30 LAB — CBC
HCT: 38 % — ABNORMAL LOW (ref 39.0–52.0)
Hemoglobin: 12.6 g/dL — ABNORMAL LOW (ref 13.0–17.0)
MCH: 33.8 pg (ref 26.0–34.0)
MCHC: 33.2 g/dL (ref 30.0–36.0)
MCV: 101.9 fL — ABNORMAL HIGH (ref 80.0–100.0)
Platelets: 822 10*3/uL — ABNORMAL HIGH (ref 150–400)
RBC: 3.73 MIL/uL — ABNORMAL LOW (ref 4.22–5.81)
RDW: 13.3 % (ref 11.5–15.5)
WBC: 25.8 10*3/uL — ABNORMAL HIGH (ref 4.0–10.5)
nRBC: 0 % (ref 0.0–0.2)

## 2022-03-30 LAB — RAPID HIV SCREEN (HIV 1/2 AB+AG)
HIV 1/2 Antibodies: NONREACTIVE
HIV-1 P24 Antigen - HIV24: NONREACTIVE

## 2022-03-30 LAB — HEPATITIS B SURFACE ANTIGEN: Hepatitis B Surface Ag: NONREACTIVE

## 2022-03-30 LAB — MAGNESIUM: Magnesium: 3.3 mg/dL — ABNORMAL HIGH (ref 1.7–2.4)

## 2022-03-30 MED ORDER — FUROSEMIDE 10 MG/ML IJ SOLN
80.0000 mg | Freq: Once | INTRAMUSCULAR | Status: AC
  Administered 2022-03-30: 80 mg via INTRAVENOUS
  Filled 2022-03-30: qty 8

## 2022-03-30 MED ORDER — MORPHINE SULFATE (PF) 2 MG/ML IV SOLN
1.0000 mg | INTRAVENOUS | Status: DC | PRN
  Administered 2022-03-30 – 2022-03-31 (×3): 1 mg via INTRAVENOUS
  Filled 2022-03-30 (×3): qty 1

## 2022-03-30 MED ORDER — MORPHINE SULFATE (PF) 2 MG/ML IV SOLN: 1.0000 mg | INTRAVENOUS | Status: DC | PRN

## 2022-03-30 NOTE — Progress Notes (Signed)
OT Cancellation Note  Patient Details Name: Brodie Correll MRN: [id number] DOB: [birth year]   Cancelled Treatment:    Reason Eval/Treat Not Completed: Other (comment) (PT seeing pt this am. Will attempt later time.)  [facility] 03/30/2022, 9:27 AM Maurie Boettcher, OT/L   Acute OT Clinical Specialist Acute Rehabilitation Services Pager [phone] Office [phone]

## 2022-03-30 NOTE — Progress Notes (Signed)
PROGRESS NOTE    Ronnie Joseph  HWE:[id number] DOB: 12-Feb-1932 DOA: 04/03/2022 PCP: [name], NP   Brief Narrative:   Ronnie Joseph is a 88 y.o. male with medical history significant of CAD status post PCI 05/2019, ischemic cardiomyopathy, paroxysmal A-fib on Eliquis, hypertension, hyperlipidemia, mild cognitive impairment, type 2 diabetes, hypothyroidism, OSA, chronic cough, GERD, iron deficiency anemia due to chronic blood loss from AVMs and GI advised against repeat endoscopic procedures given his age. Recently seen in the ED on 6/6 for fevers, chills, and generalized weakness.  He was diagnosed with pneumonia (chest x-ray showing hazy right infrahilar opacity) and discharged on Augmentin and azithromycin.   He returned to the ED complaining of generalized weakness, shortness of breath, and cough.  SPO2 86-87% on room air and placed on 3 L supplemental oxygen.  Afebrile and not tachycardic or hypotensive.  Chest x-ray showing findings consistent with multifocal pneumonia. Patient was given vancomycin and cefepime.   Patient reports 1 week history of generalized weakness, cough, shortness of breath, and poor appetite.  States he feels so weak that he is barely able to get up and walk.  He does not use oxygen at home.  Reports chronic bilateral lower extremity edema for which she takes furosemide as needed if he gains weight.  He is vaccinated against COVID.  He is a retired Software engineer.  Assessment & Plan:   Principal Problem:   Multifocal pneumonia Active Problems:   Acquired hypothyroidism   Atrial fibrillation ([place])   Severe sepsis ([place])   Hypercholesterolemia   Acute respiratory failure with hypoxia (HCC)   Hypokalemia   Ventricular tachycardia (HCC)   Acute on chronic systolic heart failure (HCC)  Severe sepsis and acute hypoxic respiratory failure secondary to multifocal community-acquired pneumonia:  - Patient met severe sepsis criteria based on tachypnea and leukocytosis as well  as acute hypoxia.  Evidence of pneumonia on the imaging study.  He failed outpatient antibiotic therapy.  Urine antigen for Legionella but Streptococcus negative.  Lactic acid normal.  COVID negative.   -Screen MRSA is negative, stopped IV vancomycin. -Treated with IV cefepime, and IV azithromycin IV azithromycin has been discontinued given prolonged QTc -Patient was encouraged with incentive spirometry and flutter valve. -With significant oxygen requirement initially, this has improved, but he deteriorated over last 24 hours where he is transferred to CCU on heated high flow nasal cannula.  Acute on chronic combined systolic and diastolic congestive heart failure:  -BNP significantly elevated, with volume overload. -On IV Lasix, this has been adjusted according to renal function, currently on once daily.  -Pete 2D echo with significant drop in EF to 20%, with global hypokinesis Ventricular  tachycardia -Cardiology input greatly appreciated, he is on p.o. amiodarone at home, currently started on amiodarone drip. -He is started on beta-blockers as well. -Agement per cardiology. -Keep potassium> 4, and magnesium> 2.  Mild hypokalemia:  Replaced and resolved.  Severe MR -Management per cardiology  CAD s/p PCI/hyperlipidemia: No ACS symptoms.  Continue statin and aspirin.  Paroxysmal atrial fibrillation: Currently in sinus rhythm.  Continue amiodarone and Eliquis.  History of essential hypertension: Blood pressure on the low side.   now he is on beta-blockers for ventricular tachycardia  AKI on CKD stage IIIa: -Creatinine has been trending up, likely due to cardiorenal  Non-insulin-dependent type 2 diabetes mellitus:  - Resumed Jardiance.  Continue SSI.  Blood sugar controlled.  Acquired hypothyroidism: Continue Synthroid.  GERD: Continue PPI.  Chronic blood loss anemia/iron deficiency anemia: Due  to chronic blood loss from AVMs and GI advised against repeat endoscopic procedures  given his age.  He is followed by hematology and continues to be on Eliquis for A-fib.  Hemoglobin currently 10.7, was 11.4 on 03/17/2022. -Continue iron supplement -Continue to monitor hemoglobin  Generalized weakness: PT OT consulted.  Goals of care -I have discussed with the patient earlier today, and with the daughter later by phone, I have informed him overall patient with very poor prognosis in the setting of his significantly worsening hypoxia and increased oxygen requirement, and new cardiac findings EF of 20%, and severe MR, as well him having frequent episode of ventricular tachycardia, patient appears uncomfortable, we discussed about palliative care, and daughter is interested, as I have informed him prognosis is very poor especially with new findings of his worsening hypoxia, heart failure and valve disease, and likely for comfort measures if no significant improvement.  DVT prophylaxis: apixaban (ELIQUIS) tablet 2.5 mg Start: 03/23/22 1000   Code Status: Partial Code  Family Communication: Discussed with daughter by phone on 6/19 Status is: Inpatient Remains inpatient appropriate because: Patient sick with significant hypoxia  Estimated body mass index is 22.68 kg/m as calculated from the following:   Height as of this encounter: $RemoveBeforeD'5\' 10"'[id number]$  (1.778 m).   Weight as of this encounter: 71.7 kg.    Nutritional Assessment: Body mass index is 22.68 kg/m.Marland Kitchen Seen by dietician.  I agree with the assessment and plan as outlined below: Nutrition Status:        . Skin Assessment: I have examined the patient's skin and I agree with the wound assessment as performed by the wound care RN as outlined below:    Consultants:  Cardiology Palliaitve  Procedures:  None  Antimicrobials:  Anti-infectives (From admission, onward)    Start     Dose/Rate Route Frequency Ordered Stop   03/30/22 0822  ceFEPIme (MAXIPIME) 2 g in sodium chloride 0.9 % 100 mL IVPB        2 g 200 mL/hr over 30  Minutes Intravenous Every 24 hours 03/29/22 0841     03/26/22 1730  azithromycin (ZITHROMAX) 500 mg in sodium chloride 0.9 % 250 mL IVPB  Status:  Discontinued        500 mg 250 mL/hr over 60 Minutes Intravenous Every 24 hours 03/26/22 1650 03/30/22 0657   03/24/22 0500  vancomycin (VANCOREADY) IVPB 750 mg/150 mL  Status:  Discontinued        750 mg 150 mL/hr over 60 Minutes Intravenous Every 24 hours 03/23/22 0515 03/24/22 1051   03/23/22 1600  ceFEPIme (MAXIPIME) 2 g in sodium chloride 0.9 % 100 mL IVPB  Status:  Discontinued        2 g 200 mL/hr over 30 Minutes Intravenous Every 12 hours 03/23/22 0515 03/29/22 0841   03/23/22 0315  ceFEPIme (MAXIPIME) 2 g in sodium chloride 0.9 % 100 mL IVPB        2 g 200 mL/hr over 30 Minutes Intravenous  Once 03/23/22 0307 03/23/22 0459   03/23/22 0315  vancomycin (VANCOREADY) IVPB 1500 mg/300 mL        1,500 mg 150 mL/hr over 120 Minutes Intravenous  Once 03/23/22 0307 03/23/22 0724         Subjective:  Patient reports dyspnea, cough, history of significantly increased oxygen requirement overnight, he reports fatigue, dizziness  Objective: Vitals:   03/30/22 0700 03/30/22 0800 03/30/22 0810 03/30/22 0900  BP: (!) 116/45 (!) 119/49  136/64  Pulse: 60  72 70 74  Resp: (!) 26 (!) 38 (!) 33 (!) 25  Temp:  (!) 97.4 F (36.3 C)    TempSrc:  Axillary    SpO2: 97%  94% (!) 89%  Weight:      Height:        Intake/Output Summary (Last 24 hours) at 03/30/2022 1024 Last data filed at 03/30/2022 0800 Gross per 24 hour  Intake 671.32 ml  Output 550 ml  Net 121.32 ml   Filed Weights   03/28/22 0500 03/29/22 0500 03/29/22 1828  Weight: 72.8 kg 72.9 kg 71.7 kg    Examination:  Awake Alert, Oriented X 3, ill-appearing, deconditioned, uncomfortable t Symmetrical Chest wall movement, managed air entry at the bases RRR,No Gallops,Rubs or new Murmurs, No Parasternal Heave +ve B.Sounds, Abd Soft, No tenderness, No rebound - guarding or  rigidity. No Cyanosis, Clubbing or edema, No new Rash or bruise        Data Reviewed: I have personally reviewed following labs and imaging studies  CBC: Recent Labs  Lab 03/24/22 0444 03/26/22 0120 03/27/22 0049 03/28/22 0043 03/29/22 0107 03/30/22 0036  WBC 17.3* 11.9* 13.2* 11.4* 12.8* 25.8*  NEUTROABS 15.0*  --   --   --   --   --   HGB 10.4* 10.0* 10.1* 10.0* 10.4* 12.6*  HCT 31.4* 30.1* 31.0* 29.6* 31.7* 38.0*  MCV 103.3* 101.0* 103.7* 102.4* 102.3* 101.9*  PLT 511* 567* 522* 585* 708* 242*   Basic Metabolic Panel: Recent Labs  Lab 03/26/22 0120 03/27/22 0049 03/28/22 0043 03/29/22 0107 03/30/22 0036  NA 139 140 137 141 142  K 3.8 4.0 4.0 3.5 4.7  CL 105 104 104 103 107  CO2 _0 21*  GLUCOSE 99 115* 92 130* 184*  BUN _1 33* 38*  CREATININE 1.38* 1.41* 1.52* 1.72* 1.80*  CALCIUM 8.0* 8.3* 8.0* 8.5* 8.7*  MG 2.3  --   --  2.5* 3.3*   GFR: Estimated Creatinine Clearance: 28.8 mL/min (A) (by C-G formula based on SCr of 1.8 mg/dL (H)). Liver Function Tests: No results for input(s): "AST", "ALT", "ALKPHOS", "BILITOT", "PROT", "ALBUMIN" in the last 168 hours.  No results for input(s): "LIPASE", "AMYLASE" in the last 168 hours. No results for input(s): "AMMONIA" in the last 168 hours. Coagulation Profile: No results for input(s): "INR", "PROTIME" in the last 168 hours. Cardiac Enzymes: No results for input(s): "CKTOTAL", "CKMB", "CKMBINDEX", "TROPONINI" in the last 168 hours. BNP (last 3 results) No results for input(s): "PROBNP" in the last 8760 hours. HbA1C: No results for input(s): "HGBA1C" in the last 72 hours.  CBG: Recent Labs  Lab 03/28/22 2127 03/29/22 0811 03/29/22 1226 03/29/22 2243 03/30/22 0754  GLUCAP 294* 154* 248* 199* 174*   Lipid Profile: No results for input(s): "CHOL", "HDL", "LDLCALC", "TRIG", "CHOLHDL", "LDLDIRECT" in the last 72 hours. Thyroid Function Tests: No results for input(s): "TSH", "T4TOTAL", "FREET4",  "T3FREE", "THYROIDAB" in the last 72 hours. Anemia Panel: No results for input(s): "VITAMINB12", "FOLATE", "FERRITIN", "TIBC", "IRON", "RETICCTPCT" in the last 72 hours. Sepsis Labs: Recent Labs  Lab 03/23/22 1121 03/24/22 0444 03/27/22 0049 03/28/22 0043  PROCALCITON <0.10 0.19 0.30 0.20    Recent Results (from the past 240 hour(s))  Blood culture (routine x 2)     Status: None   Collection Time: 03/23/22  3:44 AM   Specimen: BLOOD  Result Value Ref Range Status   Specimen Description BLOOD SITE NOT SPECIFIED  Final   Special Requests  Final    BOTTLES DRAWN AEROBIC AND ANAEROBIC Blood Culture adequate volume   Culture   Final    NO GROWTH 5 DAYS Performed at [facility], [place] [address] [place], [place] [address]    Report Status 03/28/2022 FINAL  Final  Blood culture (routine x 2)     Status: None   Collection Time: 03/23/22  3:49 AM   Specimen: BLOOD  Result Value Ref Range Status   Specimen Description BLOOD SITE NOT SPECIFIED  Final   Special Requests   Final    BOTTLES DRAWN AEROBIC AND ANAEROBIC Blood Culture adequate volume   Culture   Final    NO GROWTH 5 DAYS Performed at [facility], [place] [address], [place], [place] [address]    Report Status 03/28/2022 FINAL  Final  SARS Coronavirus 2 by RT PCR (hospital order, performed in [facility] hospital lab) *cepheid single result test*     Status: None   Collection Time: 03/23/22  9:27 AM  Result Value Ref Range Status   SARS Coronavirus 2 by RT PCR NEGATIVE NEGATIVE Final    Comment: (NOTE) SARS-CoV-2 target nucleic acids are NOT DETECTED.  The SARS-CoV-2 RNA is generally detectable in upper and lower respiratory specimens during the acute phase of infection. The lowest concentration of SARS-CoV-2 viral copies this assay can detect is 250 copies / mL. A negative result does not preclude SARS-CoV-2 infection and should not be used as the sole basis for treatment or other patient management  decisions.  A negative result may occur with improper specimen collection / handling, submission of specimen other than nasopharyngeal swab, presence of viral mutation(s) within the areas targeted by this assay, and inadequate number of viral copies (<250 copies / mL). A negative result must be combined with clinical observations, patient history, and epidemiological information.  Fact Sheet for Patients:   https://www.patel.info/  Fact Sheet for Healthcare Providers: https://hall.com/  This test is not yet approved or  cleared by the [place] FDA and has been authorized for detection and/or diagnosis of SARS-CoV-2 by FDA under an Emergency Use Authorization (EUA).  This EUA will remain in effect (meaning this test can be used) for the duration of the COVID-19 declaration under Section 564(b)(1) of the Act, 21 U.S.C. section 360bbb-3(b)(1), unless the authorization is terminated or revoked sooner.  Performed at [facility], [place] [address] [place], [place] [address]   MRSA Next Gen by PCR, Nasal     Status: None   Collection Time: 03/23/22  9:27 AM  Result Value Ref Range Status   MRSA by PCR Next Gen NOT DETECTED NOT DETECTED Final    Comment: (NOTE) The GeneXpert MRSA Assay (FDA approved for NASAL specimens only), is one component of a comprehensive MRSA colonization surveillance program. It is not intended to diagnose MRSA infection nor to guide or monitor treatment for MRSA infections. Test performance is not FDA approved in patients less than 83 years old. Performed at [facility], [place] [address], [place], [place] [address]      Radiology Studies: ECHOCARDIOGRAM LIMITED  Result Date: 03/29/2022    ECHOCARDIOGRAM LIMITED REPORT   Patient Name:   Ronnie Joseph Date of Exam: 03/29/2022 Medical Rec #:  [id number]   Height:       68.0 in Accession #:    [id number]  Weight:       160.7 lb Date of Birth:  1932/04/04    BSA:  1.862 m Patient Age:    23 years    BP:           136/71 mmHg Patient Gender: M           HR:           96 bpm. Exam Location:  Inpatient Procedure: Limited Echo and Limited Color Doppler STAT ECHO Indications:    Ventricular tachycardia  History:        Patient has prior history of Echocardiogram examinations, most                 recent 07/15/2021.  Sonographer:    Joette Catching RCS Referring Phys: [phone] TIFFANY Elliott IMPRESSIONS  1. Left ventricular ejection fraction, by estimation, is 20%. The left ventricle has severely decreased function. The left ventricle demonstrates global hypokinesis. No LV thrombus seen on non contrast study. Mildly dilated left ventricle.  2. Right ventricular systolic function is moderately reduced. There is mid and apical hypokinesis but preserved basal function. Normal size and thickness. RVSP 52 mm Hg.  3. Large pleural effusion in the left lateral region.  4. The mitral valve is degenerative. Severe mitral valve regurgitation Ventricular functional etiology.     Moderate to severe mitral annular calcification  5. Tricuspid valve regurgitation is mild to moderate.  6. The aortic valve is tricuspid. There is mild calcification of the aortic valve. There is mild thickening of the aortic valve. Aortic valve regurgitation is mild. Aortic valve sclerosis is present, with no evidence of aortic valve stenosis.  7. The inferior vena cava is dilated in size with >50% respiratory variability, suggesting right atrial pressure of 8 mmHg.  8. Left atrial size was moderately dilated.  9. Right atrial size was mildly dilated. Comparison(s): Significant change in LVEF, mitral regurgitation, and LV size from prior. FINDINGS  Left Ventricle: Papillary muscle hypertrophy. Left ventricular ejection fraction, by estimation, is 20%. The left ventricle has severely decreased function. The left ventricle demonstrates global hypokinesis. The left ventricular internal cavity size was  mildly dilated. Right Ventricle: The right ventricular size is normal. No increase in right ventricular wall thickness. Right ventricular systolic function is moderately reduced. There is moderately elevated pulmonary artery systolic pressure. The tricuspid regurgitant velocity is 3.30 m/s, and with an assumed right atrial pressure of 8 mmHg, the estimated right ventricular systolic pressure is 44.6 mmHg. Left Atrium: Left atrial size was moderately dilated. Right Atrium: Right atrial size was mildly dilated. Pericardium: Trivial pericardial effusion is present. Mitral Valve: The mitral valve is degenerative in appearance. Moderate to severe mitral annular calcification. Severe mitral valve regurgitation. Tricuspid Valve: Tricuspid valve regurgitation is mild to moderate. Aortic Valve: The aortic valve is tricuspid. There is mild calcification of the aortic valve. There is mild thickening of the aortic valve. There is mild aortic valve annular calcification. Aortic valve regurgitation is mild. Aortic regurgitation PHT measures 386 msec. Aortic valve sclerosis is present, with no evidence of aortic valve stenosis. Pulmonic Valve: The pulmonic valve was normal in structure. Pulmonic valve regurgitation is trivial. No evidence of pulmonic stenosis. Venous: The inferior vena cava is dilated in size with greater than 50% respiratory variability, suggesting right atrial pressure of 8 mmHg. Additional Comments: There is a large pleural effusion in the left lateral region. LEFT VENTRICLE PLAX 2D LVIDd:         6.00 cm LVIDs:         5.50 cm LV PW:  1.10 cm LV IVS:        0.90 cm  LV Volumes (MOD) LV vol d, MOD A2C: 150.0 ml LV vol d, MOD A4C: 135.0 ml LV vol s, MOD A2C: 105.0 ml LV vol s, MOD A4C: 117.0 ml LV SV MOD A2C:     45.0 ml LV SV MOD A4C:     135.0 ml LV SV MOD BP:      29.5 ml RIGHT VENTRICLE             IVC RV S prime:     12.20 cm/s  IVC diam: 2.30 cm TAPSE (M-mode): 1.4 cm AORTIC VALVE          PULMONIC  VALVE AI PHT:      386 msec PR End Diast Vel: 7.29 msec  MR Peak grad: 101.6 mmHg  TRICUSPID VALVE MR Mean grad: 65.0 mmHg   TR Peak grad:   43.6 mmHg MR Vmax:      504.00 cm/s TR Vmax:        330.00 cm/s MR Vmean:     384.0 cm/s Rudean Haskell MD Electronically signed by Rudean Haskell MD Signature Date/Time: 03/29/2022/4:59:31 PM    Final     Scheduled Meds:  apixaban  2.5 mg Oral BID   aspirin  81 mg Oral Daily   Chlorhexidine Gluconate Cloth  6 each Topical Daily   empagliflozin  10 mg Oral QAC breakfast   ferrous sulfate  325 mg Oral Q breakfast   furosemide  40 mg Intravenous Daily   guaiFENesin  600 mg Oral BID   insulin aspart  0-5 Units Subcutaneous QHS   insulin aspart  0-9 Units Subcutaneous TID WC   levothyroxine  88 mcg Oral Q0600   loratadine  10 mg Oral Daily   melatonin  5 mg Oral QHS   methylPREDNISolone (SOLU-MEDROL) injection  40 mg Intravenous Daily   metoprolol tartrate  25 mg Oral BID   montelukast  10 mg Oral Daily   pantoprazole  40 mg Oral Daily   rosuvastatin  20 mg Oral Daily   Continuous Infusions:  amiodarone 30 mg/hr (03/30/22 0800)   ceFEPime (MAXIPIME) IV 2 g (03/30/22 0839)     LOS: 7 days   Phillips Climes, MD [facility]  03/30/2022, 10:24 AM

## 2022-03-30 NOTE — TOC Progression Note (Signed)
Transition of Care [facility]) - Progression Note    Patient Details  Name: Ronnie Joseph MRN: [id number] Date of Birth: [birth year]  Transition of Care [facility] CM/SW [place], [place] Agency Phone Number: 03/30/2022, 4:41 PM  Clinical Narrative:     CSW following for SNF placement. Will fax out closer to being medically ready for dc. Patient currently on 35 liters heated high flow nasal cannula. CSW will continue to follow and assist with patients dc planning needs.  Expected Discharge Plan: [place] Barriers to Discharge: Continued Medical Work up, SNF Pending bed offer  Expected Discharge Plan and Services Expected Discharge Plan: [place] In-house Referral: Clinical Social Work   Post Acute Care Choice: [place] Living arrangements for the past 2 months: [place]                                       Social Determinants of Health (SDOH) Interventions    Readmission Risk Interventions     No data to display

## 2022-03-30 NOTE — Progress Notes (Signed)
Progress Note  Patient Name: Ronnie Joseph Date of Encounter: 03/30/2022  Primary Cardiologist:   Donato Heinz, MD   Subjective   Decreased sats and needed high flow O2.  Feels comfortable this morning.  Denies pain  Inpatient Medications    Scheduled Meds:  apixaban  2.5 mg Oral BID   aspirin  81 mg Oral Daily   Chlorhexidine Gluconate Cloth  6 each Topical Daily   empagliflozin  10 mg Oral QAC breakfast   ferrous sulfate  325 mg Oral Q breakfast   furosemide  40 mg Intravenous Daily   guaiFENesin  600 mg Oral BID   insulin aspart  0-5 Units Subcutaneous QHS   insulin aspart  0-9 Units Subcutaneous TID WC   levothyroxine  88 mcg Oral Q0600   loratadine  10 mg Oral Daily   melatonin  5 mg Oral QHS   methylPREDNISolone (SOLU-MEDROL) injection  40 mg Intravenous Daily   metoprolol tartrate  25 mg Oral BID   montelukast  10 mg Oral Daily   pantoprazole  40 mg Oral Daily   rosuvastatin  20 mg Oral Daily   Continuous Infusions:  amiodarone 30 mg/hr (03/30/22 0700)   ceFEPime (MAXIPIME) IV     magnesium sulfate bolus IVPB     PRN Meds: albuterol, benzonatate, diphenhydrAMINE, lip balm   Vital Signs    Vitals:   03/30/22 0400 03/30/22 0500 03/30/22 0600 03/30/22 0700  BP: (!) 129/58 90/68 (!) 131/59 (!) 116/45  Pulse: 69 75 72 60  Resp: (!) 32 17 (!) 27 (!) 26  Temp: 98 F (36.7 C)     TempSrc: Oral     SpO2: 94% 93% 94% 97%  Weight:      Height:        Intake/Output Summary (Last 24 hours) at 03/30/2022 0738 Last data filed at 03/30/2022 0700 Gross per 24 hour  Intake 654.73 ml  Output 400 ml  Net 254.73 ml   Filed Weights   03/28/22 0500 03/29/22 0500 03/29/22 1828  Weight: 72.8 kg 72.9 kg 71.7 kg    Telemetry    NSR - Personally Reviewed  ECG    NA - Personally Reviewed  Physical Exam   GEN: No acute distress.   Neck: No  JVD Cardiac: RRR, no murmurs, rubs, or gallops.  Respiratory:     Decreased breath sounds at the bases.  GI:  Soft, nontender, non-distended  MS: No  edema; No deformity. Neuro:  Nonfocal  Psych: Normal affect   Labs    Chemistry Recent Labs  Lab 03/28/22 0043 03/29/22 0107 03/30/22 0036  NA 137 141 142  K 4.0 3.5 4.7  CL 104 103 107  CO2 23 23 21*  GLUCOSE 92 130* 184*  BUN 20 33* 38*  CREATININE 1.52* 1.72* 1.80*  CALCIUM 8.0* 8.5* 8.7*  GFRNONAA 44* 38* 36*  ANIONGAP '10 15 14     '$ Hematology Recent Labs  Lab 03/28/22 0043 03/29/22 0107 03/30/22 0036  WBC 11.4* 12.8* 25.8*  RBC 2.89* 3.10* 3.73*  HGB 10.0* 10.4* 12.6*  HCT 29.6* 31.7* 38.0*  MCV 102.4* 102.3* 101.9*  MCH 34.6* 33.5 33.8  MCHC 33.8 32.8 33.2  RDW 13.3 13.2 13.3  PLT 585* 708* 822*    Cardiac EnzymesNo results for input(s): "TROPONINI" in the last 168 hours. No results for input(s): "TROPIPOC" in the last 168 hours.   BNP Recent Labs  Lab 03/24/22 0444 03/28/22 0043  BNP 1,956.5* 1,219.1*  DDimer No results for input(s): "DDIMER" in the last 168 hours.   Radiology    ECHOCARDIOGRAM LIMITED  Result Date: 03/29/2022    ECHOCARDIOGRAM LIMITED REPORT   Patient Name:   Ronnie Joseph Date of Exam: 03/29/2022 Medical Rec #:  [id number]   Height:       68.0 in Accession #:    [id number]  Weight:       160.7 lb Date of Birth:  November 29, 1931   BSA:          1.862 m Patient Age:    88 years    BP:           136/71 mmHg Patient Gender: M           HR:           96 bpm. Exam Location:  Inpatient Procedure: Limited Echo and Limited Color Doppler STAT ECHO Indications:    Ventricular tachycardia  History:        Patient has prior history of Echocardiogram examinations, most                 recent 07/15/2021.  Sonographer:    Joette Catching RCS Referring Phys: [phone] TIFFANY Hollister IMPRESSIONS  1. Left ventricular ejection fraction, by estimation, is 20%. The left ventricle has severely decreased function. The left ventricle demonstrates global hypokinesis. No LV thrombus seen on non contrast study. Mildly dilated  left ventricle.  2. Right ventricular systolic function is moderately reduced. There is mid and apical hypokinesis but preserved basal function. Normal size and thickness. RVSP 52 mm Hg.  3. Large pleural effusion in the left lateral region.  4. The mitral valve is degenerative. Severe mitral valve regurgitation Ventricular functional etiology.     Moderate to severe mitral annular calcification  5. Tricuspid valve regurgitation is mild to moderate.  6. The aortic valve is tricuspid. There is mild calcification of the aortic valve. There is mild thickening of the aortic valve. Aortic valve regurgitation is mild. Aortic valve sclerosis is present, with no evidence of aortic valve stenosis.  7. The inferior vena cava is dilated in size with >50% respiratory variability, suggesting right atrial pressure of 8 mmHg.  8. Left atrial size was moderately dilated.  9. Right atrial size was mildly dilated. Comparison(s): Significant change in LVEF, mitral regurgitation, and LV size from prior. FINDINGS  Left Ventricle: Papillary muscle hypertrophy. Left ventricular ejection fraction, by estimation, is 20%. The left ventricle has severely decreased function. The left ventricle demonstrates global hypokinesis. The left ventricular internal cavity size was mildly dilated. Right Ventricle: The right ventricular size is normal. No increase in right ventricular wall thickness. Right ventricular systolic function is moderately reduced. There is moderately elevated pulmonary artery systolic pressure. The tricuspid regurgitant velocity is 3.30 m/s, and with an assumed right atrial pressure of 8 mmHg, the estimated right ventricular systolic pressure is 53.6 mmHg. Left Atrium: Left atrial size was moderately dilated. Right Atrium: Right atrial size was mildly dilated. Pericardium: Trivial pericardial effusion is present. Mitral Valve: The mitral valve is degenerative in appearance. Moderate to severe mitral annular calcification.  Severe mitral valve regurgitation. Tricuspid Valve: Tricuspid valve regurgitation is mild to moderate. Aortic Valve: The aortic valve is tricuspid. There is mild calcification of the aortic valve. There is mild thickening of the aortic valve. There is mild aortic valve annular calcification. Aortic valve regurgitation is mild. Aortic regurgitation PHT measures 386 msec. Aortic valve sclerosis is present, with no evidence of aortic valve  stenosis. Pulmonic Valve: The pulmonic valve was normal in structure. Pulmonic valve regurgitation is trivial. No evidence of pulmonic stenosis. Venous: The inferior vena cava is dilated in size with greater than 50% respiratory variability, suggesting right atrial pressure of 8 mmHg. Additional Comments: There is a large pleural effusion in the left lateral region. LEFT VENTRICLE PLAX 2D LVIDd:         6.00 cm LVIDs:         5.50 cm LV PW:         1.10 cm LV IVS:        0.90 cm  LV Volumes (MOD) LV vol d, MOD A2C: 150.0 ml LV vol d, MOD A4C: 135.0 ml LV vol s, MOD A2C: 105.0 ml LV vol s, MOD A4C: 117.0 ml LV SV MOD A2C:     45.0 ml LV SV MOD A4C:     135.0 ml LV SV MOD BP:      29.5 ml RIGHT VENTRICLE             IVC RV S prime:     12.20 cm/s  IVC diam: 2.30 cm TAPSE (M-mode): 1.4 cm AORTIC VALVE          PULMONIC VALVE AI PHT:      386 msec PR End Diast Vel: 7.29 msec  MR Peak grad: 101.6 mmHg  TRICUSPID VALVE MR Mean grad: 65.0 mmHg   TR Peak grad:   43.6 mmHg MR Vmax:      504.00 cm/s TR Vmax:        330.00 cm/s MR Vmean:     384.0 cm/s Rudean Haskell MD Electronically signed by Rudean Haskell MD Signature Date/Time: 03/29/2022/4:59:31 PM    Final     Cardiac Studies   Echo:  04/09/22     1. Left ventricular ejection fraction, by estimation, is 20%. The left  ventricle has severely decreased function. The left ventricle demonstrates  global hypokinesis. No LV thrombus seen on non contrast study. Mildly  dilated left ventricle.   2. Right ventricular systolic  function is moderately reduced. There is  mid and apical hypokinesis but preserved basal function. Normal size and  thickness. RVSP 52 mm Hg.   3. Large pleural effusion in the left lateral region.   4. The mitral valve is degenerative. Severe mitral valve regurgitation  Ventricular functional etiology.      Moderate to severe mitral annular calcification   5. Tricuspid valve regurgitation is mild to moderate.   6. The aortic valve is tricuspid. There is mild calcification of the  aortic valve. There is mild thickening of the aortic valve. Aortic valve  regurgitation is mild. Aortic valve sclerosis is present, with no evidence  of aortic valve stenosis.   7. The inferior vena cava is dilated in size with >50% respiratory  variability, suggesting right atrial pressure of 8 mmHg.   8. Left atrial size was moderately dilated.   9. Right atrial size was mildly dilated.   Patient Profile     88 y.o. male with a hx of CAD status post LAD and left circumflex PCI (05/2019), ischemic cardiomyopathy (LVEF 25%--> 40 to 45% %), PAF, GI bleed, AVMs, who is being seen 03/29/2022 for the evaluation of ventricular tachycardia at the request of Dr. Waldron Labs.    Assessment & Plan    Acute systolic and diastolic HF:  EF is newly reduced compared to EF on October 2022 (40 - 45%.)  BP is labile.  Creat does  not allow ARB/ARNI.  Might be able to start BiDil if BP allows in the days ahead.   Severe MR:  New since echo previous .  Medical management.  I suspect secondary MR and will follow    Ventricular tachycardia:  On IV amiodarone.  Metoprolol started.  (Had been discontinued in the past secondary to bradycardia.)  Continue for now and then possibly change to PO in the AM.   AKI on CKD IIIA:   Creat continues to rise slowly.  Continue current dose of diuresis.     CAD:  Acute ischemia not thought to be the primary issue.    For questions or updates, please contact [facility] Please consult  [email or web address] for contact info under Cardiology/STEMI.   Signed, Minus Breeding, MD  03/30/2022, 7:38 AM

## 2022-03-30 NOTE — Progress Notes (Signed)
CPT not done at this time. Patient is sleeping. RN notified.

## 2022-03-30 NOTE — Progress Notes (Signed)
CPT not done at this time. Patient is sleeping.

## 2022-03-30 NOTE — Progress Notes (Signed)
PT Cancellation Note  Patient Details Name: Ronnie Joseph MRN: [id number] DOB: 04-19-1932   Cancelled Treatment:    Reason Eval/Treat Not Completed: Patient not medically ready (and pt declined attempting anything at this time due to respiratory difficulty) Pt on HHFNC 35L, 100% with sats 88-92%   Criselda Starke B Corban Kistler 03/30/2022, 10:04 AM Bayard Males, PT Acute Rehabilitation Services Office: [phone]

## 2022-03-31 DIAGNOSIS — I480 Paroxysmal atrial fibrillation: Secondary | ICD-10-CM | POA: Diagnosis not present

## 2022-03-31 DIAGNOSIS — A419 Sepsis, unspecified organism: Secondary | ICD-10-CM

## 2022-03-31 DIAGNOSIS — Z515 Encounter for palliative care: Secondary | ICD-10-CM | POA: Diagnosis not present

## 2022-03-31 DIAGNOSIS — J9601 Acute respiratory failure with hypoxia: Secondary | ICD-10-CM | POA: Diagnosis not present

## 2022-03-31 DIAGNOSIS — Z7189 Other specified counseling: Secondary | ICD-10-CM | POA: Diagnosis not present

## 2022-03-31 DIAGNOSIS — I5023 Acute on chronic systolic (congestive) heart failure: Secondary | ICD-10-CM | POA: Diagnosis not present

## 2022-03-31 DIAGNOSIS — Z66 Do not resuscitate: Secondary | ICD-10-CM

## 2022-03-31 DIAGNOSIS — J189 Pneumonia, unspecified organism: Secondary | ICD-10-CM | POA: Diagnosis not present

## 2022-03-31 DIAGNOSIS — R6520 Severe sepsis without septic shock: Secondary | ICD-10-CM

## 2022-03-31 LAB — POTASSIUM: Potassium: 4.4 mmol/L (ref 3.5–5.1)

## 2022-03-31 LAB — BASIC METABOLIC PANEL
Anion gap: 12 (ref 5–15)
BUN: 49 mg/dL — ABNORMAL HIGH (ref 8–23)
CO2: 26 mmol/L (ref 22–32)
Calcium: 8.9 mg/dL (ref 8.9–10.3)
Chloride: 104 mmol/L (ref 98–111)
Creatinine, Ser: 1.94 mg/dL — ABNORMAL HIGH (ref 0.61–1.24)
GFR, Estimated: 33 mL/min — ABNORMAL LOW (ref >60)
Glucose, Bld: 143 mg/dL — ABNORMAL HIGH (ref 70–99)
Potassium: 5.6 mmol/L — ABNORMAL HIGH (ref 3.5–5.1)
Sodium: 142 mmol/L (ref 135–145)

## 2022-03-31 LAB — CBC
HCT: 34.7 % — ABNORMAL LOW (ref 39.0–52.0)
Hemoglobin: 11.3 g/dL — ABNORMAL LOW (ref 13.0–17.0)
MCH: 33.1 pg (ref 26.0–34.0)
MCHC: 32.6 g/dL (ref 30.0–36.0)
MCV: 101.8 fL — ABNORMAL HIGH (ref 80.0–100.0)
Platelets: 744 10*3/uL — ABNORMAL HIGH (ref 150–400)
RBC: 3.41 MIL/uL — ABNORMAL LOW (ref 4.22–5.81)
RDW: 13.4 % (ref 11.5–15.5)
WBC: 24.3 10*3/uL — ABNORMAL HIGH (ref 4.0–10.5)
nRBC: 0 % (ref 0.0–0.2)

## 2022-03-31 LAB — GLUCOSE, CAPILLARY
Glucose-Capillary: 176 mg/dL — ABNORMAL HIGH (ref 70–99)
Glucose-Capillary: 142 mg/dL — ABNORMAL HIGH (ref 70–99)

## 2022-03-31 MED ORDER — ATROPINE SULFATE 1 % OP SOLN: 4.0000 [drp] | OPHTHALMIC | Status: DC | PRN

## 2022-03-31 MED ORDER — AMIODARONE HCL 200 MG PO TABS
400.0000 mg | ORAL_TABLET | Freq: Two times a day (BID) | ORAL | Status: DC
  Administered 2022-03-31: 400 mg via ORAL
  Filled 2022-03-31: qty 2

## 2022-03-31 MED ORDER — MORPHINE 100MG IN NS 100ML (1MG/ML) PREMIX INFUSION
2.0000 mg/h | INTRAVENOUS | Status: DC
  Administered 2022-03-31: 2 mg/h via INTRAVENOUS
  Filled 2022-03-31: qty 100

## 2022-03-31 MED ORDER — GLYCOPYRROLATE 0.2 MG/ML IJ SOLN
0.2000 mg | INTRAMUSCULAR | Status: DC
Start: 2022-03-31 — End: 2022-03-31
  Administered 2022-03-31: 0.2 mg via INTRAVENOUS
  Filled 2022-03-31: qty 1

## 2022-03-31 MED ORDER — BIOTENE DRY MOUTH MT LIQD: 15.0000 mL | OROMUCOSAL | Status: DC | PRN

## 2022-03-31 MED ORDER — FUROSEMIDE 10 MG/ML IJ SOLN
40.0000 mg | Freq: Two times a day (BID) | INTRAMUSCULAR | Status: DC
  Administered 2022-03-31 (×2): 40 mg via INTRAVENOUS
  Filled 2022-03-31: qty 4

## 2022-03-31 MED ORDER — ACETAMINOPHEN 650 MG RE SUPP: 650.0000 mg | Freq: Four times a day (QID) | RECTAL | Status: DC | PRN

## 2022-03-31 MED ORDER — ONDANSETRON HCL 4 MG/2ML IJ SOLN: 4.0000 mg | Freq: Four times a day (QID) | INTRAMUSCULAR | Status: DC | PRN

## 2022-03-31 MED ORDER — MELATONIN 5 MG PO TABS
5.0000 mg | ORAL_TABLET | Freq: Every evening | ORAL | Status: DC | PRN
Start: 2022-03-31 — End: 2022-04-01

## 2022-03-31 MED ORDER — MORPHINE BOLUS VIA INFUSION
2.0000 mg | INTRAVENOUS | Status: DC | PRN
  Administered 2022-03-31 – 2022-04-01 (×3): 2 mg via INTRAVENOUS

## 2022-03-31 MED ORDER — HALOPERIDOL LACTATE 5 MG/ML IJ SOLN: 0.5000 mg | INTRAMUSCULAR | Status: DC | PRN

## 2022-03-31 MED ORDER — SODIUM ZIRCONIUM CYCLOSILICATE 10 G PO PACK
10.0000 g | PACK | Freq: Once | ORAL | Status: DC
Start: 2022-03-31 — End: 2022-03-31
  Filled 2022-03-31: qty 1

## 2022-03-31 MED ORDER — POLYVINYL ALCOHOL 1.4 % OP SOLN: 1.0000 [drp] | Freq: Four times a day (QID) | OPHTHALMIC | Status: DC | PRN

## 2022-03-31 NOTE — Progress Notes (Signed)
CPT not done at this time per patient request. 

## 2022-03-31 NOTE — Progress Notes (Signed)
PROGRESS NOTE    Ronnie Joseph  ITG:[id number] DOB: 08-02-1932 DOA: 03/27/2022 PCP: [name], NP   Brief Narrative:   Ronnie Joseph is a 88 y.o. male with medical history significant of CAD status post PCI 05/2019, ischemic cardiomyopathy, paroxysmal A-fib on Eliquis, hypertension, hyperlipidemia, mild cognitive impairment, type 2 diabetes, hypothyroidism, OSA, chronic cough, GERD, iron deficiency anemia due to chronic blood loss from AVMs and GI advised against repeat endoscopic procedures given his age. Recently seen in the ED on 6/6 for fevers, chills, and generalized weakness.  He was diagnosed with pneumonia (chest x-ray showing hazy right infrahilar opacity) and discharged on Augmentin and azithromycin. -Returned to ED with hypoxia, no improvement of oral antibiotics, admitted for up pneumonia treatment, chest x-ray significant with multifocal pneumonia, with significant oxygen requirement, treated with vancomycin, Zosyn and cefepime, started to improve, patient developed multiple episodes of ventricular tachycardia, transferred to heart on 6/18, repeat echo was significant for aortic valve disease and EF at 20%.  Assessment & Plan:   Principal Problem:   Multifocal pneumonia Active Problems:   Acquired hypothyroidism   Atrial fibrillation (HCC)   Severe sepsis (HCC)   Hypercholesterolemia   Acute respiratory failure with hypoxia (HCC)   Hypokalemia   Ventricular tachycardia (HCC)   Acute on chronic systolic heart failure (HCC)  Severe sepsis and acute hypoxic respiratory failure secondary to multifocal community-acquired pneumonia:  - Patient met severe sepsis criteria based on tachypnea and leukocytosis as well as acute hypoxia.  Evidence of pneumonia on the imaging study.  He failed outpatient antibiotic therapy.  Urine antigen for Legionella but Streptococcus negative.  Lactic acid normal.  COVID negative.   -Screen MRSA is negative, stopped IV vancomycin. -Treated with IV  cefepime, and IV azithromycin IV azithromycin has been discontinued given prolonged QTc -Patient was encouraged with incentive spirometry and flutter valve. -Patient continued to have worsening hypoxia and oxygen requirement, requiring heated high flow nasal cannula 35 L at 100% today.  Acute on chronic combined systolic and diastolic congestive heart failure:  -BNP significantly elevated, with volume overload. -Diuresis by cardiology, -1 L over last 24 hours with 120 mg of IV Lasix total, transition to p.o. 6 today given worsening renal function and low blood pressure -Pete 2D echo with significant drop in EF to 20%, with global hypokinesis  Ventricular  tachycardia -Cardiology input greatly appreciated, he is on p.o. amiodarone at home, currently started on amiodarone drip. -He is started on beta-blockers as well. -Agement per cardiology. -Keep potassium> 4, and magnesium> 2.  Hyperkalemia -Potassium of 5.6 today, will give Lokelma   Mild hypokalemia:  Replaced and resolved.  Severe MR -Management per cardiology  CAD s/p PCI/hyperlipidemia: No ACS symptoms.  Continue statin and aspirin.  Paroxysmal atrial fibrillation: Currently in sinus rhythm.  Continue amiodarone and Eliquis.  History of essential hypertension: Blood pressure on the low side.   now he is on beta-blockers for ventricular tachycardia  AKI on CKD stage IIIa: -Creatinine has been trending up, likely due to cardiorenal  Non-insulin-dependent type 2 diabetes mellitus:  - Resumed Jardiance.  Continue SSI.  Blood sugar controlled.  Acquired hypothyroidism: Continue Synthroid.  GERD: Continue PPI.  Chronic blood loss anemia/iron deficiency anemia: Due to chronic blood loss from AVMs and GI advised against repeat endoscopic procedures given his age.  He is followed by hematology and continues to be on Eliquis for A-fib.  Hemoglobin currently 10.7, was 11.4 on 03/17/2022. -Continue iron supplement -Continue to  monitor hemoglobin  Generalized weakness: PT OT consulted.  Goals of care -I have discussed with the patient earlier today, and with the daughter later by phone, I have informed him overall patient with very poor prognosis in the setting of his significantly worsening hypoxia and increased oxygen requirement, and new cardiac findings EF of 20%, and severe MR, as well him having frequent episode of ventricular tachycardia, patient appears uncomfortable, we discussed about palliative care, and daughter is interested, as I have informed him prognosis is very poor especially with new findings of his worsening hypoxia, heart failure and valve disease, and likely for comfort measures if no significant improvement.  DVT prophylaxis: apixaban (ELIQUIS) tablet 2.5 mg Start: 03/23/22 1000   Code Status: Partial Code  Family Communication: Discussed with daughter by phone on 6/19 Status is: Inpatient Remains inpatient appropriate because: Patient sick with significant hypoxia  Estimated body mass index is 22.9 kg/m as calculated from the following:   Height as of this encounter: '5\' 10"'  (1.778 m).   Weight as of this encounter: 72.4 kg.    Nutritional Assessment: Body mass index is 22.9 kg/m.Marland Kitchen Seen by dietician.  I agree with the assessment and plan as outlined below: Nutrition Status:        . Skin Assessment: I have examined the patient's skin and I agree with the wound assessment as performed by the wound care RN as outlined below:    Consultants:  Cardiology Palliaitve  Procedures:  None  Antimicrobials:  Anti-infectives (From admission, onward)    Start     Dose/Rate Route Frequency Ordered Stop   03/30/22 0822  ceFEPIme (MAXIPIME) 2 g in sodium chloride 0.9 % 100 mL IVPB        2 g 200 mL/hr over 30 Minutes Intravenous Every 24 hours 03/29/22 0841     03/26/22 1730  azithromycin (ZITHROMAX) 500 mg in sodium chloride 0.9 % 250 mL IVPB  Status:  Discontinued        500 mg 250  mL/hr over 60 Minutes Intravenous Every 24 hours 03/26/22 1650 03/30/22 0657   03/24/22 0500  vancomycin (VANCOREADY) IVPB 750 mg/150 mL  Status:  Discontinued        750 mg 150 mL/hr over 60 Minutes Intravenous Every 24 hours 03/23/22 0515 03/24/22 1051   03/23/22 1600  ceFEPIme (MAXIPIME) 2 g in sodium chloride 0.9 % 100 mL IVPB  Status:  Discontinued        2 g 200 mL/hr over 30 Minutes Intravenous Every 12 hours 03/23/22 0515 03/29/22 0841   03/23/22 0315  ceFEPIme (MAXIPIME) 2 g in sodium chloride 0.9 % 100 mL IVPB        2 g 200 mL/hr over 30 Minutes Intravenous  Once 03/23/22 0307 03/23/22 0459   03/23/22 0315  vancomycin (VANCOREADY) IVPB 1500 mg/300 mL        1,500 mg 150 mL/hr over 120 Minutes Intravenous  Once 03/23/22 0307 03/23/22 0724         Subjective:  Patient reports weakness, fatigue, dyspnea at baseline, he denies any chest pain.   Objective: Vitals:   03/31/22 0740 03/31/22 0800 03/31/22 0900 03/31/22 1000  BP:  (!) 115/58 (!) 101/48 (!) 116/45  Pulse: 62 62 64 62  Resp: (!) 28 (!) 32 (!) 31 (!) 24  Temp:  99.7 F (37.6 C)    TempSrc:  Axillary    SpO2: 100% 100% 97% 100%  Weight:      Height:  Intake/Output Summary (Last 24 hours) at 03/31/2022 1205 Last data filed at 03/31/2022 1000 Gross per 24 hour  Intake 690.54 ml  Output 1775 ml  Net -1084.46 ml   Filed Weights   03/29/22 0500 03/29/22 1828 03/31/22 0351  Weight: 72.9 kg 71.7 kg 72.4 kg    Examination:  Awake Alert, Oriented X 3, ill-appearing, deconditioned, uncomfortable  Symmetrical Chest wall movement, diminished air entry at the bases with scattered Rales RRR,No Gallops,Rubs or new Murmurs, No Parasternal Heave +ve B.Sounds, Abd Soft, No tenderness, No rebound - guarding or rigidity. No Cyanosis, Clubbing or edema, No new Rash or bruise        Data Reviewed: I have personally reviewed following labs and imaging studies  CBC: Recent Labs  Lab 03/27/22 0049  03/28/22 0043 03/29/22 0107 03/30/22 0036 03/31/22 0057  WBC 13.2* 11.4* 12.8* 25.8* 24.3*  HGB 10.1* 10.0* 10.4* 12.6* 11.3*  HCT 31.0* 29.6* 31.7* 38.0* 34.7*  MCV 103.7* 102.4* 102.3* 101.9* 101.8*  PLT 522* 585* 708* 822* 161*   Basic Metabolic Panel: Recent Labs  Lab 03/26/22 0120 03/27/22 0049 03/28/22 0043 03/29/22 0107 03/30/22 0036 03/31/22 0057 03/31/22 0921  NA 139 140 137 141 142 142  --   K 3.8 4.0 4.0 3.5 4.7 5.6* 4.4  CL 105 104 104 103 107 104  --   CO2 '24 23 23 23 ' 21* 26  --   GLUCOSE 99 115* 92 130* 184* 143*  --   BUN '19 21 20 ' 33* 38* 49*  --   CREATININE 1.38* 1.41* 1.52* 1.72* 1.80* 1.94*  --   CALCIUM 8.0* 8.3* 8.0* 8.5* 8.7* 8.9  --   MG 2.3  --   --  2.5* 3.3*  --   --    GFR: Estimated Creatinine Clearance: 27 mL/min (A) (by C-G formula based on SCr of 1.94 mg/dL (H)). Liver Function Tests: No results for input(s): "AST", "ALT", "ALKPHOS", "BILITOT", "PROT", "ALBUMIN" in the last 168 hours.  No results for input(s): "LIPASE", "AMYLASE" in the last 168 hours. No results for input(s): "AMMONIA" in the last 168 hours. Coagulation Profile: No results for input(s): "INR", "PROTIME" in the last 168 hours. Cardiac Enzymes: No results for input(s): "CKTOTAL", "CKMB", "CKMBINDEX", "TROPONINI" in the last 168 hours. BNP (last 3 results) No results for input(s): "PROBNP" in the last 8760 hours. HbA1C: No results for input(s): "HGBA1C" in the last 72 hours.  CBG: Recent Labs  Lab 03/30/22 1309 03/30/22 1558 03/30/22 2140 03/31/22 0654 03/31/22 0917  GLUCAP 246* 206* 199* 176* 142*   Lipid Profile: No results for input(s): "CHOL", "HDL", "LDLCALC", "TRIG", "CHOLHDL", "LDLDIRECT" in the last 72 hours. Thyroid Function Tests: No results for input(s): "TSH", "T4TOTAL", "FREET4", "T3FREE", "THYROIDAB" in the last 72 hours. Anemia Panel: No results for input(s): "VITAMINB12", "FOLATE", "FERRITIN", "TIBC", "IRON", "RETICCTPCT" in the last 72  hours. Sepsis Labs: Recent Labs  Lab 03/27/22 0049 03/28/22 0043  PROCALCITON 0.30 0.20    Recent Results (from the past 240 hour(s))  Blood culture (routine x 2)     Status: None   Collection Time: 03/23/22  3:44 AM   Specimen: BLOOD  Result Value Ref Range Status   Specimen Description BLOOD SITE NOT SPECIFIED  Final   Special Requests   Final    BOTTLES DRAWN AEROBIC AND ANAEROBIC Blood Culture adequate volume   Culture   Final    NO GROWTH 5 DAYS Performed at [facility], [address] [place] [address], [place], [place]  [address]    Report Status 03/28/2022 FINAL  Final  Blood culture (routine x 2)     Status: None   Collection Time: 03/23/22  3:49 AM   Specimen: BLOOD  Result Value Ref Range Status   Specimen Description BLOOD SITE NOT SPECIFIED  Final   Special Requests   Final    BOTTLES DRAWN AEROBIC AND ANAEROBIC Blood Culture adequate volume   Culture   Final    NO GROWTH 5 DAYS Performed at [facility], [address], [place], [place] [address]    Report Status 03/28/2022 FINAL  Final  SARS Coronavirus 2 by RT PCR (hospital order, performed in [facility] hospital lab) *cepheid single result test*     Status: None   Collection Time: 03/23/22  9:27 AM  Result Value Ref Range Status   SARS Coronavirus 2 by RT PCR NEGATIVE NEGATIVE Final    Comment: (NOTE) SARS-CoV-2 target nucleic acids are NOT DETECTED.  The SARS-CoV-2 RNA is generally detectable in upper and lower respiratory specimens during the acute phase of infection. The lowest concentration of SARS-CoV-2 viral copies this assay can detect is 250 copies / mL. A negative result does not preclude SARS-CoV-2 infection and should not be used as the sole basis for treatment or other patient management decisions.  A negative result may occur with improper specimen collection / handling, submission of specimen other than nasopharyngeal swab, presence of viral mutation(s) within the areas targeted by this  assay, and inadequate number of viral copies (<250 copies / mL). A negative result must be combined with clinical observations, patient history, and epidemiological information.  Fact Sheet for Patients:   https://www.patel.info/  Fact Sheet for Healthcare Providers: https://hall.com/  This test is not yet approved or  cleared by the [place] FDA and has been authorized for detection and/or diagnosis of SARS-CoV-2 by FDA under an Emergency Use Authorization (EUA).  This EUA will remain in effect (meaning this test can be used) for the duration of the COVID-19 declaration under Section 564(b)(1) of the Act, 21 U.S.C. section 360bbb-3(b)(1), unless the authorization is terminated or revoked sooner.  Performed at [facility], [place] [address], [place], [place] [address]   MRSA Next Gen by PCR, Nasal     Status: None   Collection Time: 03/23/22  9:27 AM  Result Value Ref Range Status   MRSA by PCR Next Gen NOT DETECTED NOT DETECTED Final    Comment: (NOTE) The GeneXpert MRSA Assay (FDA approved for NASAL specimens only), is one component of a comprehensive MRSA colonization surveillance program. It is not intended to diagnose MRSA infection nor to guide or monitor treatment for MRSA infections. Test performance is not FDA approved in patients less than 72 years old. Performed at [facility], [place] [address], [place], [place] [address]      Radiology Studies: DG CHEST PORT 1 VIEW  Result Date: 03/30/2022 CLINICAL DATA:  Shortness of breath EXAM: PORTABLE CHEST 1 VIEW COMPARISON:  03/26/2022 FINDINGS: Cardiac shadow is enlarged but stable. Aortic calcifications are noted. Lungs are well aerated bilaterally. Previously seen multifocal pneumonia is again identified. Significant increased central vascular congestion is noted without edema consistent with superimposed CHF. No sizable effusion is noted. No acute bony abnormality is  seen. Old fourth right rib fracture is noted. IMPRESSION: Stable multifocal pneumonia. Superimposed central vascular congestion Electronically Signed   By: Inez Catalina M.D.   On: 03/30/2022 22:27   ECHOCARDIOGRAM LIMITED  Result Date: 03/29/2022    ECHOCARDIOGRAM LIMITED REPORT   Patient Name:   Ronnie Joseph Date of Exam: 03/29/2022 Medical Rec #:  [id number]   Height:       68.0 in Accession #:    [id number]  Weight:       160.7 lb Date of Birth:  17-Feb-1932   BSA:          1.862 m Patient Age:    70 years    BP:           136/71 mmHg Patient Gender: M           HR:           96 bpm. Exam Location:  Inpatient Procedure: Limited Echo and Limited Color Doppler STAT ECHO Indications:    Ventricular tachycardia  History:        Patient has prior history of Echocardiogram examinations, most                 recent 07/15/2021.  Sonographer:    Joette Catching RCS Referring Phys: [phone] TIFFANY [place] IMPRESSIONS  1. Left ventricular ejection fraction, by estimation, is 20%. The left ventricle has severely decreased function. The left ventricle demonstrates global hypokinesis. No LV thrombus seen on non contrast study. Mildly dilated left ventricle.  2. Right ventricular systolic function is moderately reduced. There is mid and apical hypokinesis but preserved basal function. Normal size and thickness. RVSP 52 mm Hg.  3. Large pleural effusion in the left lateral region.  4. The mitral valve is degenerative. Severe mitral valve regurgitation Ventricular functional etiology.     Moderate to severe mitral annular calcification  5. Tricuspid valve regurgitation is mild to moderate.  6. The aortic valve is tricuspid. There is mild calcification of the aortic valve. There is mild thickening of the aortic valve. Aortic valve regurgitation is mild. Aortic valve sclerosis is present, with no evidence of aortic valve stenosis.  7. The inferior vena cava is dilated in size with >50% respiratory variability, suggesting right  atrial pressure of 8 mmHg.  8. Left atrial size was moderately dilated.  9. Right atrial size was mildly dilated. Comparison(s): Significant change in LVEF, mitral regurgitation, and LV size from prior. FINDINGS  Left Ventricle: Papillary muscle hypertrophy. Left ventricular ejection fraction, by estimation, is 20%. The left ventricle has severely decreased function. The left ventricle demonstrates global hypokinesis. The left ventricular internal cavity size was mildly dilated. Right Ventricle: The right ventricular size is normal. No increase in right ventricular wall thickness. Right ventricular systolic function is moderately reduced. There is moderately elevated pulmonary artery systolic pressure. The tricuspid regurgitant velocity is 3.30 m/s, and with an assumed right atrial pressure of 8 mmHg, the estimated right ventricular systolic pressure is 67.2 mmHg. Left Atrium: Left atrial size was moderately dilated. Right Atrium: Right atrial size was mildly dilated. Pericardium: Trivial pericardial effusion is present. Mitral Valve: The mitral valve is degenerative in appearance. Moderate to severe mitral annular calcification. Severe mitral valve regurgitation. Tricuspid Valve: Tricuspid valve regurgitation is mild to moderate. Aortic Valve: The aortic valve is tricuspid. There is mild calcification of the aortic valve. There is mild thickening of the aortic valve. There is mild aortic valve annular calcification. Aortic valve regurgitation is mild. Aortic regurgitation PHT measures 386 msec. Aortic valve sclerosis is present, with no evidence of aortic valve stenosis. Pulmonic Valve: The pulmonic valve was normal in structure. Pulmonic valve regurgitation is trivial. No evidence of pulmonic stenosis. Venous:  The inferior vena cava is dilated in size with greater than 50% respiratory variability, suggesting right atrial pressure of 8 mmHg. Additional Comments: There is a large pleural effusion in the left lateral  region. LEFT VENTRICLE PLAX 2D LVIDd:         6.00 cm LVIDs:         5.50 cm LV PW:         1.10 cm LV IVS:        0.90 cm  LV Volumes (MOD) LV vol d, MOD A2C: 150.0 ml LV vol d, MOD A4C: 135.0 ml LV vol s, MOD A2C: 105.0 ml LV vol s, MOD A4C: 117.0 ml LV SV MOD A2C:     45.0 ml LV SV MOD A4C:     135.0 ml LV SV MOD BP:      29.5 ml RIGHT VENTRICLE             IVC RV S prime:     12.20 cm/s  IVC diam: 2.30 cm TAPSE (M-mode): 1.4 cm AORTIC VALVE          PULMONIC VALVE AI PHT:      386 msec PR End Diast Vel: 7.29 msec  MR Peak grad: 101.6 mmHg  TRICUSPID VALVE MR Mean grad: 65.0 mmHg   TR Peak grad:   43.6 mmHg MR Vmax:      504.00 cm/s TR Vmax:        330.00 cm/s MR Vmean:     384.0 cm/s Rudean Haskell MD Electronically signed by Rudean Haskell MD Signature Date/Time: 03/29/2022/4:59:31 PM    Final     Scheduled Meds:  amiodarone  400 mg Oral BID   apixaban  2.5 mg Oral BID   aspirin  81 mg Oral Daily   Chlorhexidine Gluconate Cloth  6 each Topical Daily   empagliflozin  10 mg Oral QAC breakfast   ferrous sulfate  325 mg Oral Q breakfast   furosemide  40 mg Intravenous BID   guaiFENesin  600 mg Oral BID   insulin aspart  0-5 Units Subcutaneous QHS   insulin aspart  0-9 Units Subcutaneous TID WC   levothyroxine  88 mcg Oral Q0600   loratadine  10 mg Oral Daily   melatonin  5 mg Oral QHS   methylPREDNISolone (SOLU-MEDROL) injection  40 mg Intravenous Daily   metoprolol tartrate  25 mg Oral BID   montelukast  10 mg Oral Daily   pantoprazole  40 mg Oral Daily   rosuvastatin  20 mg Oral Daily   sodium zirconium cyclosilicate  10 g Oral Once   Continuous Infusions:  ceFEPime (MAXIPIME) IV Stopped (03/31/22 0931)     LOS: 8 days   Phillips Climes, MD [facility]  03/31/2022, 12:05 PM

## 2022-03-31 NOTE — Progress Notes (Signed)
Progress Note  Patient Name: Ronnie Joseph Date of Encounter: 03/31/2022  Primary Cardiologist:   Donato Heinz, MD   Subjective   Decreased sats last night noted.  No acute complaints this morning.   Inpatient Medications    Scheduled Meds:  apixaban  2.5 mg Oral BID   aspirin  81 mg Oral Daily   Chlorhexidine Gluconate Cloth  6 each Topical Daily   empagliflozin  10 mg Oral QAC breakfast   ferrous sulfate  325 mg Oral Q breakfast   furosemide  40 mg Intravenous Daily   guaiFENesin  600 mg Oral BID   insulin aspart  0-5 Units Subcutaneous QHS   insulin aspart  0-9 Units Subcutaneous TID WC   levothyroxine  88 mcg Oral Q0600   loratadine  10 mg Oral Daily   melatonin  5 mg Oral QHS   methylPREDNISolone (SOLU-MEDROL) injection  40 mg Intravenous Daily   metoprolol tartrate  25 mg Oral BID   montelukast  10 mg Oral Daily   pantoprazole  40 mg Oral Daily   rosuvastatin  20 mg Oral Daily   sodium zirconium cyclosilicate  10 g Oral Once   Continuous Infusions:  amiodarone 30 mg/hr (03/31/22 0657)   ceFEPime (MAXIPIME) IV Stopped (03/30/22 0911)   PRN Meds: albuterol, benzonatate, diphenhydrAMINE, lip balm, morphine injection   Vital Signs    Vitals:   03/31/22 0500 03/31/22 0600 03/31/22 0700 03/31/22 0800  BP: (!) 111/41 (!) 113/49 (!) 124/48   Pulse: 61 67 71   Resp: (!) 26 (!) 30 (!) 30   Temp:    99.7 F (37.6 C)  TempSrc:    Axillary  SpO2: 99% 98% 98%   Weight:      Height:        Intake/Output Summary (Last 24 hours) at 03/31/2022 0803 Last data filed at 03/31/2022 6384 Gross per 24 hour  Intake 1198.64 ml  Output 2125 ml  Net -926.36 ml   Filed Weights   03/29/22 0500 03/29/22 1828 03/31/22 0351  Weight: 72.9 kg 71.7 kg 72.4 kg    Telemetry    NSR - Personally Reviewed  ECG    NA - Personally Reviewed  Physical Exam   GEN:    Frail and acutely ill Neck:    Positive JVD Cardiac: RRR, Distant heart sounds Respiratory:      Decreased breath sounds with left greater than right coarse crackles GI: Soft, nontender, non-distended, normal bowel sounds  MS:  Trace edema; No deformity. Neuro:   Nonfocal  Psych: Oriented and appropriate    Labs    Chemistry Recent Labs  Lab 03/29/22 0107 03/30/22 0036 03/31/22 0057  NA 141 142 142  K 3.5 4.7 5.6*  CL 103 107 104  CO2 23 21* 26  GLUCOSE 130* 184* 143*  BUN 33* 38* 49*  CREATININE 1.72* 1.80* 1.94*  CALCIUM 8.5* 8.7* 8.9  GFRNONAA 38* 36* 33*  ANIONGAP '15 14 12     '$ Hematology Recent Labs  Lab 03/29/22 0107 03/30/22 0036 03/31/22 0057  WBC 12.8* 25.8* 24.3*  RBC 3.10* 3.73* 3.41*  HGB 10.4* 12.6* 11.3*  HCT 31.7* 38.0* 34.7*  MCV 102.3* 101.9* 101.8*  MCH 33.5 33.8 33.1  MCHC 32.8 33.2 32.6  RDW 13.2 13.3 13.4  PLT 708* 822* 744*    Cardiac EnzymesNo results for input(s): "TROPONINI" in the last 168 hours. No results for input(s): "TROPIPOC" in the last 168 hours.   BNP Recent Labs  Lab 03/28/22 0043  BNP 1,219.1*     DDimer No results for input(s): "DDIMER" in the last 168 hours.   Radiology    DG CHEST PORT 1 VIEW  Result Date: 03/30/2022 CLINICAL DATA:  Shortness of breath EXAM: PORTABLE CHEST 1 VIEW COMPARISON:  03/26/2022 FINDINGS: Cardiac shadow is enlarged but stable. Aortic calcifications are noted. Lungs are well aerated bilaterally. Previously seen multifocal pneumonia is again identified. Significant increased central vascular congestion is noted without edema consistent with superimposed CHF. No sizable effusion is noted. No acute bony abnormality is seen. Old fourth right rib fracture is noted. IMPRESSION: Stable multifocal pneumonia. Superimposed central vascular congestion Electronically Signed   By: Inez Catalina M.D.   On: 03/30/2022 22:27   ECHOCARDIOGRAM LIMITED  Result Date: 03/29/2022    ECHOCARDIOGRAM LIMITED REPORT   Patient Name:   Ronnie Joseph Date of Exam: 03/29/2022 Medical Rec #:  [id number]   Height:        68.0 in Accession #:    [id number]  Weight:       160.7 lb Date of Birth:  1932/07/23   BSA:          1.862 m Patient Age:    39 years    BP:           136/71 mmHg Patient Gender: M           HR:           96 bpm. Exam Location:  Inpatient Procedure: Limited Echo and Limited Color Doppler STAT ECHO Indications:    Ventricular tachycardia  History:        Patient has prior history of Echocardiogram examinations, most                 recent 07/15/2021.  Sonographer:    Joette Catching RCS Referring Phys: [phone] TIFFANY [place] IMPRESSIONS  1. Left ventricular ejection fraction, by estimation, is 20%. The left ventricle has severely decreased function. The left ventricle demonstrates global hypokinesis. No LV thrombus seen on non contrast study. Mildly dilated left ventricle.  2. Right ventricular systolic function is moderately reduced. There is mid and apical hypokinesis but preserved basal function. Normal size and thickness. RVSP 52 mm Hg.  3. Large pleural effusion in the left lateral region.  4. The mitral valve is degenerative. Severe mitral valve regurgitation Ventricular functional etiology.     Moderate to severe mitral annular calcification  5. Tricuspid valve regurgitation is mild to moderate.  6. The aortic valve is tricuspid. There is mild calcification of the aortic valve. There is mild thickening of the aortic valve. Aortic valve regurgitation is mild. Aortic valve sclerosis is present, with no evidence of aortic valve stenosis.  7. The inferior vena cava is dilated in size with >50% respiratory variability, suggesting right atrial pressure of 8 mmHg.  8. Left atrial size was moderately dilated.  9. Right atrial size was mildly dilated. Comparison(s): Significant change in LVEF, mitral regurgitation, and LV size from prior. FINDINGS  Left Ventricle: Papillary muscle hypertrophy. Left ventricular ejection fraction, by estimation, is 20%. The left ventricle has severely decreased function. The left  ventricle demonstrates global hypokinesis. The left ventricular internal cavity size was mildly dilated. Right Ventricle: The right ventricular size is normal. No increase in right ventricular wall thickness. Right ventricular systolic function is moderately reduced. There is moderately elevated pulmonary artery systolic pressure. The tricuspid regurgitant velocity is 3.30 m/s, and with an assumed right atrial pressure of 8 mmHg,  the estimated right ventricular systolic pressure is 28.7 mmHg. Left Atrium: Left atrial size was moderately dilated. Right Atrium: Right atrial size was mildly dilated. Pericardium: Trivial pericardial effusion is present. Mitral Valve: The mitral valve is degenerative in appearance. Moderate to severe mitral annular calcification. Severe mitral valve regurgitation. Tricuspid Valve: Tricuspid valve regurgitation is mild to moderate. Aortic Valve: The aortic valve is tricuspid. There is mild calcification of the aortic valve. There is mild thickening of the aortic valve. There is mild aortic valve annular calcification. Aortic valve regurgitation is mild. Aortic regurgitation PHT measures 386 msec. Aortic valve sclerosis is present, with no evidence of aortic valve stenosis. Pulmonic Valve: The pulmonic valve was normal in structure. Pulmonic valve regurgitation is trivial. No evidence of pulmonic stenosis. Venous: The inferior vena cava is dilated in size with greater than 50% respiratory variability, suggesting right atrial pressure of 8 mmHg. Additional Comments: There is a large pleural effusion in the left lateral region. LEFT VENTRICLE PLAX 2D LVIDd:         6.00 cm LVIDs:         5.50 cm LV PW:         1.10 cm LV IVS:        0.90 cm  LV Volumes (MOD) LV vol d, MOD A2C: 150.0 ml LV vol d, MOD A4C: 135.0 ml LV vol s, MOD A2C: 105.0 ml LV vol s, MOD A4C: 117.0 ml LV SV MOD A2C:     45.0 ml LV SV MOD A4C:     135.0 ml LV SV MOD BP:      29.5 ml RIGHT VENTRICLE             IVC RV S prime:      12.20 cm/s  IVC diam: 2.30 cm TAPSE (M-mode): 1.4 cm AORTIC VALVE          PULMONIC VALVE AI PHT:      386 msec PR End Diast Vel: 7.29 msec  MR Peak grad: 101.6 mmHg  TRICUSPID VALVE MR Mean grad: 65.0 mmHg   TR Peak grad:   43.6 mmHg MR Vmax:      504.00 cm/s TR Vmax:        330.00 cm/s MR Vmean:     384.0 cm/s Rudean Haskell MD Electronically signed by Rudean Haskell MD Signature Date/Time: 03/29/2022/4:59:31 PM    Final     Cardiac Studies   Echo:  04/09/22     1. Left ventricular ejection fraction, by estimation, is 20%. The left  ventricle has severely decreased function. The left ventricle demonstrates  global hypokinesis. No LV thrombus seen on non contrast study. Mildly  dilated left ventricle.   2. Right ventricular systolic function is moderately reduced. There is  mid and apical hypokinesis but preserved basal function. Normal size and  thickness. RVSP 52 mm Hg.   3. Large pleural effusion in the left lateral region.   4. The mitral valve is degenerative. Severe mitral valve regurgitation  Ventricular functional etiology.      Moderate to severe mitral annular calcification   5. Tricuspid valve regurgitation is mild to moderate.   6. The aortic valve is tricuspid. There is mild calcification of the  aortic valve. There is mild thickening of the aortic valve. Aortic valve  regurgitation is mild. Aortic valve sclerosis is present, with no evidence  of aortic valve stenosis.   7. The inferior vena cava is dilated in size with >50% respiratory  variability, suggesting right  atrial pressure of 8 mmHg.   8. Left atrial size was moderately dilated.   9. Right atrial size was mildly dilated.   Patient Profile     88 y.o. male with a hx of CAD status post LAD and left circumflex PCI (05/2019), ischemic cardiomyopathy (LVEF 25%--> 40 to 45% %), PAF, GI bleed, AVMs, who is being seen 03/29/2022 for the evaluation of ventricular tachycardia at the request of Dr. Waldron Labs.     Assessment & Plan    Acute systolic and diastolic HF:  EF is newly reduced compared to EF on October 2022 (40 - 45%.)   Net negative is 8.0 liters since admission and 1 liter negative yesterday.   Given total 120 Lasix yesterday.   With increased O2 requirements would continue to keep on the dry side with 40 IV BID Lasix.    Severe MR:  Medical management.    Atrial fib:  In NSR.   Continue Eliquis.   Ventricular tachycardia:  No further VT.  Will change to PO Lasix.   AKI on CKD IIIA:   Creat is up slightly.  Avoiding ARB/ARNi/Spironolactone.  BP is low not facilitating med titration.  He is tolerating [place] and low dose beta blocker.       CAD:  Continue medical management.    Hyperkalemia:    Mildly elevated.  No potassium supplement in several days.  Suggest repeat potassium.     For questions or updates, please contact [facility] Please consult [email or web address] for contact info under Cardiology/STEMI.   Signed, Minus Breeding, MD  03/31/2022, 8:03 AM

## 2022-03-31 NOTE — Consult Note (Cosign Needed)
Palliative Care Consult Note                                  Date: 03/31/2022   Patient Name: Ronnie Joseph  DOB: Nov 21, 1931  MRN: [id number]  Age / Sex: 88 y.o., male  PCP: [name], NP Referring Physician: Albertine Patricia, MD  Reason for Consultation: Establishing goals of care and Terminal Care  HPI/Patient Profile: 88 y.o. male  with past medical history of CAD status post PCI 05/2019, ischemic cardiomyopathy, paroxysmal A-fib on Eliquis, hypertension, hyperlipidemia, mild cognitive impairment, type 2 diabetes, hypothyroidism, OSA, chronic cough, GERD, iron deficiency anemia due to chronic blood loss from AVMs and GI advised against repeat endoscopic procedures given his age. Recently seen in the ED on 6/6 for fevers, chills, and generalized weakness.  He was diagnosed with pneumonia (chest x-ray showing hazy right infrahilar opacity) and discharged on Augmentin and azithromycin.  Returned to ED with hypoxia, no improvement of oral antibiotics, admitted for up pneumonia treatment, chest x-ray significant with multifocal pneumonia, with significant oxygen requirement, treated with vancomycin, Zosyn and cefepime, started to improve, patient developed multiple episodes of ventricular tachycardia, transferred to heart on 6/18, repeat echo was significant for aortic valve disease and EF at 20%.   Given significant decline PMT was consulted for goals of care conversations.  Past Medical History:  Diagnosis Date   Acute anemia 09/26/2020   Anemia due to acute blood loss 05/25/2019   Chronic maxillary sinusitis 08/06/2017   Coronary artery disease due to lipid rich plaque 07/27/2019   Essential hypertension 02/14/2003   Fainting 05/30/2019   Fall 05/25/1999   High cholesterol 03/14/2001   History of basal cell carcinoma 11/02/2019   History of pneumonia    History of snoring    History of upper respiratory infection    Irregular  heartbeat 08/06/2003   Ischemic cardiomyopathy 05/30/2019   Mild cognitive impairment 05/15/2019   NSTEMI (non-ST elevated myocardial infarction) ([place]) 06/03/2019   Poor renal function 03/19/2005   Primary osteoarthritis of shoulder 10/24/2015   Psoriasis 12/29/2004   Rash and nonspecific skin eruption 04/30/2020   Schatzki's ring 08/07/2020   Sepsis with acute hypoxic respiratory failure ([place]) 09/26/2020   Sleep apnea 08/13/2003    Subjective:   This NP Walden Field reviewed medical records, received report from team, assessed the patient and then meet at the patient's bedside to discuss diagnosis, prognosis, GOC, EOL wishes disposition and options.  I met with the patient at the bedside, although he has difficulty communicating due to his dyspnea.  His daughter was also at the bedside.  She notes the patient's son is on his way from his line and should arrive around 5:00 to 6:00 PM.   Concept of Palliative Care was introduced as specialized medical care for people and their families living with serious illness.  If focuses on providing relief from the symptoms and stress of a serious illness.  The goal is to improve quality of life for both the patient and the family. Values and goals of care important to patient and family were attempted to be elicited.  Created space and opportunity for patient  and family to explore thoughts and feelings regarding current medical situation   Natural trajectory and current clinical status were discussed. Questions and concerns addressed. Patient  encouraged to call with questions or concerns.    Patient/Family Understanding of Illness: The patient's daughter  Ronnie Joseph indicates medical team and nursing staff has been communicating well with her.  She notes the patient came in with pneumonia and sepsis as well as fluid retention and his labs being "off".  She notes he was doing better over the weekend but then on Sunday had episodes of V. tach and increasing  need for higher levels of oxygen.  She notes that he still has a pneumonia and fluid.  They have continued antibiotics and Lasix.  We further discussed his decline in heart function with EF now demonstrated around 20% which is causing his worsening retention of fluid and worsening his respiratory status which is already compromised due to the pneumonia.  She notes that she has seen him for significantly declined and she does not want to see him suffer and struggle.  She notes that he has had a couple bad nights with poor sleep due to discomfort from breathing.  She does note that morphine seems to help.  Life Review: The patient is retired Software engineer.  He has 1 son and 1 daughter.  Patient Values: Family  Goals: To be comfortable and at peace if he is approaching end-of-life (per daughter).  She states she does not want to see him struggle any further.  Today's Discussion: In addition to the discussions described above we had substantial discussion on multiple topics.  We discussed our options including continued full and aggressive care which could include intubation, defibrillation in the event of cardiac arrest.  We also discussed that continuing aggressive care does not mean that he would necessarily improve as he has substantially declined this despite all of our aggressive attempts.  I described the other option as excepting that "some things are not fixable" and shifting to more of a comfort focus.  I described that this is not stopping to care but rather we continue to intensely care for him with a focus on comfort, peace, dignity as he approaches end-of-life.  Ronnie Joseph stated that she likes how that sounds.  I familiarized her with the process of shifting to comfort care including stopping unnecessary medications, CBGs, lab work planning no aggressive escalation, no procedures.  We would start medications such as morphine for pain and dyspnea, Ativan for anxiety and fear with the goal of him  being comfortable as his body shuts down and fails.  She notes that she was going to ask if CBGs and labs could be stopped.  She would somewhat like to hold off until the patient's son Ronnie Joseph arrives from [place] that this evening around dinnertime.  We reached a comfortable understanding where I would stop CBGs and labs but we will continue antibiotics and oxygen.  I described that once they are ready they can transition to full comfort by notifying the nurse either this evening or tomorrow.  Page notes that she would not wait till tomorrow because he has had several bad nights.  I informed her that once we had the morphine drip started for pain and dyspnea that they would work on weaning his oxygen down to room air knowing that this will cause him to desaturate and eventually pass.  She verbalized understanding.  Before I left asked the patient if he was in agreement with everything that was discussed and he shook his head yes.  He denies any needs other than ice water.  I notified the nurse of his request.  I provided emotional and general support through therapeutic listening, empathy, sharing of stories, and other techniques.  Answered all questions  and addressed all concerns to the best of my ability.  Review of Systems  Unable to perform ROS: Acuity of condition    Objective:   Primary Diagnoses: Present on Admission:  Multifocal pneumonia  Acquired hypothyroidism  Atrial fibrillation ([place])  Hypercholesterolemia   Physical Exam Vitals and nursing note reviewed.  Constitutional:      General: He is not in acute distress.    Appearance: He is ill-appearing.  HENT:     Head: Normocephalic and atraumatic.  Cardiovascular:     Rate and Rhythm: Normal rate.  Pulmonary:     Effort: Tachypnea and respiratory distress (mild) present.     Breath sounds: Decreased air movement present.     Comments: Currently on 35L heated high flow AND NRB Abdominal:     General: Abdomen is flat. Bowel  sounds are normal.     Palpations: Abdomen is soft.  Skin:    General: Skin is warm and dry.  Neurological:     Mental Status: He is easily aroused. He is lethargic.     Vital Signs:  BP (!) 100/52   Pulse (!) 53   Temp 99.7 F (37.6 C) (Axillary)   Resp 19   Ht '5\' 10"'  (1.778 m)   Wt 72.4 kg   SpO2 100%   BMI 22.90 kg/m   Palliative Assessment/Data: 10-20% (unable to eat due to O2 requirements)    Advanced Care Planning:   Primary Decision Maker: NEXT OF KIN  Code Status/Advance Care Planning: DNR  A discussion was had today regarding advanced directives. Concepts specific to code status, artifical feeding and hydration, continued IV antibiotics and rehospitalization was had.  The difference between a aggressive medical intervention path and a palliative comfort care path for this patient at this time was had.   Decisions/Changes to ACP: Changed to DNR Anticipate transition to comfort care this evening  Assessment & Plan:   Impression: 88 year old male admitted with pneumonia with a sudden decompensation in heart function with EF now 20% and subsequent fluid retention.  This is worsening his cardiopulmonary status and he likely does not have any reserve left.  He continues to have pneumonia and is being treated with antibiotics.  His oxygen requirements have increased substantially and he is currently on 35 L of heated high flow in addition to a nonrebreather on top of this.  He has had several days of struggle and his daughter expresses she does not want to see him struggle anymore.  Once his son arrives from [place] they will transition to comfort focus.  Overall prognosis grave  SUMMARY OF RECOMMENDATIONS   Changed to DNR Continue antibiotics and oxygen until son arrives Stop CBGs and labs as well as insulin Comfort orders entered to be available when family elects to transition to comfort Wean oxygen to room air once comfort care initiated and morphine on  board Palliative medicine will continue to follow  Symptom Management:  Currently/before transition to full comfort: Morphine 1 g IV every 3 hours as needed Biotene oral rinse 15 mL as needed dry mouth Carmax lip balm as needed for lip care After transition to comfort: Biotene oral rinse 15 mL as needed dry mouth Robinul 0.2 mg IV every 4 hours for excessive secretions Haldol 0.5 mg every 4 hours as needed agitation or delirium Carmax lip balm as needed for lip care Morphine drip 2 to 10 mg/h titrate for pain, dyspnea Morphine bolus via infusion 2 mg IV every 15 minutes as needed pain,  dyspnea Zofran 4 mg IV every 6 hours as needed nausea/vomiting Polyvinyl 1.4% ophthalmic 1 drop 4 times daily as needed  Prognosis:  Hours - Days  Discharge Planning:  Anticipated Hospital Death   Discussed with: Medical team, nursing team, patient, patient's family    Thank you for allowing [place] to participate in the care of Ronnie Joseph PMT will continue to support holistically.  Billing based on MDM: High  Problems Addressed: One acute or chronic illness or injury that poses a threat to life or bodily function  Amount and/or Complexity of Data: Category 3:Discussion of management or test interpretation with external physician/other qualified health care professional/appropriate source (not separately reported)  Risks: Parenteral controlled substances   Signed by: Walden Field, NP Palliative Medicine Team  Team Phone # [phone] (Nights/Weekends)  03/31/2022, 1:12 PM

## 2022-03-31 NOTE — Progress Notes (Signed)
Pt wanted to come off Bipap. Pt placed back on previous HFNC settings of 35L 100% with Sp02 in the 80's, NRB added for additional 02. Sp02 96%.

## 2022-03-31 NOTE — Progress Notes (Signed)
OT Cancellation Note  Patient Details Name: Tramar Brueckner MRN: [id number] DOB: Mar 21, 1932   Cancelled Treatment:    Reason Eval/Treat Not Completed: Patient not medically ready. Discussed with nsg. Pt with increased O2 requirements and nsg states pt is "finally resting". Palliative has been consulted. Will check back another time if pt able to tolerate.    Ronnita Paz,H[place] 03/31/2022, 8:48 AM Maurie Boettcher, OT/L   Acute OT Clinical Specialist Acute Rehabilitation Services Pager [phone] Office [phone]

## 2022-03-31 NOTE — Plan of Care (Signed)

## 2022-04-01 ENCOUNTER — Telehealth: Payer: Self-pay

## 2022-04-01 DIAGNOSIS — Z515 Encounter for palliative care: Secondary | ICD-10-CM

## 2022-04-03 LAB — HCV INTERPRETATION

## 2022-04-03 LAB — HCV AB W REFLEX TO QUANT PCR: HCV Ab: NONREACTIVE

## 2022-04-11 NOTE — Death Summary Note (Signed)
DEATH SUMMARY   Patient Details  Name: Ronnie Joseph MRN: [id number] DOB: 1932/09/11 DVV:OHYWV, Mervyn Gay, NP Admission/Discharge Information   Admit Date:  April 01, 2022  Date of Death: Date of Death: April 11, 2022  Time of Death: Time of Death: 0323  Length of Stay: 9   Principle Cause of death:   Acute respiratory failure with hypoxia Due to -sepsis from multifocal pneumonia - Acute on chronic systolic CHF - Ventricular tachycardia  Hospital Diagnoses: Principal Problem:   Multifocal pneumonia Active Problems:   End of life care   Severe sepsis ([place])   Acute respiratory failure with hypoxia ([place])   Ischemic cardiomyopathy   Ventricular tachycardia ([facility])   Acute on chronic systolic heart failure ([facility])   Atrial fibrillation ([facility])   Hypokalemia   Acquired hypothyroidism   Hypercholesterolemia   Mild cognitive impairment   prolong QTc   Severe MR    AKI on CKD stage III AA   Non-insulin-dependent diabetes mellitus type 2   GERD   Chronic blood loss anemia   Iron deficiency anemia   Generalized weakness   Failure to thrive   Hospital Course:  Ronnie Joseph is a 88 y.o. male with medical history significant of CAD status post PCI 05/2019, ischemic cardiomyopathy, paroxysmal A-fib on Eliquis, hypertension, hyperlipidemia, mild cognitive impairment, type 2 diabetes, hypothyroidism, OSA, chronic cough, GERD, iron deficiency anemia due to chronic blood loss from AVMs and GI advised against repeat endoscopic procedures given his age. Recently seen in the ED on 6/6 for fevers, chills, and generalized weakness.  He was diagnosed with pneumonia (chest x-ray showing hazy right infrahilar opacity) and discharged on Augmentin and azithromycin. -Returned to ED with hypoxia, no improvement of oral antibiotics, admitted for up pneumonia treatment, chest x-ray significant with multifocal pneumonia, with significant oxygen requirement, with  imaging showing worsening of pneumonia, currently  multifocal pneumonia, he was treated with IV cefepime, azithromycin and vancomycin, patient facility, and age, multiple comorbidities, he has poor response, so IV steroids were added, patient with significant oxygen requirement, on high flow nasal cannula, patient with known history of ischemic cardiomyopathy, and low EF, patient developed multiple episodes of ventricular tachycardia, he did not require any defibrillation or cardioversion, but he was transferred to CCU for close observation and possible need for defibrillation, he was started on amiodarone drip, 2D echo was obtained which showed significant worsening of his EF to 20%, as well did show global hypokinesis with new severe MR, patient extremely frail, deconditioned, with multiple comorbidities, uncomfortable due to his hypoxia and CHF, palliative medicine were involved, patient/family were clear about no heroics, or aggressive intervention, family decided to proceed with comfort care measures, comfort measures were managed closely by palliative medicine, patient passed away 11-Apr-2021 at 3:23 AM.          Consultations:  Cardiology Palliative medicine  The results of significant diagnostics from this hospitalization (including imaging, microbiology, ancillary and laboratory) are listed below for reference.   Significant Diagnostic Studies: DG CHEST PORT 1 VIEW  Result Date: 03/30/2022 CLINICAL DATA:  Shortness of breath EXAM: PORTABLE CHEST 1 VIEW COMPARISON:  03/26/2022 FINDINGS: Cardiac shadow is enlarged but stable. Aortic calcifications are noted. Lungs are well aerated bilaterally. Previously seen multifocal pneumonia is again identified. Significant increased central vascular congestion is noted without edema consistent with superimposed CHF. No sizable effusion is noted. No acute bony abnormality is seen. Old fourth right rib fracture is noted. IMPRESSION: Stable multifocal pneumonia. Superimposed central vascular congestion  Electronically Signed  By: Inez Catalina M.D.   On: 03/30/2022 22:27   ECHOCARDIOGRAM LIMITED  Result Date: 03/29/2022    ECHOCARDIOGRAM LIMITED REPORT   Patient Name:   JAHKARI MACLIN Date of Exam: 03/29/2022 Medical Rec #:  [id number]   Height:       68.0 in Accession #:    [id number]  Weight:       160.7 lb Date of Birth:  August 23, 1932   BSA:          1.862 m Patient Age:    26 years    BP:           136/71 mmHg Patient Gender: M           HR:           96 bpm. Exam Location:  Inpatient Procedure: Limited Echo and Limited Color Doppler STAT ECHO Indications:    Ventricular tachycardia  History:        Patient has prior history of Echocardiogram examinations, most                 recent 07/15/2021.  Sonographer:    Joette Catching RCS Referring Phys: [phone] TIFFANY Breckenridge IMPRESSIONS  1. Left ventricular ejection fraction, by estimation, is 20%. The left ventricle has severely decreased function. The left ventricle demonstrates global hypokinesis. No LV thrombus seen on non contrast study. Mildly dilated left ventricle.  2. Right ventricular systolic function is moderately reduced. There is mid and apical hypokinesis but preserved basal function. Normal size and thickness. RVSP 52 mm Hg.  3. Large pleural effusion in the left lateral region.  4. The mitral valve is degenerative. Severe mitral valve regurgitation Ventricular functional etiology.     Moderate to severe mitral annular calcification  5. Tricuspid valve regurgitation is mild to moderate.  6. The aortic valve is tricuspid. There is mild calcification of the aortic valve. There is mild thickening of the aortic valve. Aortic valve regurgitation is mild. Aortic valve sclerosis is present, with no evidence of aortic valve stenosis.  7. The inferior vena cava is dilated in size with >50% respiratory variability, suggesting right atrial pressure of 8 mmHg.  8. Left atrial size was moderately dilated.  9. Right atrial size was mildly dilated. Comparison(s):  Significant change in LVEF, mitral regurgitation, and LV size from prior. FINDINGS  Left Ventricle: Papillary muscle hypertrophy. Left ventricular ejection fraction, by estimation, is 20%. The left ventricle has severely decreased function. The left ventricle demonstrates global hypokinesis. The left ventricular internal cavity size was mildly dilated. Right Ventricle: The right ventricular size is normal. No increase in right ventricular wall thickness. Right ventricular systolic function is moderately reduced. There is moderately elevated pulmonary artery systolic pressure. The tricuspid regurgitant velocity is 3.30 m/s, and with an assumed right atrial pressure of 8 mmHg, the estimated right ventricular systolic pressure is 26.9 mmHg. Left Atrium: Left atrial size was moderately dilated. Right Atrium: Right atrial size was mildly dilated. Pericardium: Trivial pericardial effusion is present. Mitral Valve: The mitral valve is degenerative in appearance. Moderate to severe mitral annular calcification. Severe mitral valve regurgitation. Tricuspid Valve: Tricuspid valve regurgitation is mild to moderate. Aortic Valve: The aortic valve is tricuspid. There is mild calcification of the aortic valve. There is mild thickening of the aortic valve. There is mild aortic valve annular calcification. Aortic valve regurgitation is mild. Aortic regurgitation PHT measures 386 msec. Aortic valve sclerosis is present, with no evidence of aortic valve stenosis. Pulmonic Valve: The pulmonic valve  was normal in structure. Pulmonic valve regurgitation is trivial. No evidence of pulmonic stenosis. Venous: The inferior vena cava is dilated in size with greater than 50% respiratory variability, suggesting right atrial pressure of 8 mmHg. Additional Comments: There is a large pleural effusion in the left lateral region. LEFT VENTRICLE PLAX 2D LVIDd:         6.00 cm LVIDs:         5.50 cm LV PW:         1.10 cm LV IVS:        0.90 cm  LV  Volumes (MOD) LV vol d, MOD A2C: 150.0 ml LV vol d, MOD A4C: 135.0 ml LV vol s, MOD A2C: 105.0 ml LV vol s, MOD A4C: 117.0 ml LV SV MOD A2C:     45.0 ml LV SV MOD A4C:     135.0 ml LV SV MOD BP:      29.5 ml RIGHT VENTRICLE             IVC RV S prime:     12.20 cm/s  IVC diam: 2.30 cm TAPSE (M-mode): 1.4 cm AORTIC VALVE          PULMONIC VALVE AI PHT:      386 msec PR End Diast Vel: 7.29 msec  MR Peak grad: 101.6 mmHg  TRICUSPID VALVE MR Mean grad: 65.0 mmHg   TR Peak grad:   43.6 mmHg MR Vmax:      504.00 cm/s TR Vmax:        330.00 cm/s MR Vmean:     384.0 cm/s Rudean Haskell MD Electronically signed by Rudean Haskell MD Signature Date/Time: 03/29/2022/4:59:31 PM    Final    CT CHEST WO CONTRAST  Result Date: 03/27/2022 CLINICAL DATA:  Hypoxia EXAM: CT CHEST WITHOUT CONTRAST TECHNIQUE: Multidetector CT imaging of the chest was performed following the standard protocol without IV contrast. RADIATION DOSE REDUCTION: This exam was performed according to the departmental dose-optimization program which includes automated exposure control, adjustment of the mA and/or kV according to patient size and/or use of iterative reconstruction technique. COMPARISON:  Chest x-ray 03/26/2022, CT 01/05/2022 FINDINGS: Cardiovascular: Mild cardiomegaly. No pericardial effusion. Thoracic aorta is nonaneurysmal. Atherosclerotic calcification of the aorta and coronary arteries. Main pulmonary trunk is mildly dilated. Mediastinum/Nodes: Interval development of numerous mildly prominent mediastinal and bilateral hilar lymph nodes including 10 mm upper right paratracheal node (series 3, image 39). No axillary lymphadenopathy. Thyroid, trachea, and esophagus demonstrate no significant findings. Lungs/Pleura: Multifocal bilateral airspace consolidations, most confluent within the right upper lobe with air bronchograms. Predominantly ground-glass opacities within the remaining lung fields. Moderate right and small left pleural  effusions. Dependent left lower lobe atelectasis. No pneumothorax. Upper Abdomen: No acute abnormality. Musculoskeletal: Chronic mild superior endplate compression fractures of T2 and T3, unchanged. Severe degenerative disc disease of C6-7. Severe bilateral glenohumeral joint arthropathy. No new or acute bony findings. No chest wall abnormality. IMPRESSION: 1. Appearance compatible with multifocal pneumonia, most confluent within the right upper lobe. 2. Moderate right and small left pleural effusions. 3. Interval development of numerous mildly prominent mediastinal and bilateral hilar lymph nodes, likely reactive. 4. Aortic and coronary artery atherosclerosis (ICD10-I70.0). 5. Mildly dilated main pulmonary trunk, which can be seen in the setting of pulmonary arterial hypertension. Electronically Signed   By: Davina Poke D.O.   On: 03/27/2022 10:12   DG Chest Port 1 View  Result Date: 03/26/2022 CLINICAL DATA:  Shortness of breath, hypoxia, pneumonia EXAM: PORTABLE  CHEST 1 VIEW COMPARISON:  Previous studies including the examination of 03/24/2022 FINDINGS: Transverse diameter of heart is increased. There is interval decrease in interstitial markings in the parahilar regions. There are patchy infiltrates in the right upper lung fields right lower lung fields and both parahilar regions. There is slight interval decrease in infiltrate in the right upper lung fields and slight worsening of infiltrate in the right lower lung fields. Lateral CP angles are clear. There is no pneumothorax. Degenerative changes are noted in both shoulders, more severe on the right side. IMPRESSION: Infiltrates are seen in both lungs with interval slight decrease in infiltrate in the right upper lung fields and slight worsening of infiltrate in the right lower lung fields. Findings suggest multifocal pneumonia involving both lungs. There is decrease in interstitial markings in the parahilar regions suggesting possible resolving  interstitial pulmonary edema. Electronically Signed   By: Elmer Picker M.D.   On: 03/26/2022 16:17   DG Chest Port 1 View  Result Date: 03/24/2022 CLINICAL DATA:  Acute respiratory distress. EXAM: PORTABLE CHEST 1 VIEW COMPARISON:  PA Lat 03/15/2022. FINDINGS: The heart is enlarged.  There is a LAD coronary artery stent. There is aortic atherosclerosis with stable mediastinal configuration. There is increased perihilar vascular congestion and interstitial edema. Superimposed patchy consolidation in the right upper lobe is again seen and worsened which could be superimposed pneumonia, airspace edema or combination. There is increased hazy opacity in the lower lung fields which could be ground-glass edema, atelectasis or pneumonitis and small pleural effusions are also increased. Osteopenia. Advanced right-greater-than-left shoulder DJD again noted with degenerative change of the spine. IMPRESSION: 1. Increased vascular congestion and interstitial edema consistent with CHF or fluid overload. 2. Increased small pleural effusions. 3. Increased haziness in the lower zones consistent with atelectasis, ground-glass edema or pneumonitis. 4. Continued right upper lobe patchy consolidation. Electronically Signed   By: Telford Nab M.D.   On: 03/24/2022 01:59   DG Chest 2 View  Result Date: 03/15/2022 CLINICAL DATA:  Shortness of breath, pneumonia EXAM: CHEST - 2 VIEW COMPARISON:  03/17/2022 FINDINGS: Cardiomegaly. New, heterogeneous airspace opacity most conspicuously present in the right upper lobe although likely bilateral. Small bilateral pleural effusions. Disc degenerative disease of the thoracic spine. IMPRESSION: 1. New, heterogeneous airspace opacity most conspicuously present in the right upper lobe although likely bilateral. Small bilateral pleural effusions. Findings are consistent with multifocal infection. 2. Cardiomegaly. Electronically Signed   By: Delanna Ahmadi M.D.   On: 03/28/2022 19:27    DG Chest 2 View  Result Date: 03/17/2022 CLINICAL DATA:  Fever EXAM: CHEST - 2 VIEW COMPARISON:  01/05/2022 FINDINGS: Stable mild cardiomegaly. Aortic atherosclerosis. Hazy right infrahilar opacity. No pleural effusion or pneumothorax. 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 IMPRESSION: Hazy right infrahilar opacity could reflect atelectasis or pneumonia. Electronically Signed   By: Davina Poke D.O.   On: 03/17/2022 09:26    Microbiology: Recent Results (from the past 240 hour(s))  Blood culture (routine x 2)     Status: None   Collection Time: 03/23/22  3:44 AM   Specimen: BLOOD  Result Value Ref Range Status   Specimen Description BLOOD SITE NOT SPECIFIED  Final   Special Requests   Final    BOTTLES DRAWN AEROBIC AND ANAEROBIC Blood Culture adequate volume   Culture   Final    NO GROWTH 5 DAYS Performed at [facility], [address], [place], [place] [address]  Report Status 03/28/2022 FINAL  Final  Blood culture (routine x 2)     Status: None   Collection Time: 03/23/22  3:49 AM   Specimen: BLOOD  Result Value Ref Range Status   Specimen Description BLOOD SITE NOT SPECIFIED  Final   Special Requests   Final    BOTTLES DRAWN AEROBIC AND ANAEROBIC Blood Culture adequate volume   Culture   Final    NO GROWTH 5 DAYS Performed at [facility], [address], [place], [place] [address]    Report Status 03/28/2022 FINAL  Final  SARS Coronavirus 2 by RT PCR (hospital order, performed in [facility] hospital lab) *cepheid single result test*     Status: None   Collection Time: 03/23/22  9:27 AM  Result Value Ref Range Status   SARS Coronavirus 2 by RT PCR NEGATIVE NEGATIVE Final    Comment: (NOTE) SARS-CoV-2 target nucleic acids are NOT DETECTED.  The SARS-CoV-2 RNA is generally detectable in upper and lower respiratory specimens during the acute phase of infection. The lowest concentration of SARS-CoV-2  viral copies this assay can detect is 250 copies / mL. A negative result does not preclude SARS-CoV-2 infection and should not be used as the sole basis for treatment or other patient management decisions.  A negative result may occur with improper specimen collection / handling, submission of specimen other than nasopharyngeal swab, presence of viral mutation(s) within the areas targeted by this assay, and inadequate number of viral copies (<250 copies / mL). A negative result must be combined with clinical observations, patient history, and epidemiological information.  Fact Sheet for Patients:   https://www.patel.info/  Fact Sheet for Healthcare Providers: https://hall.com/  This test is not yet approved or  cleared by the [place] FDA and has been authorized for detection and/or diagnosis of SARS-CoV-2 by FDA under an Emergency Use Authorization (EUA).  This EUA will remain in effect (meaning this test can be used) for the duration of the COVID-19 declaration under Section 564(b)(1) of the Act, 21 U.S.C. section 360bbb-3(b)(1), unless the authorization is terminated or revoked sooner.  Performed at [facility], [place] [address] [place], [place] [address]   MRSA Next Gen by PCR, Nasal     Status: None   Collection Time: 03/23/22  9:27 AM  Result Value Ref Range Status   MRSA by PCR Next Gen NOT DETECTED NOT DETECTED Final    Comment: (NOTE) The GeneXpert MRSA Assay (FDA approved for NASAL specimens only), is one component of a comprehensive MRSA colonization surveillance program. It is not intended to diagnose MRSA infection nor to guide or monitor treatment for MRSA infections. Test performance is not FDA approved in patients less than 28 years old. Performed at [facility], [place] [address], [place], [place] [address]       Signed: Phillips Climes, MD 03-Apr-2022

## 2022-04-11 NOTE — Telephone Encounter (Signed)
Transition Care Management Unsuccessful Follow-up Telephone Call  Date of discharge and from where:  04/23/22  Attempts:  1st Attempt, no call made  Reason for unsuccessful TCM follow-up call:  Unable to reach patient due to patient expiration.

## 2022-04-11 DEATH — deceased

## 2022-04-23 ENCOUNTER — Encounter: Payer: Medicare Other | Admitting: Orthopedic Surgery

## 2022-04-30 ENCOUNTER — Ambulatory Visit: Payer: Medicare Other | Admitting: Orthopedic Surgery

## 2022-06-18 ENCOUNTER — Ambulatory Visit: Payer: Medicare Other | Admitting: Orthopedic Surgery

## 2022-06-24 ENCOUNTER — Ambulatory Visit: Payer: Medicare Other | Admitting: Cardiology

## 2022-08-07 IMAGING — CT CT CHEST W/O CM
2 of 4 series · 14 of 36 positions shown, 17 images · non-contrast
Comparison: Chest x-ray 03/26/2022, CT 01/05/2022

CLINICAL DATA: Hypoxia



[Series 3: chest wo · axial · 0.67mm/px · z∈[+1335,+1617]mm · 11 of 167 slices shown, 14 images]
[im 13/167  mediastinal]
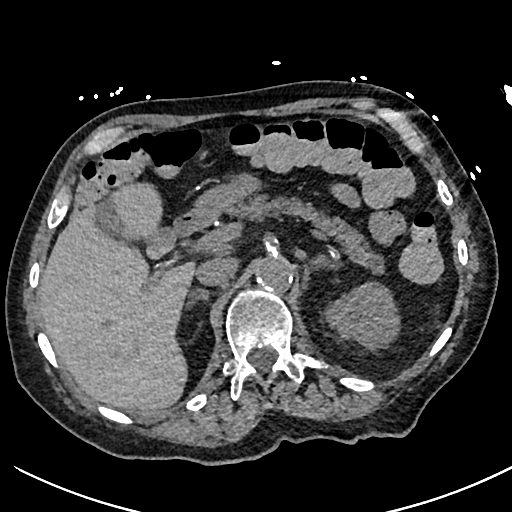
[im 13/167  lung]
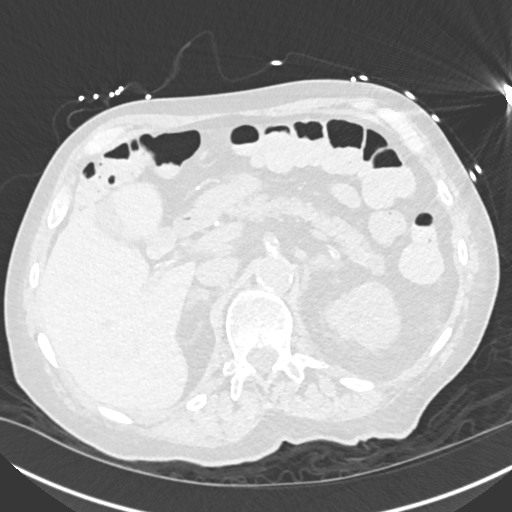
[im 26/167  lung]
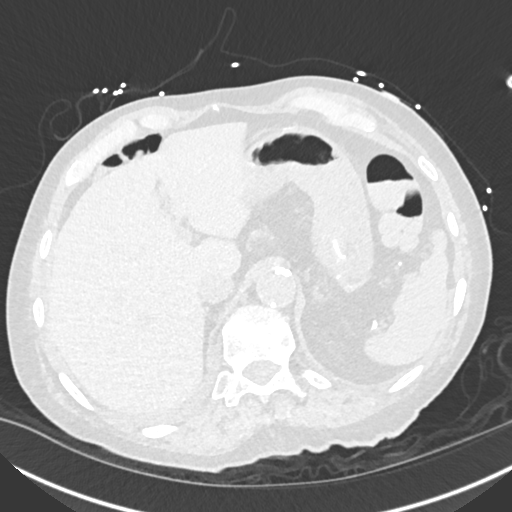
[im 39/167  lung]
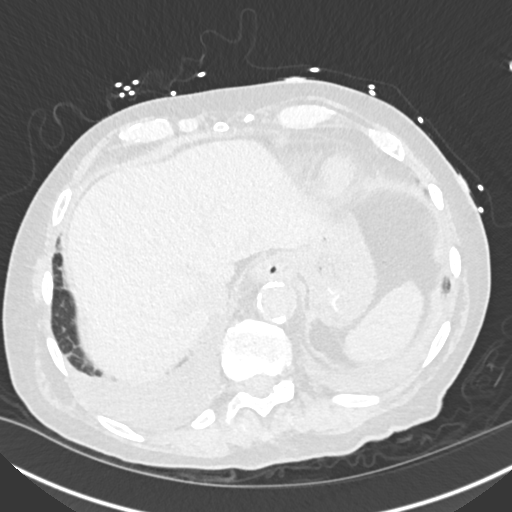
[im 52/167  lung]
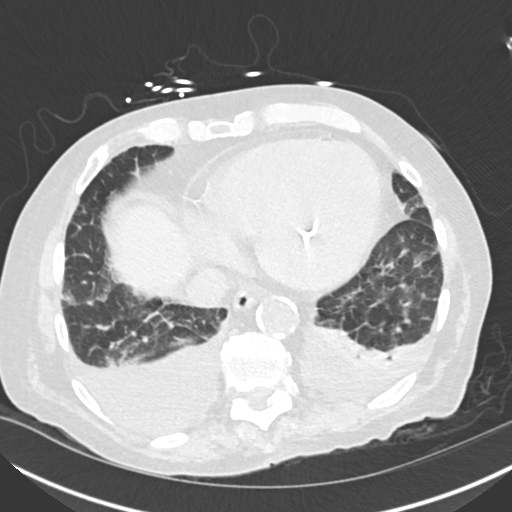
[im 64/167  mediastinal]
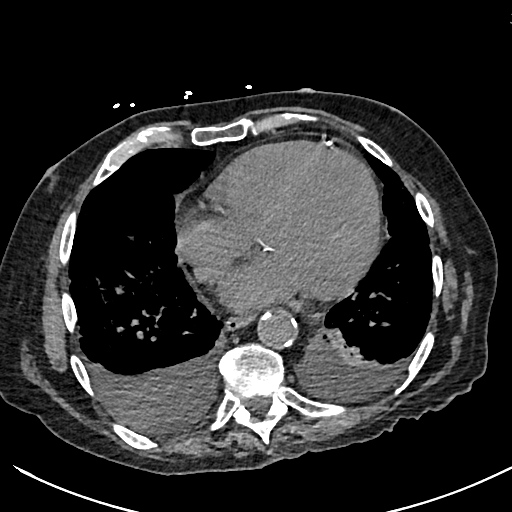
[im 64/167  lung]
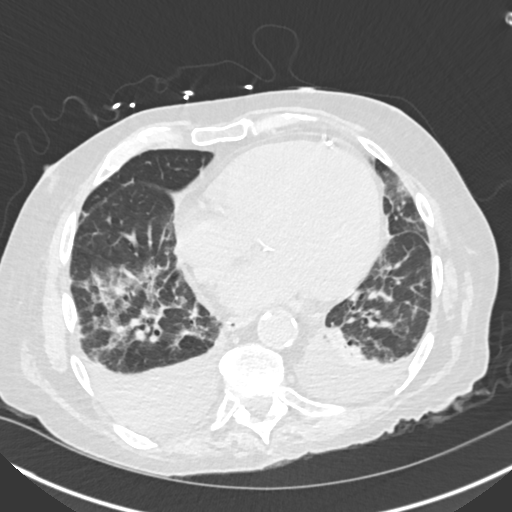
[im 90/167  lung]
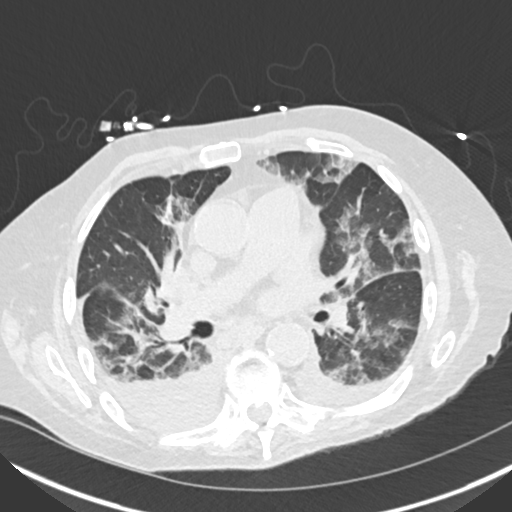
[im 103/167  lung]
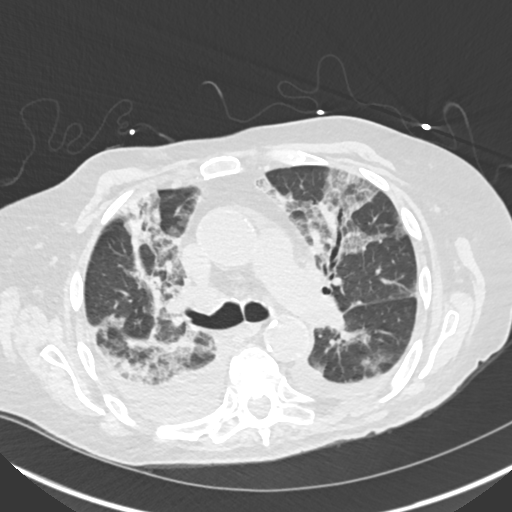
[im 115/167  lung]
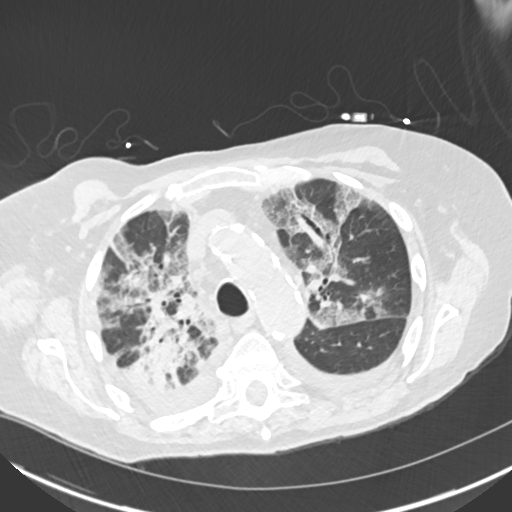
[im 128/167  mediastinal]
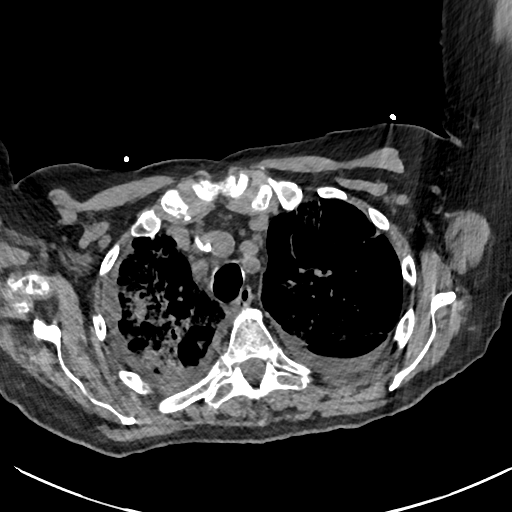
[im 128/167  lung]
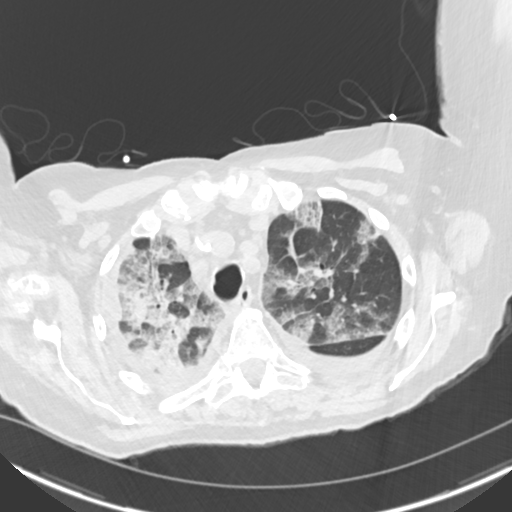
[im 141/167  lung]
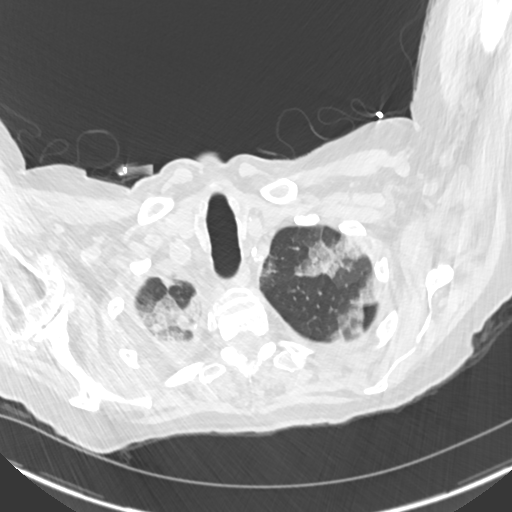
[im 154/167  lung]
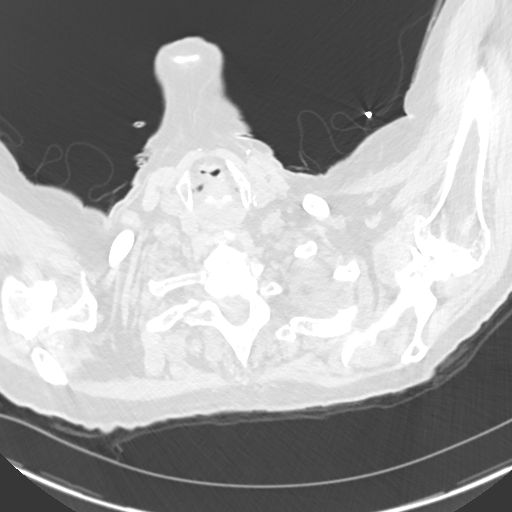

[Series 6: cor · coronal · 0.64mm/px · 3 of 155 slices shown]
[im 31/155  lung]
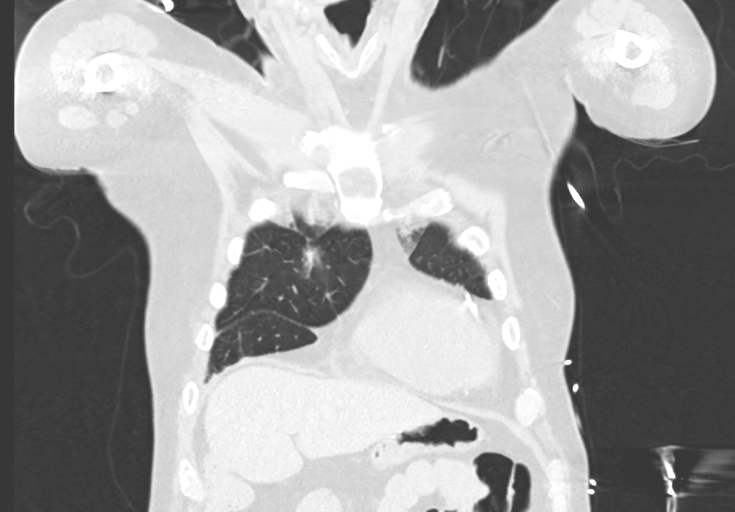
[im 62/155  lung]
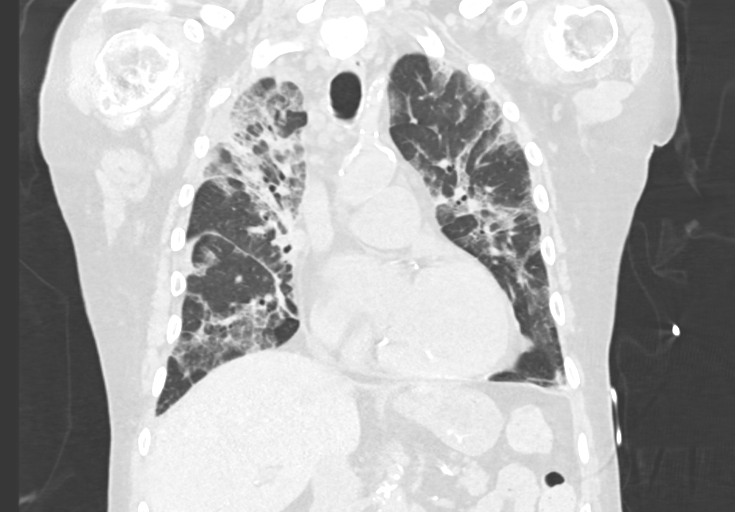
[im 93/155  lung]
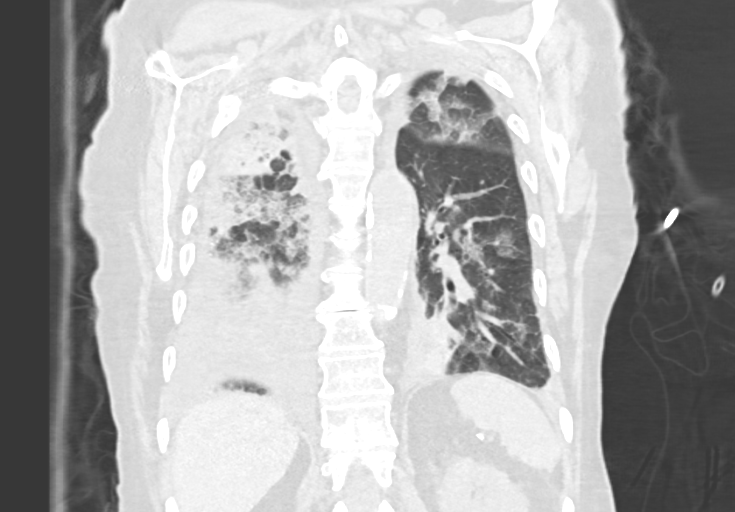

[14 of 36 positions shown; findings below may reference images not displayed]

FINDINGS: Cardiovascular: Mild cardiomegaly. No pericardial effusion. Thoracic
aorta is nonaneurysmal. Atherosclerotic calcification of the aorta
and coronary arteries. Main pulmonary trunk is mildly dilated.

Mediastinum/Nodes: Interval development of numerous mildly prominent
mediastinal and bilateral hilar lymph nodes including 10 mm upper
right paratracheal node (series 3, image 39). No axillary
lymphadenopathy. Thyroid, trachea, and esophagus demonstrate no
significant findings.

Lungs/Pleura: Multifocal bilateral airspace consolidations, most
confluent within the right upper lobe with air bronchograms.
Predominantly ground-glass opacities within the remaining lung
fields. Moderate right and small left pleural effusions. Dependent
left lower lobe atelectasis. No pneumothorax.

Upper Abdomen: No acute abnormality.

Musculoskeletal: Chronic mild superior endplate compression
fractures of T2 and T3, unchanged. Severe degenerative disc disease
of C6-7. Severe bilateral glenohumeral joint arthropathy. No new or
acute bony findings. No chest wall abnormality.
IMPRESSION: 1. Appearance compatible with multifocal pneumonia, most confluent
within the right upper lobe.
2. Moderate right and small left pleural effusions.
3. Interval development of numerous mildly prominent mediastinal and
bilateral hilar lymph nodes, likely reactive.
4. Aortic and coronary artery atherosclerosis ([id number]).
5. Mildly dilated main pulmonary trunk, which can be seen in the
setting of pulmonary arterial hypertension.

## 2022-08-10 IMAGING — DX DG CHEST 1V PORT
1 series · 1 of 1 positions shown · non-contrast
Comparison: 03/26/2022

CLINICAL DATA: Shortness of breath

EXAM:
PORTABLE CHEST 1 VIEW

[chest]
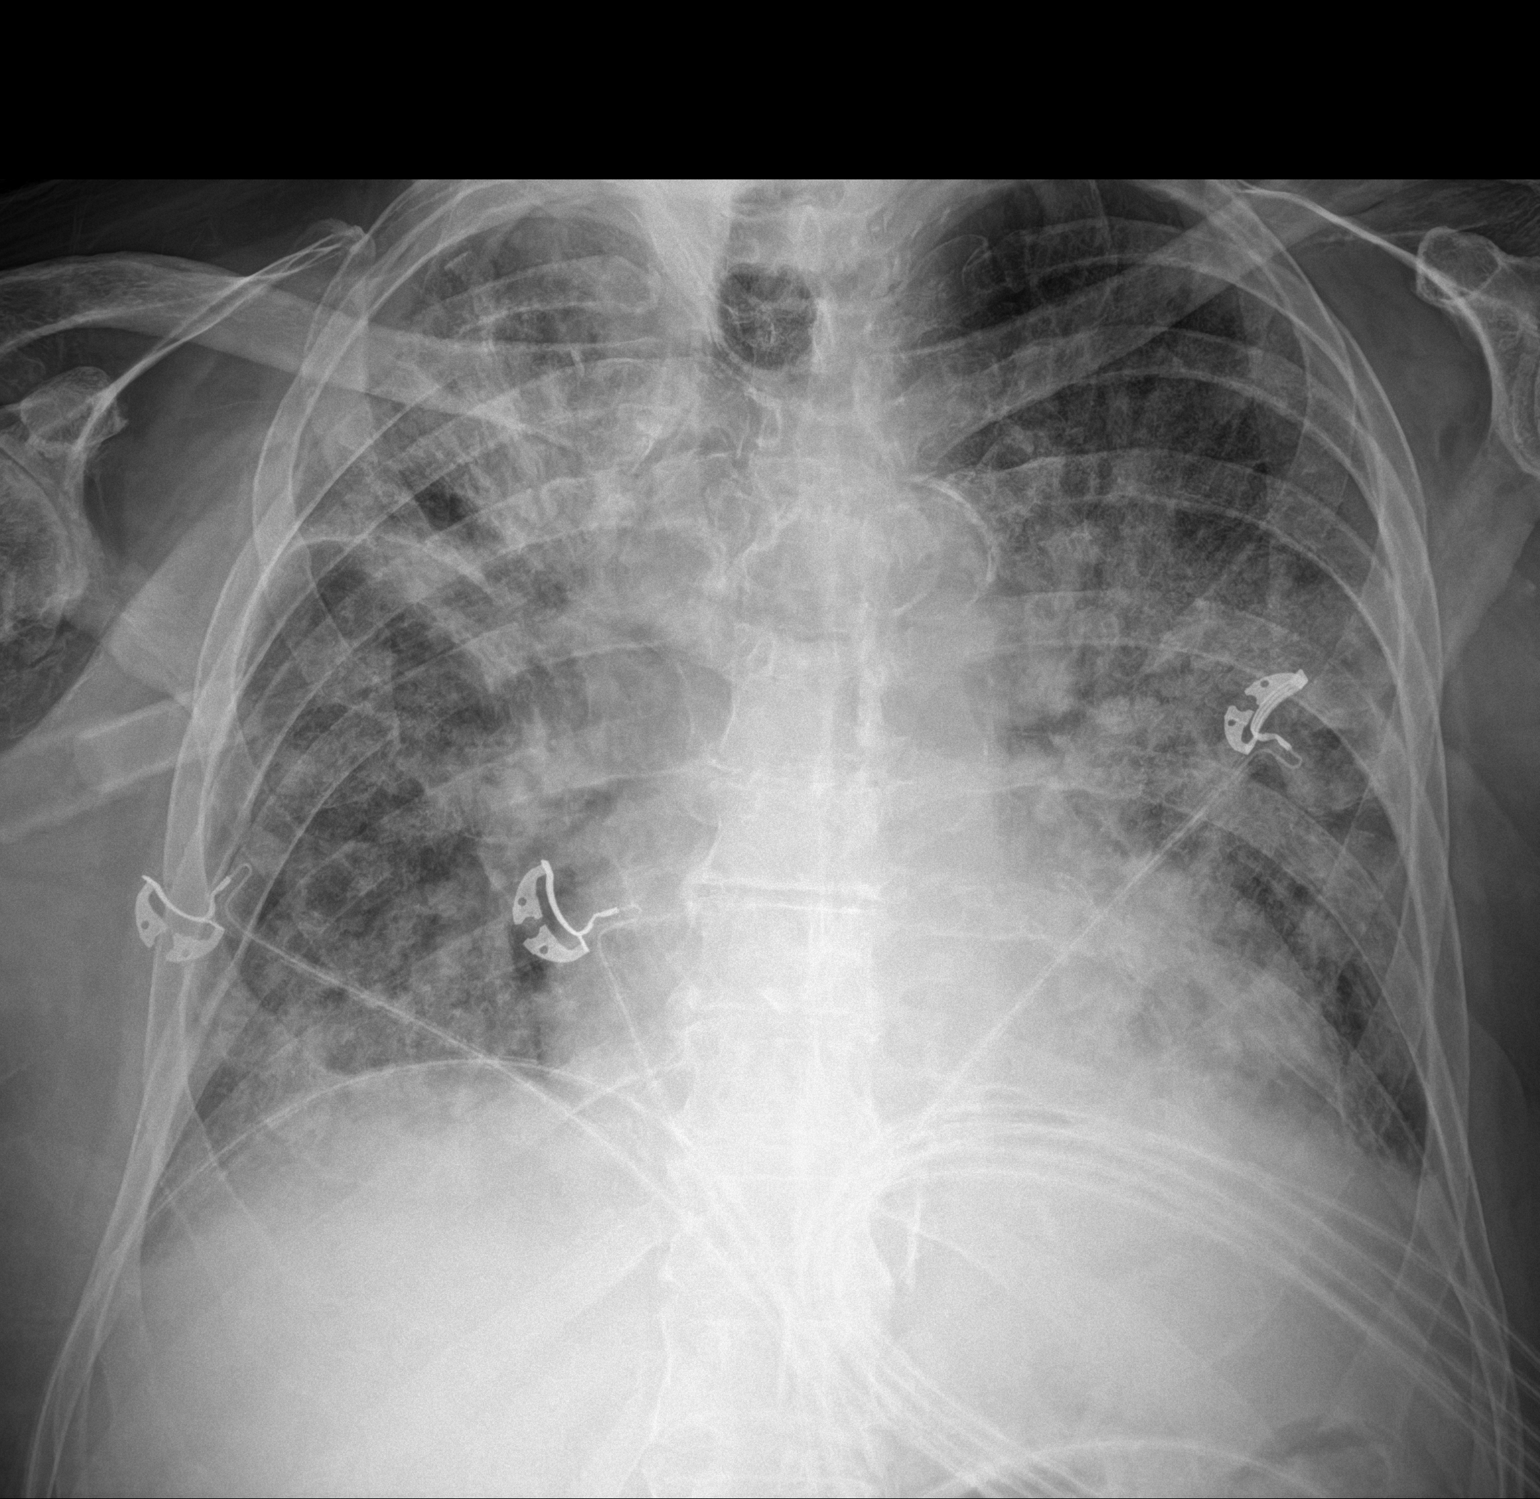

[1 of 1 positions shown; findings below may reference images not displayed]

FINDINGS: Cardiac shadow is enlarged but stable. Aortic calcifications are
noted. Lungs are well aerated bilaterally. Previously seen
multifocal pneumonia is again identified. Significant increased
central vascular congestion is noted without edema consistent with
superimposed CHF. No sizable effusion is noted. No acute bony
abnormality is seen. Old fourth right rib fracture is noted.
IMPRESSION: Stable multifocal pneumonia.

Superimposed central vascular congestion

## 2022-09-07 ENCOUNTER — Ambulatory Visit: Payer: Medicare Other | Admitting: Hematology and Oncology

## 2022-09-07 ENCOUNTER — Other Ambulatory Visit: Payer: Medicare Other
